# Patient Record
Sex: Female | Born: 1955 | Race: White | Hispanic: No | Smoking: Former smoker
Health system: Southern US, Community
[De-identification: ages and names within clinical notes are randomized; demographics above are authoritative.]

## PROBLEM LIST (undated history)

## (undated) DIAGNOSIS — Z720 Tobacco use: Secondary | ICD-10-CM

## (undated) DIAGNOSIS — M1990 Unspecified osteoarthritis, unspecified site: Secondary | ICD-10-CM

## (undated) DIAGNOSIS — T4145XA Adverse effect of unspecified anesthetic, initial encounter: Secondary | ICD-10-CM

## (undated) DIAGNOSIS — E278 Other specified disorders of adrenal gland: Secondary | ICD-10-CM

## (undated) DIAGNOSIS — E119 Type 2 diabetes mellitus without complications: Secondary | ICD-10-CM

## (undated) DIAGNOSIS — M797 Fibromyalgia: Secondary | ICD-10-CM

## (undated) DIAGNOSIS — R200 Anesthesia of skin: Secondary | ICD-10-CM

## (undated) DIAGNOSIS — I341 Nonrheumatic mitral (valve) prolapse: Secondary | ICD-10-CM

## (undated) DIAGNOSIS — F419 Anxiety disorder, unspecified: Secondary | ICD-10-CM

## (undated) DIAGNOSIS — E559 Vitamin D deficiency, unspecified: Secondary | ICD-10-CM

## (undated) DIAGNOSIS — R42 Dizziness and giddiness: Secondary | ICD-10-CM

## (undated) DIAGNOSIS — F410 Panic disorder [episodic paroxysmal anxiety] without agoraphobia: Secondary | ICD-10-CM

## (undated) DIAGNOSIS — T8859XA Other complications of anesthesia, initial encounter: Secondary | ICD-10-CM

## (undated) DIAGNOSIS — K8020 Calculus of gallbladder without cholecystitis without obstruction: Secondary | ICD-10-CM

## (undated) DIAGNOSIS — E039 Hypothyroidism, unspecified: Secondary | ICD-10-CM

## (undated) DIAGNOSIS — R202 Paresthesia of skin: Secondary | ICD-10-CM

## (undated) DIAGNOSIS — I493 Ventricular premature depolarization: Secondary | ICD-10-CM

## (undated) DIAGNOSIS — C801 Malignant (primary) neoplasm, unspecified: Secondary | ICD-10-CM

## (undated) DIAGNOSIS — Z8719 Personal history of other diseases of the digestive system: Secondary | ICD-10-CM

## (undated) DIAGNOSIS — K219 Gastro-esophageal reflux disease without esophagitis: Secondary | ICD-10-CM

## (undated) DIAGNOSIS — D496 Neoplasm of unspecified behavior of brain: Secondary | ICD-10-CM

## (undated) DIAGNOSIS — Z973 Presence of spectacles and contact lenses: Secondary | ICD-10-CM

## (undated) DIAGNOSIS — R351 Nocturia: Secondary | ICD-10-CM

## (undated) DIAGNOSIS — I10 Essential (primary) hypertension: Secondary | ICD-10-CM

## (undated) DIAGNOSIS — R569 Unspecified convulsions: Secondary | ICD-10-CM

## (undated) DIAGNOSIS — Z8489 Family history of other specified conditions: Secondary | ICD-10-CM

## (undated) HISTORY — DX: Type 2 diabetes mellitus without complications: E11.9

## (undated) HISTORY — DX: Fibromyalgia: M79.7

## (undated) HISTORY — DX: Vitamin D deficiency, unspecified: E55.9

## (undated) HISTORY — DX: Hypothyroidism, unspecified: E03.9

## (undated) HISTORY — PX: OTHER SURGICAL HISTORY: SHX169

## (undated) HISTORY — DX: Anxiety disorder, unspecified: F41.9

## (undated) HISTORY — DX: Nonrheumatic mitral (valve) prolapse: I34.1

## (undated) HISTORY — PX: MULTIPLE TOOTH EXTRACTIONS: SHX2053

---

## 1998-01-03 ENCOUNTER — Other Ambulatory Visit: Admission: RE | Admit: 1998-01-03 | Discharge: 1998-01-03 | Payer: Self-pay | Admitting: Obstetrics and Gynecology

## 1999-01-05 ENCOUNTER — Other Ambulatory Visit: Admission: RE | Admit: 1999-01-05 | Discharge: 1999-01-05 | Payer: Self-pay | Admitting: Obstetrics and Gynecology

## 2000-01-25 ENCOUNTER — Other Ambulatory Visit: Admission: RE | Admit: 2000-01-25 | Discharge: 2000-01-25 | Payer: Self-pay | Admitting: Obstetrics and Gynecology

## 2000-03-01 ENCOUNTER — Emergency Department (HOSPITAL_COMMUNITY): Admission: EM | Admit: 2000-03-01 | Discharge: 2000-03-01 | Payer: Self-pay | Admitting: Emergency Medicine

## 2000-03-01 ENCOUNTER — Encounter: Payer: Self-pay | Admitting: Emergency Medicine

## 2000-12-08 ENCOUNTER — Ambulatory Visit (HOSPITAL_COMMUNITY): Admission: RE | Admit: 2000-12-08 | Discharge: 2000-12-08 | Payer: Self-pay | Admitting: *Deleted

## 2000-12-08 ENCOUNTER — Encounter: Payer: Self-pay | Admitting: *Deleted

## 2000-12-09 ENCOUNTER — Ambulatory Visit (HOSPITAL_COMMUNITY): Admission: RE | Admit: 2000-12-09 | Discharge: 2000-12-09 | Payer: Self-pay | Admitting: *Deleted

## 2000-12-09 ENCOUNTER — Encounter: Payer: Self-pay | Admitting: *Deleted

## 2001-04-03 ENCOUNTER — Encounter: Payer: Self-pay | Admitting: Emergency Medicine

## 2001-04-03 ENCOUNTER — Emergency Department (HOSPITAL_COMMUNITY): Admission: EM | Admit: 2001-04-03 | Discharge: 2001-04-03 | Payer: Self-pay | Admitting: Emergency Medicine

## 2001-05-08 ENCOUNTER — Encounter (INDEPENDENT_AMBULATORY_CARE_PROVIDER_SITE_OTHER): Payer: Self-pay | Admitting: Gastroenterology

## 2001-05-08 DIAGNOSIS — K449 Diaphragmatic hernia without obstruction or gangrene: Secondary | ICD-10-CM | POA: Insufficient documentation

## 2001-07-07 ENCOUNTER — Other Ambulatory Visit: Admission: RE | Admit: 2001-07-07 | Discharge: 2001-07-07 | Payer: Self-pay | Admitting: Obstetrics and Gynecology

## 2001-07-28 ENCOUNTER — Encounter: Payer: Self-pay | Admitting: Gastroenterology

## 2001-07-28 ENCOUNTER — Encounter: Admission: RE | Admit: 2001-07-28 | Discharge: 2001-07-28 | Payer: Self-pay | Admitting: Gastroenterology

## 2001-10-27 ENCOUNTER — Encounter: Payer: Self-pay | Admitting: Gastroenterology

## 2001-10-27 ENCOUNTER — Ambulatory Visit (HOSPITAL_COMMUNITY): Admission: RE | Admit: 2001-10-27 | Discharge: 2001-10-27 | Payer: Self-pay | Admitting: Gastroenterology

## 2001-12-29 ENCOUNTER — Encounter: Payer: Self-pay | Admitting: Otolaryngology

## 2001-12-29 ENCOUNTER — Encounter: Admission: RE | Admit: 2001-12-29 | Discharge: 2001-12-29 | Payer: Self-pay | Admitting: Otolaryngology

## 2002-01-29 ENCOUNTER — Encounter: Admission: RE | Admit: 2002-01-29 | Discharge: 2002-01-29 | Payer: Self-pay | Admitting: Obstetrics and Gynecology

## 2002-01-29 ENCOUNTER — Encounter: Payer: Self-pay | Admitting: Obstetrics and Gynecology

## 2002-06-07 ENCOUNTER — Ambulatory Visit (HOSPITAL_COMMUNITY): Admission: RE | Admit: 2002-06-07 | Discharge: 2002-06-07 | Payer: Self-pay | Admitting: *Deleted

## 2002-06-07 ENCOUNTER — Encounter: Payer: Self-pay | Admitting: *Deleted

## 2002-06-15 ENCOUNTER — Ambulatory Visit (HOSPITAL_COMMUNITY): Admission: RE | Admit: 2002-06-15 | Discharge: 2002-06-15 | Payer: Self-pay | Admitting: Family Medicine

## 2002-06-15 ENCOUNTER — Encounter: Payer: Self-pay | Admitting: Family Medicine

## 2002-07-02 ENCOUNTER — Emergency Department (HOSPITAL_COMMUNITY): Admission: EM | Admit: 2002-07-02 | Discharge: 2002-07-02 | Payer: Self-pay | Admitting: Emergency Medicine

## 2002-07-02 ENCOUNTER — Encounter: Payer: Self-pay | Admitting: Emergency Medicine

## 2002-08-03 ENCOUNTER — Other Ambulatory Visit: Admission: RE | Admit: 2002-08-03 | Discharge: 2002-08-03 | Payer: Self-pay | Admitting: Obstetrics and Gynecology

## 2003-01-17 ENCOUNTER — Encounter: Admission: RE | Admit: 2003-01-17 | Discharge: 2003-03-02 | Payer: Self-pay | Admitting: Rheumatology

## 2003-08-11 ENCOUNTER — Other Ambulatory Visit: Admission: RE | Admit: 2003-08-11 | Discharge: 2003-08-11 | Payer: Self-pay | Admitting: Obstetrics and Gynecology

## 2004-06-11 ENCOUNTER — Ambulatory Visit (HOSPITAL_COMMUNITY): Admission: RE | Admit: 2004-06-11 | Discharge: 2004-06-11 | Payer: Self-pay | Admitting: Family Medicine

## 2004-10-26 ENCOUNTER — Ambulatory Visit (HOSPITAL_COMMUNITY): Admission: RE | Admit: 2004-10-26 | Discharge: 2004-10-26 | Payer: Self-pay | Admitting: Neurology

## 2006-06-23 ENCOUNTER — Emergency Department (HOSPITAL_COMMUNITY): Admission: EM | Admit: 2006-06-23 | Discharge: 2006-06-23 | Payer: Self-pay | Admitting: Emergency Medicine

## 2007-06-16 ENCOUNTER — Ambulatory Visit (HOSPITAL_COMMUNITY): Admission: RE | Admit: 2007-06-16 | Discharge: 2007-06-16 | Payer: Self-pay | Admitting: Internal Medicine

## 2007-06-17 ENCOUNTER — Ambulatory Visit: Payer: Self-pay | Admitting: Internal Medicine

## 2007-11-05 DIAGNOSIS — I059 Rheumatic mitral valve disease, unspecified: Secondary | ICD-10-CM | POA: Insufficient documentation

## 2007-11-05 DIAGNOSIS — IMO0001 Reserved for inherently not codable concepts without codable children: Secondary | ICD-10-CM

## 2007-11-05 DIAGNOSIS — I499 Cardiac arrhythmia, unspecified: Secondary | ICD-10-CM | POA: Insufficient documentation

## 2007-11-05 DIAGNOSIS — G43909 Migraine, unspecified, not intractable, without status migrainosus: Secondary | ICD-10-CM | POA: Insufficient documentation

## 2007-11-05 DIAGNOSIS — I10 Essential (primary) hypertension: Secondary | ICD-10-CM | POA: Insufficient documentation

## 2007-11-05 DIAGNOSIS — K602 Anal fissure, unspecified: Secondary | ICD-10-CM

## 2007-11-05 DIAGNOSIS — F410 Panic disorder [episodic paroxysmal anxiety] without agoraphobia: Secondary | ICD-10-CM

## 2008-06-16 ENCOUNTER — Telehealth: Payer: Self-pay | Admitting: Internal Medicine

## 2008-06-17 DIAGNOSIS — K589 Irritable bowel syndrome without diarrhea: Secondary | ICD-10-CM | POA: Insufficient documentation

## 2008-06-17 DIAGNOSIS — Z872 Personal history of diseases of the skin and subcutaneous tissue: Secondary | ICD-10-CM | POA: Insufficient documentation

## 2008-06-17 DIAGNOSIS — E039 Hypothyroidism, unspecified: Secondary | ICD-10-CM

## 2008-06-17 DIAGNOSIS — K219 Gastro-esophageal reflux disease without esophagitis: Secondary | ICD-10-CM | POA: Insufficient documentation

## 2008-06-20 ENCOUNTER — Ambulatory Visit: Payer: Self-pay | Admitting: Internal Medicine

## 2008-06-22 ENCOUNTER — Encounter: Payer: Self-pay | Admitting: Internal Medicine

## 2008-06-22 ENCOUNTER — Ambulatory Visit: Payer: Self-pay | Admitting: Internal Medicine

## 2008-06-23 ENCOUNTER — Encounter: Payer: Self-pay | Admitting: Internal Medicine

## 2008-06-30 ENCOUNTER — Telehealth: Payer: Self-pay | Admitting: Internal Medicine

## 2009-07-28 ENCOUNTER — Ambulatory Visit: Admission: RE | Admit: 2009-07-28 | Discharge: 2009-07-28 | Payer: Self-pay | Admitting: Family Medicine

## 2010-01-24 ENCOUNTER — Encounter: Admission: RE | Admit: 2010-01-24 | Discharge: 2010-01-24 | Payer: Self-pay | Admitting: Family Medicine

## 2010-02-02 ENCOUNTER — Encounter: Admission: RE | Admit: 2010-02-02 | Discharge: 2010-02-02 | Payer: Self-pay | Admitting: General Surgery

## 2010-09-22 ENCOUNTER — Encounter: Payer: Self-pay | Admitting: Family Medicine

## 2010-11-25 ENCOUNTER — Emergency Department (HOSPITAL_COMMUNITY): Payer: BC Managed Care – PPO

## 2010-11-25 ENCOUNTER — Emergency Department (HOSPITAL_COMMUNITY)
Admission: EM | Admit: 2010-11-25 | Discharge: 2010-11-25 | Disposition: A | Discharge status: Home / Self Care | Payer: BC Managed Care – PPO | Attending: Emergency Medicine | Admitting: Emergency Medicine

## 2010-11-25 DIAGNOSIS — R002 Palpitations: Secondary | ICD-10-CM | POA: Insufficient documentation

## 2010-11-25 DIAGNOSIS — R209 Unspecified disturbances of skin sensation: Secondary | ICD-10-CM | POA: Insufficient documentation

## 2010-11-25 DIAGNOSIS — I10 Essential (primary) hypertension: Secondary | ICD-10-CM | POA: Insufficient documentation

## 2010-11-25 DIAGNOSIS — E039 Hypothyroidism, unspecified: Secondary | ICD-10-CM | POA: Insufficient documentation

## 2010-11-25 DIAGNOSIS — IMO0001 Reserved for inherently not codable concepts without codable children: Secondary | ICD-10-CM | POA: Insufficient documentation

## 2010-11-25 LAB — POCT I-STAT, CHEM 8
BUN: 12 mg/dL (ref 6–23)
Calcium, Ion: 1.18 mmol/L (ref 1.12–1.32)
Chloride: 103 mEq/L (ref 96–112)
Creatinine, Ser: 1 mg/dL (ref 0.4–1.2)
Glucose, Bld: 95 mg/dL (ref 70–99)
HCT: 44 % (ref 36.0–46.0)
Hemoglobin: 15 g/dL (ref 12.0–15.0)
Potassium: 4 mEq/L (ref 3.5–5.1)
Sodium: 140 mEq/L (ref 135–145)
TCO2: 28 mmol/L (ref 0–100)

## 2011-01-15 NOTE — Assessment & Plan Note (Signed)
[facility]                         GASTROENTEROLOGY OFFICE NOTE   Michelle Duran, Michelle Duran                       MRN:          [id number]  DATE:06/17/2007                            DOB:          [birth year]    NEW PATIENT ADD-ON   PRIMARY PHYSICIAN:  Dr. Merri Brunette   REFERRING PHYSICIAN:  Self-referred.   PROBLEM:  Lump in throat.   HISTORY:  Michelle Duran is a pleasant 55 year old white female known remotely to  Dr. Juanda Chance.  She has also been seen by Dr. Victorino Dike.  She was last in  our office in 2002, had endoscopy done May 08, 2001, per Dr. Victorino Dike which did show a 3-cm hiatal hernia, mild esophagitis.  She was  empirically dilated to #54 because of some complaints of dysphagia.  She  had flexible sigmoidoscopy in 1985 which was normal.  She says she had  been on long-term AcipHex but had switched more recently for insurance  reasons to over-the-counter Prilosec.  She says over the past month she  has been having increasing difficulty with swallowing and feeling a  lump sensation in her throat.  She says that she has definitely been  under more stress over the past month and is not sure whether or not  this is related.  As long as she is taking her Prilosec she is not  having any heartburn, but does say that she has a lot of postnasal drip,  frequent throat-clearing, and some hoarseness.  She has had some  episodes, especially with meat, feeling as if the food is sticking and  gradually will go down.  She has not had any episodes of regurgitation,  occasionally feels that she has difficulty with pills as well.  She is  not having symptoms with ever meal.   She has long-term alternating bowel habits with constipation and  diarrhea.  No melena or hematochezia.  She has not had full colonoscopy.   CURRENT MEDICATIONS:  1. Prilosec OTC 20 mg daily.  2. Synthroid 50 mcg one-half tablet daily.  3. Alprazolam, she is uncertain of the milligram  dosage, daily.  4. Zoloft 100 mg daily.   ALLERGIES:  1. SULFA which causes nausea.  2. CODEINE with vomiting.   PAST HISTORY:  1. Fibromyalgia.  2. Hypertension.  3. Hypothyroidism.  4. Anxiety and panic disorder.   She has not had any previous abdominal surgeries.   FAMILY HISTORY:  Positive for heart disease and diabetes.  Her mother  did have colon polyps.  No family history of colon cancer.   SOCIAL HISTORY:  The patient is divorced, lives with her mother and her  son.  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  She is a smoker.  Denies any EtOH.   REVIEW OF SYSTEMS:  Pertinent for allergy sinus symptoms, low-back pain,  intermittent hoarseness, and has occasional stress urinary incontinence.  Complete review of systems otherwise negative.   PHYSICAL EXAMINATION:  Well-developed, white female in no acute  distress.  Height is 5 feet 2 inches, weight  is 193.  Blood pressure  142/84, pulse is 78.  CARDIOVASCULAR:  Regular rate and rhythm with S1 and S2.  No murmur, rub  or gallop.  PULMONARY:  Clear to A&P.  HEENT:  Nontraumatic, normocephalic.  EOMI, PERLA, sclerae anicteric.  NECK:  Supple.  No JVD.  ABDOMEN:  Soft and nontender.  There is no palpable mass or  hepatosplenomegaly.  No guarding or rebound.  Bowel sounds active.  RECTAL EXAM:  Not done today.   Barium swallow done June 16, 2007, shows small sliding hiatal hernia,  no evidence of stricture or esophagitis.  There was moderate dysmotility  with intermittent tertiary contractions.   IMPRESSION:  16. A 55 year old white female with chronic gastroesophageal reflux      disease and intermittent dysphagia, no evidence of stricture on      barium swallow.  She may have a component of esophagitis and      underlying nonspecific motility disorder.  2. Colon neoplasia surveillance.  The patient has not had screening      colonoscopy.   PLAN:  1. Increase  Prilosec to b.i.d. and institute antireflux regimen.  She      will follow up with Dr. Juanda Chance in one month and if her symptoms      have not improved at that point will probably      need upper endoscopy.  2. Schedule screening colonoscopy at her convenience.      Mike Gip, PA-C  Electronically Signed      Wilhemina Bonito. Marina Goodell, MD  Electronically Signed   AE/MedQ  DD: 06/17/2007  DT: 06/18/2007  Job #: [id number]

## 2011-06-10 ENCOUNTER — Encounter: Payer: BC Managed Care – PPO | Admitting: Oncology

## 2011-06-20 ENCOUNTER — Other Ambulatory Visit: Payer: Self-pay | Admitting: Oncology

## 2011-06-20 ENCOUNTER — Encounter (HOSPITAL_BASED_OUTPATIENT_CLINIC_OR_DEPARTMENT_OTHER): Payer: BC Managed Care – PPO | Admitting: Oncology

## 2011-06-20 DIAGNOSIS — IMO0001 Reserved for inherently not codable concepts without codable children: Secondary | ICD-10-CM

## 2011-06-20 DIAGNOSIS — D72820 Lymphocytosis (symptomatic): Secondary | ICD-10-CM

## 2011-06-20 LAB — COMPREHENSIVE METABOLIC PANEL
ALT: 29 U/L (ref 0–35)
AST: 24 U/L (ref 0–37)
Albumin: 4.2 g/dL (ref 3.5–5.2)
Alkaline Phosphatase: 105 U/L (ref 39–117)
BUN: 13 mg/dL (ref 6–23)
CO2: 24 mEq/L (ref 19–32)
Calcium: 9.2 mg/dL (ref 8.4–10.5)
Chloride: 105 mEq/L (ref 96–112)
Creatinine, Ser: 0.9 mg/dL (ref 0.50–1.10)
Glucose, Bld: 128 mg/dL — ABNORMAL HIGH (ref 70–99)
Potassium: 4 mEq/L (ref 3.5–5.3)
Sodium: 140 mEq/L (ref 135–145)
Total Bilirubin: 0.4 mg/dL (ref 0.3–1.2)
Total Protein: 6.5 g/dL (ref 6.0–8.3)

## 2011-06-20 LAB — CBC WITH DIFFERENTIAL/PLATELET
BASO%: 0.9 % (ref 0.0–2.0)
Basophils Absolute: 0.1 10*3/uL (ref 0.0–0.1)
EOS%: 1 % (ref 0.0–7.0)
Eosinophils Absolute: 0.1 10*3/uL (ref 0.0–0.5)
HCT: 43.6 % (ref 34.8–46.6)
HGB: 14.9 g/dL (ref 11.6–15.9)
LYMPH%: 41 % (ref 14.0–49.7)
MCH: 31.5 pg (ref 25.1–34.0)
MCHC: 34.3 g/dL (ref 31.5–36.0)
MCV: 91.8 fL (ref 79.5–101.0)
MONO#: 0.4 10*3/uL (ref 0.1–0.9)
MONO%: 4.9 % (ref 0.0–14.0)
NEUT#: 4.1 10*3/uL (ref 1.5–6.5)
NEUT%: 52.2 % (ref 38.4–76.8)
Platelets: 327 10*3/uL (ref 145–400)
RBC: 4.75 10*6/uL (ref 3.70–5.45)
RDW: 12.7 % (ref 11.2–14.5)
WBC: 7.9 10*3/uL (ref 3.9–10.3)
lymph#: 3.2 10*3/uL (ref 0.9–3.3)

## 2011-06-20 LAB — CHCC SMEAR

## 2011-06-20 LAB — LACTATE DEHYDROGENASE: LDH: 158 U/L (ref 94–250)

## 2011-06-20 LAB — MORPHOLOGY
PLT EST: ADEQUATE
RBC Comments: NORMAL

## 2011-06-20 LAB — URIC ACID: Uric Acid, Serum: 5.3 mg/dL (ref 2.4–7.0)

## 2011-10-05 ENCOUNTER — Telehealth: Payer: Self-pay | Admitting: Oncology

## 2011-10-05 NOTE — Telephone Encounter (Signed)
lmonvm for pt re appts for feb/may/oct. Schedule mailed.

## 2011-10-21 ENCOUNTER — Other Ambulatory Visit: Payer: BC Managed Care – PPO | Admitting: Lab

## 2012-01-17 ENCOUNTER — Other Ambulatory Visit: Payer: BC Managed Care – PPO | Admitting: Lab

## 2012-04-03 ENCOUNTER — Emergency Department (HOSPITAL_COMMUNITY)
Admission: EM | Admit: 2012-04-03 | Discharge: 2012-04-03 | Disposition: A | Discharge status: Home / Self Care | Payer: BC Managed Care – PPO | Attending: Emergency Medicine | Admitting: Emergency Medicine

## 2012-04-03 ENCOUNTER — Encounter (HOSPITAL_COMMUNITY): Payer: Self-pay | Admitting: *Deleted

## 2012-04-03 DIAGNOSIS — R1011 Right upper quadrant pain: Secondary | ICD-10-CM | POA: Insufficient documentation

## 2012-04-03 DIAGNOSIS — I10 Essential (primary) hypertension: Secondary | ICD-10-CM | POA: Insufficient documentation

## 2012-04-03 DIAGNOSIS — F17200 Nicotine dependence, unspecified, uncomplicated: Secondary | ICD-10-CM | POA: Insufficient documentation

## 2012-04-03 DIAGNOSIS — R109 Unspecified abdominal pain: Secondary | ICD-10-CM

## 2012-04-03 DIAGNOSIS — Z882 Allergy status to sulfonamides status: Secondary | ICD-10-CM | POA: Insufficient documentation

## 2012-04-03 DIAGNOSIS — K8050 Calculus of bile duct without cholangitis or cholecystitis without obstruction: Secondary | ICD-10-CM

## 2012-04-03 HISTORY — DX: Calculus of gallbladder without cholecystitis without obstruction: K80.20

## 2012-04-03 HISTORY — DX: Essential (primary) hypertension: I10

## 2012-04-03 LAB — COMPREHENSIVE METABOLIC PANEL
ALT: 19 U/L (ref 0–35)
AST: 17 U/L (ref 0–37)
Albumin: 3.7 g/dL (ref 3.5–5.2)
Alkaline Phosphatase: 94 U/L (ref 39–117)
BUN: 13 mg/dL (ref 6–23)
CO2: 29 mEq/L (ref 19–32)
Calcium: 9.5 mg/dL (ref 8.4–10.5)
Chloride: 102 mEq/L (ref 96–112)
Creatinine, Ser: 0.84 mg/dL (ref 0.50–1.10)
GFR calc Af Amer: 89 mL/min — ABNORMAL LOW (ref >90)
GFR calc non Af Amer: 77 mL/min — ABNORMAL LOW (ref >90)
Glucose, Bld: 116 mg/dL — ABNORMAL HIGH (ref 70–99)
Potassium: 3.7 mEq/L (ref 3.5–5.1)
Sodium: 140 mEq/L (ref 135–145)
Total Bilirubin: 0.2 mg/dL — ABNORMAL LOW (ref 0.3–1.2)
Total Protein: 6.5 g/dL (ref 6.0–8.3)

## 2012-04-03 LAB — URINALYSIS, ROUTINE W REFLEX MICROSCOPIC
Bilirubin Urine: NEGATIVE
Glucose, UA: NEGATIVE mg/dL
Hgb urine dipstick: NEGATIVE
Ketones, ur: NEGATIVE mg/dL
Leukocytes, UA: NEGATIVE
Nitrite: NEGATIVE
Protein, ur: NEGATIVE mg/dL
Specific Gravity, Urine: 1.021 (ref 1.005–1.030)
Urobilinogen, UA: 0.2 mg/dL (ref 0.0–1.0)
pH: 5.5 (ref 5.0–8.0)

## 2012-04-03 LAB — CBC WITH DIFFERENTIAL/PLATELET
Basophils Absolute: 0 10*3/uL (ref 0.0–0.1)
Basophils Relative: 0 % (ref 0–1)
Eosinophils Absolute: 0.1 10*3/uL (ref 0.0–0.7)
Eosinophils Relative: 1 % (ref 0–5)
HCT: 40.6 % (ref 36.0–46.0)
Hemoglobin: 14 g/dL (ref 12.0–15.0)
Lymphocytes Relative: 37 % (ref 12–46)
Lymphs Abs: 4.5 10*3/uL — ABNORMAL HIGH (ref 0.7–4.0)
MCH: 31.1 pg (ref 26.0–34.0)
MCHC: 34.5 g/dL (ref 30.0–36.0)
MCV: 90.2 fL (ref 78.0–100.0)
Monocytes Absolute: 0.9 10*3/uL (ref 0.1–1.0)
Monocytes Relative: 7 % (ref 3–12)
Neutro Abs: 6.6 10*3/uL (ref 1.7–7.7)
Neutrophils Relative %: 55 % (ref 43–77)
Platelets: 341 10*3/uL (ref 150–400)
RBC: 4.5 MIL/uL (ref 3.87–5.11)
RDW: 13 % (ref 11.5–15.5)
WBC: 12.1 10*3/uL — ABNORMAL HIGH (ref 4.0–10.5)

## 2012-04-03 LAB — LIPASE, BLOOD: Lipase: 28 U/L (ref 11–59)

## 2012-04-03 MED ORDER — ONDANSETRON HCL 4 MG PO TABS: 4.0000 mg | ORAL_TABLET | Freq: Four times a day (QID) | ORAL | Status: AC

## 2012-04-03 MED ORDER — HYDROCODONE-ACETAMINOPHEN 5-325 MG PO TABS: 1.0000 | ORAL_TABLET | ORAL | Status: AC | PRN

## 2012-04-03 NOTE — ED Notes (Signed)
Pt states she is having abdominal pain. Pt states pain is radiating to her back. No c/o n/v. No c/o dark stool.pt states she does have a history of gallstones

## 2012-04-03 NOTE — ED Provider Notes (Signed)
History     CSN: [id number]  Arrival date & time 04/03/12  1639   First MD Initiated Contact with Patient 04/03/12 1721      Chief Complaint  Patient presents with  . Abdominal Pain    (Consider location/radiation/quality/duration/timing/severity/associated sxs/prior treatment) Patient is a 56 y.o. female presenting with abdominal pain. The history is provided by the patient.  Abdominal Pain The primary symptoms of the illness include abdominal pain. The primary symptoms of the illness do not include fever, shortness of breath, nausea, vomiting or dysuria. The current episode started 6 to 12 hours ago. The onset of the illness was gradual. The problem has not changed since onset. The abdominal pain is located in the RUQ. The abdominal pain radiates to the back. The abdominal pain is relieved by nothing.  Associated symptoms comments: She reports diagnosis of gall stone by ultrasound last year and has had no recent or frequent episodes of pain. Last night she ate a fatty meal and pain returned this morning. Mild nausea at times but no vomiting. No fever. .    Past Medical History  Diagnosis Date  . Gallstone   . Hypertension     History reviewed. No pertinent past surgical history.  No family history on file.  History  Substance Use Topics  . Smoking status: Current Everyday Smoker  . Smokeless tobacco: Not on file  . Alcohol Use: No    OB History    Grav Para Term Preterm Abortions TAB SAB Ect Mult Living                  Review of Systems  Constitutional: Negative for fever.  Respiratory: Negative for cough and shortness of breath.   Cardiovascular: Negative for chest pain.  Gastrointestinal: Positive for abdominal pain. Negative for nausea and vomiting.  Genitourinary: Negative for dysuria.    Allergies  Codeine and Sulfonamide derivatives  Home Medications   Current Outpatient Rx  Name Route Sig Dispense Refill  . ALPRAZOLAM 0.5 MG PO TABS Oral Take 0.25  mg by mouth 2 (two) times daily.    Marland Kitchen LEVOTHYROXINE SODIUM 50 MCG PO TABS Oral Take 25 mcg by mouth daily.    Marland Kitchen LISINOPRIL 5 MG PO TABS Oral Take 5 mg by mouth every evening.    Marland Kitchen METOPROLOL SUCCINATE ER 25 MG PO TB24 Oral Take 12.5 mg by mouth every evening.    Marland Kitchen OMEPRAZOLE 20 MG PO CPDR Oral Take 20 mg by mouth daily.    . SERTRALINE HCL 100 MG PO TABS Oral Take 100 mg by mouth every evening.      BP 145/58  Pulse 77  Temp 98.3 F (36.8 C) (Oral)  Resp 20  SpO2 98%  Physical Exam  Constitutional: She appears well-developed and well-nourished.  HENT:  Head: Normocephalic.  Neck: Normal range of motion. Neck supple.  Cardiovascular: Normal rate and regular rhythm.   Pulmonary/Chest: Effort normal and breath sounds normal.  Abdominal: Soft. Bowel sounds are normal. There is tenderness. There is no rebound and no guarding.       Mild RUQ tenderness without rebound or guarding.   Musculoskeletal: Normal range of motion.  Neurological: She is alert.  Skin: Skin is warm and dry. No rash noted.  Psychiatric: She has a normal mood and affect.    ED Course  Procedures (including critical care time)   Labs Reviewed  CBC WITH DIFFERENTIAL  COMPREHENSIVE METABOLIC PANEL  LIPASE, BLOOD  URINALYSIS, ROUTINE W REFLEX MICROSCOPIC  No results found. Results for orders placed during the hospital encounter of 04/03/12  CBC WITH DIFFERENTIAL      Component Value Range   WBC 12.1 (*) 4.0 - 10.5 K/uL   RBC 4.50  3.87 - 5.11 MIL/uL   Hemoglobin 14.0  12.0 - 15.0 g/dL   HCT 40.9  81.1 - 91.4 %   MCV 90.2  78.0 - 100.0 fL   MCH 31.1  26.0 - 34.0 pg   MCHC 34.5  30.0 - 36.0 g/dL   RDW 78.2  95.6 - 21.3 %   Platelets 341  150 - 400 K/uL   Neutrophils Relative 55  43 - 77 %   Neutro Abs 6.6  1.7 - 7.7 K/uL   Lymphocytes Relative 37  12 - 46 %   Lymphs Abs 4.5 (*) 0.7 - 4.0 K/uL   Monocytes Relative 7  3 - 12 %   Monocytes Absolute 0.9  0.1 - 1.0 K/uL   Eosinophils Relative 1  0 - 5 %    Eosinophils Absolute 0.1  0.0 - 0.7 K/uL   Basophils Relative 0  0 - 1 %   Basophils Absolute 0.0  0.0 - 0.1 K/uL  COMPREHENSIVE METABOLIC PANEL      Component Value Range   Sodium 140  135 - 145 mEq/L   Potassium 3.7  3.5 - 5.1 mEq/L   Chloride 102  96 - 112 mEq/L   CO2 29  19 - 32 mEq/L   Glucose, Bld 116 (*) 70 - 99 mg/dL   BUN 13  6 - 23 mg/dL   Creatinine, Ser 0.86  0.50 - 1.10 mg/dL   Calcium 9.5  8.4 - 57.8 mg/dL   Total Protein 6.5  6.0 - 8.3 g/dL   Albumin 3.7  3.5 - 5.2 g/dL   AST 17  0 - 37 U/L   ALT 19  0 - 35 U/L   Alkaline Phosphatase 94  39 - 117 U/L   Total Bilirubin 0.2 (*) 0.3 - 1.2 mg/dL   GFR calc non Af Amer 77 (*) >90 mL/min   GFR calc Af Amer 89 (*) >90 mL/min  LIPASE, BLOOD      Component Value Range   Lipase 28  11 - 59 U/L  URINALYSIS, ROUTINE W REFLEX MICROSCOPIC      Component Value Range   Color, Urine YELLOW  YELLOW   APPearance CLEAR  CLEAR   Specific Gravity, Urine 1.021  1.005 - 1.030   pH 5.5  5.0 - 8.0   Glucose, UA NEGATIVE  NEGATIVE mg/dL   Hgb urine dipstick NEGATIVE  NEGATIVE   Bilirubin Urine NEGATIVE  NEGATIVE   Ketones, ur NEGATIVE  NEGATIVE mg/dL   Protein, ur NEGATIVE  NEGATIVE mg/dL   Urobilinogen, UA 0.2  0.0 - 1.0 mg/dL   Nitrite NEGATIVE  NEGATIVE   Leukocytes, UA NEGATIVE  NEGATIVE    .No diagnosis found.  1. Abdominal pain, resolved 2. History of gall stones  MDM  Re-eval:  Patient declined pain medications while here. On re-evaluation she states she feels better without intervention and is currently nontender to palpation, which is improved since arrival. Lab studies essentially normal. Feel she is stable for discharge.        Rodena Medin, PA-C 04/03/12 1944

## 2012-04-04 NOTE — ED Provider Notes (Signed)
Medical screening examination/treatment/procedure(s) were performed by non-physician practitioner and as supervising physician I was immediately available for consultation/collaboration.  Doug Sou, MD 04/04/12 0100

## 2012-06-18 ENCOUNTER — Other Ambulatory Visit: Payer: BC Managed Care – PPO | Admitting: Lab

## 2012-06-18 ENCOUNTER — Ambulatory Visit: Payer: BC Managed Care – PPO | Admitting: Oncology

## 2014-09-28 ENCOUNTER — Other Ambulatory Visit: Payer: Self-pay | Admitting: Family Medicine

## 2014-09-28 ENCOUNTER — Other Ambulatory Visit (HOSPITAL_COMMUNITY)
Admission: RE | Admit: 2014-09-28 | Discharge: 2014-09-28 | Disposition: A | Discharge status: Home / Self Care | Payer: BC Managed Care – PPO | Source: Ambulatory Visit | Attending: Family Medicine | Admitting: Family Medicine

## 2014-09-28 DIAGNOSIS — Z01419 Encounter for gynecological examination (general) (routine) without abnormal findings: Secondary | ICD-10-CM | POA: Insufficient documentation

## 2014-09-30 ENCOUNTER — Other Ambulatory Visit: Payer: Self-pay | Admitting: Family Medicine

## 2014-09-30 DIAGNOSIS — R1011 Right upper quadrant pain: Secondary | ICD-10-CM

## 2014-09-30 LAB — CYTOLOGY - PAP

## 2014-10-10 ENCOUNTER — Other Ambulatory Visit: Payer: BC Managed Care – PPO

## 2015-07-24 ENCOUNTER — Emergency Department (HOSPITAL_COMMUNITY): Payer: BC Managed Care – PPO

## 2015-07-24 ENCOUNTER — Encounter (HOSPITAL_COMMUNITY): Payer: Self-pay | Admitting: *Deleted

## 2015-07-24 ENCOUNTER — Inpatient Hospital Stay (HOSPITAL_COMMUNITY)
Admission: EM | Admit: 2015-07-24 | Discharge: 2015-07-27 | DRG: 070 | Disposition: A | Discharge status: Home / Self Care | Payer: BC Managed Care – PPO | Attending: Internal Medicine | Admitting: Internal Medicine

## 2015-07-24 DIAGNOSIS — E1165 Type 2 diabetes mellitus with hyperglycemia: Secondary | ICD-10-CM | POA: Diagnosis present

## 2015-07-24 DIAGNOSIS — Z720 Tobacco use: Secondary | ICD-10-CM | POA: Diagnosis present

## 2015-07-24 DIAGNOSIS — T380X5A Adverse effect of glucocorticoids and synthetic analogues, initial encounter: Secondary | ICD-10-CM | POA: Diagnosis not present

## 2015-07-24 DIAGNOSIS — E038 Other specified hypothyroidism: Secondary | ICD-10-CM | POA: Diagnosis not present

## 2015-07-24 DIAGNOSIS — E039 Hypothyroidism, unspecified: Secondary | ICD-10-CM | POA: Diagnosis present

## 2015-07-24 DIAGNOSIS — R4781 Slurred speech: Secondary | ICD-10-CM | POA: Diagnosis present

## 2015-07-24 DIAGNOSIS — F411 Generalized anxiety disorder: Secondary | ICD-10-CM | POA: Diagnosis not present

## 2015-07-24 DIAGNOSIS — F17210 Nicotine dependence, cigarettes, uncomplicated: Secondary | ICD-10-CM | POA: Diagnosis present

## 2015-07-24 DIAGNOSIS — F410 Panic disorder [episodic paroxysmal anxiety] without agoraphobia: Secondary | ICD-10-CM | POA: Diagnosis present

## 2015-07-24 DIAGNOSIS — R29810 Facial weakness: Secondary | ICD-10-CM | POA: Diagnosis present

## 2015-07-24 DIAGNOSIS — R001 Bradycardia, unspecified: Secondary | ICD-10-CM | POA: Diagnosis present

## 2015-07-24 DIAGNOSIS — M19071 Primary osteoarthritis, right ankle and foot: Secondary | ICD-10-CM | POA: Diagnosis present

## 2015-07-24 DIAGNOSIS — M4690 Unspecified inflammatory spondylopathy, site unspecified: Secondary | ICD-10-CM | POA: Diagnosis present

## 2015-07-24 DIAGNOSIS — E139 Other specified diabetes mellitus without complications: Secondary | ICD-10-CM | POA: Diagnosis present

## 2015-07-24 DIAGNOSIS — G939 Disorder of brain, unspecified: Secondary | ICD-10-CM | POA: Diagnosis present

## 2015-07-24 DIAGNOSIS — I10 Essential (primary) hypertension: Secondary | ICD-10-CM | POA: Diagnosis present

## 2015-07-24 DIAGNOSIS — C719 Malignant neoplasm of brain, unspecified: Secondary | ICD-10-CM | POA: Diagnosis present

## 2015-07-24 DIAGNOSIS — G936 Cerebral edema: Secondary | ICD-10-CM | POA: Diagnosis present

## 2015-07-24 DIAGNOSIS — F418 Other specified anxiety disorders: Secondary | ICD-10-CM | POA: Diagnosis present

## 2015-07-24 DIAGNOSIS — R1310 Dysphagia, unspecified: Secondary | ICD-10-CM | POA: Diagnosis present

## 2015-07-24 DIAGNOSIS — I059 Rheumatic mitral valve disease, unspecified: Secondary | ICD-10-CM | POA: Diagnosis present

## 2015-07-24 DIAGNOSIS — F329 Major depressive disorder, single episode, unspecified: Secondary | ICD-10-CM | POA: Diagnosis present

## 2015-07-24 DIAGNOSIS — K219 Gastro-esophageal reflux disease without esophagitis: Secondary | ICD-10-CM | POA: Diagnosis present

## 2015-07-24 DIAGNOSIS — M797 Fibromyalgia: Secondary | ICD-10-CM | POA: Diagnosis present

## 2015-07-24 DIAGNOSIS — F32A Depression, unspecified: Secondary | ICD-10-CM | POA: Diagnosis present

## 2015-07-24 DIAGNOSIS — G9389 Other specified disorders of brain: Secondary | ICD-10-CM

## 2015-07-24 HISTORY — DX: Tobacco use: Z72.0

## 2015-07-24 HISTORY — DX: Other specified disorders of adrenal gland: E27.8

## 2015-07-24 LAB — COMPREHENSIVE METABOLIC PANEL
ALK PHOS: 112 U/L (ref 38–126)
ALT: 22 U/L (ref 14–54)
AST: 21 U/L (ref 15–41)
Albumin: 3.7 g/dL (ref 3.5–5.0)
Anion gap: 8 (ref 5–15)
BILIRUBIN TOTAL: 0.2 mg/dL — AB (ref 0.3–1.2)
BUN: 12 mg/dL (ref 6–20)
CALCIUM: 9.1 mg/dL (ref 8.9–10.3)
CO2: 25 mmol/L (ref 22–32)
CREATININE: 0.94 mg/dL (ref 0.44–1.00)
Chloride: 106 mmol/L (ref 101–111)
Glucose, Bld: 142 mg/dL — ABNORMAL HIGH (ref 65–99)
Potassium: 4 mmol/L (ref 3.5–5.1)
Sodium: 139 mmol/L (ref 135–145)
TOTAL PROTEIN: 6.5 g/dL (ref 6.5–8.1)

## 2015-07-24 LAB — CBC
HEMATOCRIT: 42.2 % (ref 36.0–46.0)
Hemoglobin: 14.3 g/dL (ref 12.0–15.0)
MCH: 31.2 pg (ref 26.0–34.0)
MCHC: 33.9 g/dL (ref 30.0–36.0)
MCV: 92.1 fL (ref 78.0–100.0)
Platelets: 372 10*3/uL (ref 150–400)
RBC: 4.58 MIL/uL (ref 3.87–5.11)
RDW: 13 % (ref 11.5–15.5)
WBC: 11.8 10*3/uL — AB (ref 4.0–10.5)

## 2015-07-24 LAB — I-STAT CHEM 8, ED
BUN: 13 mg/dL (ref 6–20)
CALCIUM ION: 1.15 mmol/L (ref 1.12–1.23)
CHLORIDE: 104 mmol/L (ref 101–111)
Creatinine, Ser: 0.9 mg/dL (ref 0.44–1.00)
GLUCOSE: 130 mg/dL — AB (ref 65–99)
HCT: 44 % (ref 36.0–46.0)
HEMOGLOBIN: 15 g/dL (ref 12.0–15.0)
Potassium: 4 mmol/L (ref 3.5–5.1)
Sodium: 141 mmol/L (ref 135–145)
TCO2: 26 mmol/L (ref 0–100)

## 2015-07-24 LAB — DIFFERENTIAL
Basophils Absolute: 0 10*3/uL (ref 0.0–0.1)
Basophils Relative: 0 %
Eosinophils Absolute: 0.1 10*3/uL (ref 0.0–0.7)
Eosinophils Relative: 0 %
LYMPHS PCT: 38 %
Lymphs Abs: 4.5 10*3/uL — ABNORMAL HIGH (ref 0.7–4.0)
MONO ABS: 0.4 10*3/uL (ref 0.1–1.0)
MONOS PCT: 4 %
NEUTROS ABS: 6.8 10*3/uL (ref 1.7–7.7)
Neutrophils Relative %: 58 %

## 2015-07-24 LAB — TYPE AND SCREEN
ABO/RH(D): O POS
Antibody Screen: NEGATIVE

## 2015-07-24 LAB — PROTIME-INR
INR: 0.99 (ref 0.00–1.49)
Prothrombin Time: 13.3 seconds (ref 11.6–15.2)

## 2015-07-24 LAB — APTT: aPTT: 30 seconds (ref 24–37)

## 2015-07-24 LAB — I-STAT TROPONIN, ED: TROPONIN I, POC: 0 ng/mL (ref 0.00–0.08)

## 2015-07-24 LAB — ABO/RH: ABO/RH(D): O POS

## 2015-07-24 MED ORDER — NICOTINE 21 MG/24HR TD PT24
21.0000 mg | MEDICATED_PATCH | Freq: Every day | TRANSDERMAL | Status: DC
  Administered 2015-07-25 – 2015-07-27 (×3): 21 mg via TRANSDERMAL
  Filled 2015-07-24 (×4): qty 1

## 2015-07-24 MED ORDER — LORAZEPAM 2 MG/ML IJ SOLN: 1.0000 mg | INTRAMUSCULAR | Status: DC | PRN

## 2015-07-24 MED ORDER — BARIUM SULFATE 2.1 % PO SUSP
ORAL | Status: AC
  Filled 2015-07-24: qty 2

## 2015-07-24 MED ORDER — ALUM & MAG HYDROXIDE-SIMETH 200-200-20 MG/5ML PO SUSP: 30.0000 mL | Freq: Four times a day (QID) | ORAL | Status: DC | PRN

## 2015-07-24 MED ORDER — SODIUM CHLORIDE 0.9 % IJ SOLN
3.0000 mL | Freq: Two times a day (BID) | INTRAMUSCULAR | Status: DC
  Administered 2015-07-24 – 2015-07-26 (×4): 3 mL via INTRAVENOUS

## 2015-07-24 MED ORDER — ALPRAZOLAM 0.25 MG PO TABS
0.2500 mg | ORAL_TABLET | Freq: Two times a day (BID) | ORAL | Status: DC
  Administered 2015-07-24 – 2015-07-27 (×6): 0.25 mg via ORAL
  Filled 2015-07-24 (×6): qty 1

## 2015-07-24 MED ORDER — METOPROLOL SUCCINATE 12.5 MG HALF TABLET
12.5000 mg | ORAL_TABLET | Freq: Every evening | ORAL | Status: DC
  Filled 2015-07-24 (×3): qty 1

## 2015-07-24 MED ORDER — SERTRALINE HCL 100 MG PO TABS
100.0000 mg | ORAL_TABLET | Freq: Every evening | ORAL | Status: DC
  Administered 2015-07-25: 100 mg via ORAL
  Filled 2015-07-24 (×3): qty 1

## 2015-07-24 MED ORDER — SODIUM CHLORIDE 0.9 % IV SOLN
1000.0000 mg | Freq: Once | INTRAVENOUS | Status: AC
  Administered 2015-07-24: 1000 mg via INTRAVENOUS
  Filled 2015-07-24: qty 10

## 2015-07-24 MED ORDER — SODIUM CHLORIDE 0.9 % IV SOLN
750.0000 mg | Freq: Two times a day (BID) | INTRAVENOUS | Status: DC
  Administered 2015-07-25: 750 mg via INTRAVENOUS
  Filled 2015-07-24 (×2): qty 7.5

## 2015-07-24 MED ORDER — ACETAMINOPHEN 500 MG PO TABS
1000.0000 mg | ORAL_TABLET | Freq: Three times a day (TID) | ORAL | Status: DC | PRN
  Administered 2015-07-25: 1000 mg via ORAL
  Filled 2015-07-24: qty 2

## 2015-07-24 MED ORDER — GADOBENATE DIMEGLUMINE 529 MG/ML IV SOLN
20.0000 mL | Freq: Once | INTRAVENOUS | Status: AC | PRN
  Administered 2015-07-24: 20 mL via INTRAVENOUS

## 2015-07-24 MED ORDER — ONDANSETRON HCL 4 MG/2ML IJ SOLN
4.0000 mg | Freq: Once | INTRAMUSCULAR | Status: AC
Start: 2015-07-24 — End: 2015-07-24
  Administered 2015-07-24: 4 mg via INTRAVENOUS
  Filled 2015-07-24: qty 2

## 2015-07-24 MED ORDER — LEVOTHYROXINE SODIUM 25 MCG PO TABS
25.0000 ug | ORAL_TABLET | Freq: Every day | ORAL | Status: DC
Start: 2015-07-25 — End: 2015-07-27
  Administered 2015-07-25 – 2015-07-27 (×3): 25 ug via ORAL
  Filled 2015-07-24 (×5): qty 1

## 2015-07-24 MED ORDER — HYDRALAZINE HCL 20 MG/ML IJ SOLN: 5.0000 mg | INTRAMUSCULAR | Status: DC | PRN

## 2015-07-24 MED ORDER — SODIUM CHLORIDE 0.9 % IV SOLN
INTRAVENOUS | Status: DC
  Administered 2015-07-25: via INTRAVENOUS

## 2015-07-24 MED ORDER — DEXAMETHASONE SODIUM PHOSPHATE 4 MG/ML IJ SOLN
10.0000 mg | Freq: Once | INTRAMUSCULAR | Status: AC
  Administered 2015-07-24: 10 mg via INTRAVENOUS
  Filled 2015-07-24 (×2): qty 3

## 2015-07-24 MED ORDER — PANTOPRAZOLE SODIUM 40 MG PO TBEC
40.0000 mg | DELAYED_RELEASE_TABLET | Freq: Every day | ORAL | Status: DC
  Administered 2015-07-25 – 2015-07-27 (×3): 40 mg via ORAL
  Filled 2015-07-24 (×3): qty 1

## 2015-07-24 MED ORDER — ONDANSETRON HCL 4 MG/2ML IJ SOLN: 4.0000 mg | Freq: Three times a day (TID) | INTRAMUSCULAR | Status: DC | PRN

## 2015-07-24 MED ORDER — DEXAMETHASONE SODIUM PHOSPHATE 4 MG/ML IJ SOLN
4.0000 mg | Freq: Four times a day (QID) | INTRAMUSCULAR | Status: DC
  Administered 2015-07-25 (×3): 4 mg via INTRAVENOUS
  Filled 2015-07-24 (×6): qty 1

## 2015-07-24 MED ORDER — LISINOPRIL 5 MG PO TABS
5.0000 mg | ORAL_TABLET | Freq: Every evening | ORAL | Status: DC
  Filled 2015-07-24 (×3): qty 1

## 2015-07-24 NOTE — H&P (Signed)
[facility] History and Physical  Michelle Duran [id number] DOB: 1956/01/21 DOA: 07/24/2015  Referring physician: ED physician PCP: No primary care provider on file.  Specialists:   Chief Complaint: Right facial droop, slurred speech and difficulty swallowing  HPI: Michelle Duran is a 59 y.o. female with PMH of tobacco abuse, right adrenal gland nodule, MVP, fibromyalgia, vitamin D deficiency, hypertension, diet-controlled diabetes, GERD, hypothyroidism, who presents with right facial droop, slurred speech and difficulty swallowing.  Patient reports that she was noticed by her mother to have right facial droop, slurred speech in the past 3 days. She also has difficulty swallowing, and headache over occipital area. Patient states that she lost 20 pounds over one month recently. She does not have unilateral weakness, numbness or tingliness inextremities. She has left hand shaking when she smokes. She does not have chest pain, cough, fever, chills, abdominal pain, diarrhea, symptoms of UTI. She has chronic lower back and right foot pain which have been going on for long time, and was told to have arthritis.  In ED, patient was found to have WBC 11.8, INR 0.99, PTT 30, temperature normal, slightly bradycardia, electrolyte and renal function okay. Patient is admitted to inpatient for further evaluation and treatment. Neurosurgeon, Dr. Cyndy Freeze was consulted to the ED, will see pt in the morning.  MRI of the brain showed right frontal lobe 4 x 4.7 x 3.7 cm mass with significant surrounding vasogenic edema. Anterior to mid right temporal lobe 4.4 x 2.6 x 3.9 cm mass with significant surrounding vasogenic edema. Mass effect upon the right lateral ventricle with midline shift to the left by 7.3 mm. Early trapping of the left temporal horn not excluded. Compression of the right midbrain/ cerebral peduncle with mild right uncal herniation. The vasogenic edema surrounding the enhancing lesions is confluent  through the subinsular/external capsule region. No confluence of the enhancing component. Question primary multi focal glioblastoma versus metastatic disease.  Where does patient live?   At home   Can patient participate in ADLs?  Yes  Review of Systems:   General: no fevers, chills, no changes in body weight, has fatigue HEENT: no blurry vision, hearing changes or sore throat Pulm: no dyspnea, coughing, wheezing CV: no chest pain, palpitations Abd: no nausea, vomiting, abdominal pain, diarrhea, constipation GU: no dysuria, burning on urination, increased urinary frequency, hematuria  Ext: no leg edema Neuro: Has facial droop, difficulty swallowing and slurred speech. No unilateral weakness, numbness, or tingling in extremities, no vision change or hearing loss. Has left hand shaking.  Skin: no rash MSK: No muscle spasm, no deformity, no limitation of range of movement in spin Heme: No easy bruising.  Travel history: No recent long distant travel.  Allergy:  Allergies  Allergen Reactions  . Codeine     REACTION: vomiting  . Prednisone     Panic attacks  . Sulfonamide Derivatives     REACTION: nausea    Past Medical History  Diagnosis Date  . Gallstone   . Hypertension   . Hypothyroidism   . Mitral valve prolapse   . Fibromyalgia   . Anxiety   . Vitamin D deficiency   . Diabetes mellitus without complication ([place])   . Tobacco abuse   . Mass of adrenal gland [facility])     History reviewed. No pertinent past surgical history.  Social History:  reports that she has been smoking Cigarettes.  She has been smoking about 1.00 pack per day. She does not have any smokeless tobacco  history on file. She reports that she does not drink alcohol or use illicit drugs.  Family History:  Family History  Problem Relation Age of Onset  . Hypertension Father   . Glaucoma       Prior to Admission medications   Medication Sig Start Date End Date Taking? Authorizing Provider   acetaminophen (TYLENOL) 650 MG CR tablet Take 1,300 mg by mouth every 8 (eight) hours as needed for pain.   Yes Historical Provider, MD  ALPRAZolam Duanne Moron) 0.5 MG tablet Take 0.25 mg by mouth 2 (two) times daily.   Yes Historical Provider, MD  levothyroxine (SYNTHROID, LEVOTHROID) 50 MCG tablet Take 25 mcg by mouth daily.   Yes Historical Provider, MD  lisinopril (PRINIVIL,ZESTRIL) 5 MG tablet Take 5 mg by mouth every evening.   Yes Historical Provider, MD  metoprolol succinate (TOPROL-XL) 25 MG 24 hr tablet Take 12.5 mg by mouth every evening.   Yes Historical Provider, MD  omeprazole (PRILOSEC) 20 MG capsule Take 40 mg by mouth daily.    Yes Historical Provider, MD  sertraline (ZOLOFT) 100 MG tablet Take 100 mg by mouth every evening.   Yes Historical Provider, MD    Physical Exam: Filed Vitals:   07/24/15 1945 07/24/15 1945 07/24/15 2000 07/24/15 2015  BP: 117/55  124/71 104/72  Pulse: 59  63 65  Temp:  98.6 F (37 C)    TempSrc:      Resp: 14  17 19   Height:      Weight:      SpO2: 95%  96% 97%   General: Not in acute distress HEENT:       Eyes: PERRL, EOMI, no scleral icterus.       ENT: No discharge from the ears and nose, no pharynx injection, no tonsillar enlargement.        Neck: No JVD, no bruit, no mass felt. Heme: No neck lymph node enlargement. Cardiac: S1/S2, RRR, No murmurs, No gallops or rubs. Pulm:  No rales, wheezing, rhonchi or rubs. Abd: Soft, nondistended, nontender, no rebound pain, no organomegaly, BS present. Ext: No pitting leg edema bilaterally. 2+DP/PT pulse bilaterally. Musculoskeletal: No joint deformities, No joint redness or warmth, no limitation of ROM in spin. Skin: No rashes.  Neuro: Alert, oriented X3, cranial nerves II-XII grossly intact except for R facial droop, muscle strength 5/5 in all extremities, sensation to light touch intact. Brachial reflex 1+ bilaterally. Knee reflex 1+ bilaterally. Negative Babinski's sign. Normal finger to nose  test. Psych: Patient is not psychotic, no suicidal or hemocidal ideation.  Labs on Admission:  Basic Metabolic Panel:  Recent Labs Lab 07/24/15 1530 07/24/15 1659  NA 139 141  K 4.0 4.0  CL 106 104  CO2 25  --   GLUCOSE 142* 130*  BUN 12 13  CREATININE 0.94 0.90  CALCIUM 9.1  --    Liver Function Tests:  Recent Labs Lab 07/24/15 1530  AST 21  ALT 22  ALKPHOS 112  BILITOT 0.2*  PROT 6.5  ALBUMIN 3.7   No results for input(s): LIPASE, AMYLASE in the last 168 hours. No results for input(s): AMMONIA in the last 168 hours. CBC:  Recent Labs Lab 07/24/15 1530 07/24/15 1659  WBC 11.8*  --   NEUTROABS 6.8  --   HGB 14.3 15.0  HCT 42.2 44.0  MCV 92.1  --   PLT 372  --    Cardiac Enzymes: No results for input(s): CKTOTAL, CKMB, CKMBINDEX, TROPONINI in the last 168  hours.  BNP (last 3 results) No results for input(s): BNP in the last 8760 hours.  ProBNP (last 3 results) No results for input(s): PROBNP in the last 8760 hours.  CBG: No results for input(s): GLUCAP in the last 168 hours.  Radiological Exams on Admission: Mr Kizzie Fantasia Contrast  07/24/2015  CLINICAL DATA:  59 year old diabetic female presenting with facial droop and slurred speech with onset 3 days ago. Initial encounter. EXAM: MRI HEAD WITHOUT AND WITH CONTRAST TECHNIQUE: Multiplanar, multiecho pulse sequences of the brain and surrounding structures were obtained without and with intravenous contrast. CONTRAST:  21mL MULTIHANCE GADOBENATE DIMEGLUMINE 529 MG/ML IV SOLN COMPARISON:  06/23/2006 head CT.  No comparison brain MR. FINDINGS: Right frontal lobe 4 x 4.7 x 3.7 cm irregular necrotic possibly partially hemorrhagic mass with significant surrounding vasogenic edema. Anterior to mid right temporal lobe 4.4 x 2.6 x 3.9 cm irregular enhancing possibly necrotic/ hemorrhagic mass with significant surrounding vasogenic edema. Mass effect upon the right lateral ventricle with midline shift to the left by  7.3 mm. Early trapping of the left temporal horn not excluded. Compression of the right midbrain/ cerebral peduncle is with mild right uncal herniation. The vasogenic edema surrounding the enhancing lesions is confluent through the subinsular/external capsule region. No confluence of the enhancing component. It is possible this represents presence of metastatic disease although primary multi focal glioma blastoma is also consideration. No MR evidence to suggest infection. No acute thrombotic infarct. Partial opacification left mastoid air cells without obstructing lesion noted. Minimal mucosal thickening ethmoid sinus air cells. Major intracranial vascular structures are patent. Decreased signal intensity of bone marrow may be related to patient's habitus. Correlation with CBC to exclude anemia contributing to this appearance may be considered IMPRESSION: Right frontal lobe 4 x 4.7 x 3.7 cm mass with significant surrounding vasogenic edema. Anterior to mid right temporal lobe 4.4 x 2.6 x 3.9 cm mass with significant surrounding vasogenic edema. Mass effect upon the right lateral ventricle with midline shift to the left by 7.3 mm. Early trapping of the left temporal horn not excluded. Compression of the right midbrain/ cerebral peduncle with mild right uncal herniation. The vasogenic edema surrounding the enhancing lesions is confluent through the subinsular/external capsule region. No confluence of the enhancing component. Question primary multi focal glioblastoma versus metastatic disease. These results were called by telephone at the time of interpretation on 07/24/2015 at 7:40 pm to Dr. Nathaniel Man , who verbally acknowledged these results. Electronically Signed   By: Genia Del M.D.   On: 07/24/2015 19:55    EKG: Independently reviewed.  QTC 416, mild T-wave inversion in lead 3 and aVF   Assessment/Plan Principal Problem:   Brain mass Active Problems:   Hypothyroidism   PANIC DISORDER   Essential  hypertension   Mitral valve disorder   GERD   Facial droop   Slurred speech   Brain mass: Not clear whether patient has primary brain tumor or metastasized tumor. Patient is symptomatic. MRI of her brain showed mass effect upon the right lateral ventricle with midline shift to the left by 7.3 mm. Neurosurgery, Dr. Cyndy Freeze was consulted by ED, recommended to get further image tests to explore primary tumors.  -will admit to SDU -started Keppra and Decadron per neurosurgeon -follow up neurosurgeon's recommendations -Frequent neurochecks -INR/PTT/type & screen -Ct-abd/pelvis and CT-chest without CM -NPO until pass SLP   Hypothyroidism: Last TSH was not on record -Continue home Synthroid -Check TSH  Essential hypertension: -Metoprolol, lisinopril -Hydralazine  when necessary  GERD: -Protonix  Depression and Anxiety: Stable, no suicidal or homicidal ideations. -Continue home medications: Xanax, Zoloft  Tobacco abuse: -Did counseling about importance of quitting smoking -Nicotine patch   DVT ppx: SCD Code Status: Full code Family Communication:   Yes, patient's mother at bed side Disposition Plan: Admit to inpatient   Date of Service 07/24/2015    Ivor Costa [facility] Pager [phone]  If 7PM-7AM, please contact night-coverage [email or web address] Password [facility] 07/24/2015, 9:52 PM

## 2015-07-24 NOTE — ED Provider Notes (Signed)
Patient noted to have left side of her mouth drooping 3 days ago. Noted by her mother. Patient also notes that she's been choking on liquids for the past 3 days. Her mother states that her speech was slurred over the past 3 days but has improved markedly today. Patient denies any difficulty with gait or coordination. No other associated symptoms. No treatment prior to coming here on exam patient is alert last oh, score 15 HE ENT exam mild left mouth droop. Otherwise without facial asymmetry neck supple no bruit heart regular rate and rhythm neurologic Glasgow Coma Score 15. Left-sided central cranial nerve VII deficit other cranial nerves II through XII intact. Motor strength 5 over 5 overall. Finger to nose normal pronator drift normal  Orlie Dakin, MD 07/25/15 [phone]

## 2015-07-24 NOTE — ED Provider Notes (Signed)
CSN: [id number]     Arrival date & time 07/24/15  1506 History   First MD Initiated Contact with Patient 07/24/15 1552     Chief Complaint  Patient presents with  . Facial Droop     (Consider location/radiation/quality/duration/timing/severity/associated sxs/prior Treatment) HPI  Patient is a 59 year old female with a past medical history significant for anxiety and fibromyalgia, who presents to the emergency department with left facial droop and slurring of speech for the past 3 days. Patient reports that her mother noticed a slight left-sided facial droop 3 days ago. Patient states her face does look slightly different than normal. States her voice sounds different to her. Reports a 2 day history of difficulty swallowing with some choking episodes. Reports a occipital headache that is constant, dull, nonradiating. Nothing improves or worsens the pain. Has tried Tylenol with minimal relief. Reports a history of migraines, states this does not feel like her normal migraines. Denies recent falls or head trauma. Denies dizziness, change in vision, extremity numbness or weakness, chest pain, shortness of breath, fevers, neck pain or stiffness. Denies prior episodes of this happening in the past. Denies rash.  Past Medical History  Diagnosis Date  . Gallstone   . Hypertension   . Hypothyroidism   . Mitral valve prolapse   . Fibromyalgia   . Anxiety   . Vitamin D deficiency   . Diabetes mellitus without complication ([place])   . Tobacco abuse   . Mass of adrenal gland [facility])    History reviewed. No pertinent past surgical history. Family History  Problem Relation Age of Onset  . Hypertension Father   . Glaucoma     Social History  Substance Use Topics  . Smoking status: Current Every Day Smoker -- 1.00 packs/day    Types: Cigarettes  . Smokeless tobacco: None  . Alcohol Use: No   OB History    No data available     Review of Systems  Constitutional: Positive for fatigue. Negative  for fever, diaphoresis, activity change, appetite change and unexpected weight change.  HENT: Positive for trouble swallowing. Negative for congestion, ear pain and facial swelling.   Eyes: Negative for visual disturbance.  Respiratory: Negative for cough, chest tightness and shortness of breath.   Cardiovascular: Negative for chest pain.  Gastrointestinal: Positive for nausea. Negative for vomiting, abdominal pain and diarrhea.  Genitourinary: Negative for dysuria, hematuria, flank pain and difficulty urinating.  Musculoskeletal: Negative for back pain and neck pain.  Skin: Negative for rash.  Neurological: Positive for facial asymmetry, speech difficulty and headaches. Negative for dizziness, seizures, syncope, weakness, light-headedness and numbness.  Psychiatric/Behavioral: Negative for behavioral problems.      Allergies  Codeine; Prednisone; and Sulfonamide derivatives  Home Medications   Prior to Admission medications   Medication Sig Start Date End Date Taking? Authorizing Provider  acetaminophen (TYLENOL) 650 MG CR tablet Take 1,300 mg by mouth every 8 (eight) hours as needed for pain.   Yes Historical Provider, MD  ALPRAZolam Duanne Moron) 0.5 MG tablet Take 0.25 mg by mouth 2 (two) times daily.   Yes Historical Provider, MD  levothyroxine (SYNTHROID, LEVOTHROID) 50 MCG tablet Take 25 mcg by mouth daily.   Yes Historical Provider, MD  lisinopril (PRINIVIL,ZESTRIL) 5 MG tablet Take 5 mg by mouth every evening.   Yes Historical Provider, MD  metoprolol succinate (TOPROL-XL) 25 MG 24 hr tablet Take 12.5 mg by mouth every evening.   Yes Historical Provider, MD  omeprazole (PRILOSEC) 20 MG capsule Take 40  mg by mouth daily.    Yes Historical Provider, MD  sertraline (ZOLOFT) 100 MG tablet Take 100 mg by mouth every evening.   Yes Historical Provider, MD   BP 140/57 mmHg  Pulse 53  Temp(Src) 97.4 F (36.3 C) (Oral)  Resp 15  Ht 5\' 2"  (1.575 m)  Wt 85.2 kg  BMI 34.35 kg/m2  SpO2  95% Physical Exam  Constitutional: She is oriented to person, place, and time. She appears well-developed and well-nourished. No distress.  HENT:  Head: Atraumatic.  Right Ear: External ear normal.  Left Ear: External ear normal.  Nose: Nose normal.  Mouth/Throat: Oropharynx is clear and moist.  Eyes: Conjunctivae and EOM are normal. Pupils are equal, round, and reactive to light.  Neck: Normal range of motion. Neck supple. No JVD present. No tracheal deviation present.  Cardiovascular: Normal rate, regular rhythm, normal heart sounds and intact distal pulses.   Pulmonary/Chest: Effort normal and breath sounds normal. No respiratory distress. She has no wheezes.  Abdominal: Soft. Bowel sounds are normal. She exhibits no distension. There is no tenderness. There is no rebound and no guarding.  Musculoskeletal: Normal range of motion.  Neurological: She is alert and oriented to person, place, and time. She has normal strength and normal reflexes. She is not disoriented. She displays no tremor. A cranial nerve deficit is present. No sensory deficit. She exhibits normal muscle tone. She displays no seizure activity. Coordination and gait normal. GCS eye subscore is 4. GCS verbal subscore is 5. GCS motor subscore is 6. She displays no Babinski's sign on the right side. She displays no Babinski's sign on the left side.  L facial droop with forehead sparing.   Skin: Skin is warm. No pallor.  Psychiatric: She has a normal mood and affect.  Nursing note and vitals reviewed.   ED Course  Procedures (including critical care time) Labs Review Labs Reviewed  CBC - Abnormal; Notable for the following:    WBC 11.8 (*)    All other components within normal limits  DIFFERENTIAL - Abnormal; Notable for the following:    Lymphs Abs 4.5 (*)    All other components within normal limits  COMPREHENSIVE METABOLIC PANEL - Abnormal; Notable for the following:    Glucose, Bld 142 (*)    Total Bilirubin 0.2  (*)    All other components within normal limits  I-STAT CHEM 8, ED - Abnormal; Notable for the following:    Glucose, Bld 130 (*)    All other components within normal limits  MRSA PCR SCREENING  PROTIME-INR  APTT  URINE RAPID DRUG SCREEN, HOSP PERFORMED  HIV ANTIBODY (ROUTINE TESTING)  BASIC METABOLIC PANEL  CBC  I-STAT TROPOININ, ED  TYPE AND SCREEN  ABO/RH    Imaging Review Mr Jeri Cos Wo Contrast  07/24/2015  CLINICAL DATA:  59 year old diabetic female presenting with facial droop and slurred speech with onset 3 days ago. Initial encounter. EXAM: MRI HEAD WITHOUT AND WITH CONTRAST TECHNIQUE: Multiplanar, multiecho pulse sequences of the brain and surrounding structures were obtained without and with intravenous contrast. CONTRAST:  37mL MULTIHANCE GADOBENATE DIMEGLUMINE 529 MG/ML IV SOLN COMPARISON:  06/23/2006 head CT.  No comparison brain MR. FINDINGS: Right frontal lobe 4 x 4.7 x 3.7 cm irregular necrotic possibly partially hemorrhagic mass with significant surrounding vasogenic edema. Anterior to mid right temporal lobe 4.4 x 2.6 x 3.9 cm irregular enhancing possibly necrotic/ hemorrhagic mass with significant surrounding vasogenic edema. Mass effect upon the right lateral ventricle  with midline shift to the left by 7.3 mm. Early trapping of the left temporal horn not excluded. Compression of the right midbrain/ cerebral peduncle is with mild right uncal herniation. The vasogenic edema surrounding the enhancing lesions is confluent through the subinsular/external capsule region. No confluence of the enhancing component. It is possible this represents presence of metastatic disease although primary multi focal glioma blastoma is also consideration. No MR evidence to suggest infection. No acute thrombotic infarct. Partial opacification left mastoid air cells without obstructing lesion noted. Minimal mucosal thickening ethmoid sinus air cells. Major intracranial vascular structures are  patent. Decreased signal intensity of bone marrow may be related to patient's habitus. Correlation with CBC to exclude anemia contributing to this appearance may be considered IMPRESSION: Right frontal lobe 4 x 4.7 x 3.7 cm mass with significant surrounding vasogenic edema. Anterior to mid right temporal lobe 4.4 x 2.6 x 3.9 cm mass with significant surrounding vasogenic edema. Mass effect upon the right lateral ventricle with midline shift to the left by 7.3 mm. Early trapping of the left temporal horn not excluded. Compression of the right midbrain/ cerebral peduncle with mild right uncal herniation. The vasogenic edema surrounding the enhancing lesions is confluent through the subinsular/external capsule region. No confluence of the enhancing component. Question primary multi focal glioblastoma versus metastatic disease. These results were called by telephone at the time of interpretation on 07/24/2015 at 7:40 pm to Dr. Nathaniel Man , who verbally acknowledged these results. Electronically Signed   By: Genia Del M.D.   On: 07/24/2015 19:55   I have personally reviewed and evaluated these images and lab results as part of my medical decision-making.   EKG Interpretation   Date/Time:  Monday July 24 2015 15:32:43 EST Ventricular Rate:  59 PR Interval:  139 QRS Duration: 90 QT Interval:  420 QTC Calculation: 416 R Axis:   21 Text Interpretation:  Sinus rhythm Low voltage, precordial leads No  significant change since last tracing Confirmed by Winfred Leeds  MD, SAM  [phone]) on 07/24/2015 4:13:45 PM      MDM   Final diagnoses:  Facial droop  Brain mass  Brain mass   Patient is a 59 year old female with a past medical history of tobacco abuse, fibromyalgia, HTN, hypothyroidism, who presents to the emergency department with a three-day history of left facial droop, slurred speech, difficulty swallowing. On arrival no acute distress, not ill appearing. Afebrile, hemodynamically stable.  Exam notable for a left facial droop with forehead sparing, some slurred speech, strength 5/5 throughout, no sensory deficit, normal coordination, no pronator drift.  No anticoagulation.  Patient's clinical picture concerning for intracranial insult (CVA, hemorrhage, malignancy), complex migraine, electrolyte abnormality. Given the patient's left CN VII deficit with forehead sparing doubt peripheral CNII palsy.   WBC 11.8. Hgb 14.3. No significant electrolyte abnormalities. Glucose 142.  MRI brain showed 2 right masses in the frontal and temporal lobes with significant surrounding vasogenic edema. Mass effect of the right lateral ventricle with midline shift to the left by 7.3 mm.  Discussed the patient's case with neurosurgery on the telephone, recommended Decadron and loaded with Keppra. We'll admit to medicine for metastatic malignancy workup. Patient will be seen by neurosurgery in the morning.  Given Decadron 10 mg, Keppra 1000 mg. Patient became nauseous, given antiemetics.  Discussed the findings with the patient. Stable for the floor. Transferred to the floor in stable condition.  Nathaniel Man, MD 07/25/15 0147  Orlie Dakin, MD 07/26/15 [phone]

## 2015-07-24 NOTE — ED Notes (Signed)
Pt states that 3 days ago her mother noticed that she had a facial droop and her speech is slurred. States that this weekend she also had a difficult time swallowing and chocked several times. Has also had a headache in the back of her head and has been drooling. Denies difficulty ambulating.

## 2015-07-24 NOTE — ED Notes (Signed)
Patient transported to ct, then MRI.

## 2015-07-24 NOTE — ED Notes (Addendum)
Called and spoke to [place], receiving RN that the patient will need CT scans completed. Cannot rush drinking time as this is a complete work up per CT. Explained to charge morgan, patient is allowed to transport at this time without scans complete.

## 2015-07-24 NOTE — ED Notes (Signed)
Unhooked patient from cords and ambulated her to restroom.

## 2015-07-24 NOTE — ED Notes (Signed)
Pt still at MRI. Introduced myself to pt family.

## 2015-07-25 ENCOUNTER — Inpatient Hospital Stay (HOSPITAL_COMMUNITY): Payer: BC Managed Care – PPO

## 2015-07-25 ENCOUNTER — Other Ambulatory Visit: Payer: Self-pay | Admitting: Neurological Surgery

## 2015-07-25 ENCOUNTER — Encounter (HOSPITAL_COMMUNITY): Payer: Self-pay | Admitting: Radiology

## 2015-07-25 DIAGNOSIS — G936 Cerebral edema: Secondary | ICD-10-CM

## 2015-07-25 DIAGNOSIS — R29810 Facial weakness: Secondary | ICD-10-CM

## 2015-07-25 LAB — HIV ANTIBODY (ROUTINE TESTING W REFLEX): HIV Screen 4th Generation wRfx: NONREACTIVE

## 2015-07-25 LAB — BASIC METABOLIC PANEL
Anion gap: 7 (ref 5–15)
BUN: 9 mg/dL (ref 6–20)
CALCIUM: 9.2 mg/dL (ref 8.9–10.3)
CHLORIDE: 105 mmol/L (ref 101–111)
CO2: 25 mmol/L (ref 22–32)
CREATININE: 0.83 mg/dL (ref 0.44–1.00)
GFR calc non Af Amer: >60 mL/min (ref >60)
GLUCOSE: 230 mg/dL — AB (ref 65–99)
Potassium: 3.9 mmol/L (ref 3.5–5.1)
Sodium: 137 mmol/L (ref 135–145)

## 2015-07-25 LAB — RAPID URINE DRUG SCREEN, HOSP PERFORMED
AMPHETAMINES: NOT DETECTED
BARBITURATES: NOT DETECTED
BENZODIAZEPINES: NOT DETECTED
COCAINE: NOT DETECTED
Opiates: NOT DETECTED
TETRAHYDROCANNABINOL: NOT DETECTED

## 2015-07-25 LAB — CBC
HCT: 42.9 % (ref 36.0–46.0)
HEMOGLOBIN: 14.6 g/dL (ref 12.0–15.0)
MCH: 31.1 pg (ref 26.0–34.0)
MCHC: 34 g/dL (ref 30.0–36.0)
MCV: 91.5 fL (ref 78.0–100.0)
PLATELETS: 342 10*3/uL (ref 150–400)
RBC: 4.69 MIL/uL (ref 3.87–5.11)
RDW: 12.9 % (ref 11.5–15.5)
WBC: 9.9 10*3/uL (ref 4.0–10.5)

## 2015-07-25 LAB — MRSA PCR SCREENING: MRSA BY PCR: NEGATIVE

## 2015-07-25 LAB — TSH: TSH: 0.36 u[IU]/mL (ref 0.350–4.500)

## 2015-07-25 MED ORDER — IOHEXOL 300 MG/ML  SOLN
100.0000 mL | Freq: Once | INTRAMUSCULAR | Status: AC | PRN
  Administered 2015-07-25: 100 mL via INTRAVENOUS

## 2015-07-25 MED ORDER — BOOST PLUS PO LIQD: 237.0000 mL | Freq: Two times a day (BID) | ORAL | Status: DC | PRN

## 2015-07-25 MED ORDER — LEVETIRACETAM 750 MG PO TABS: 750.0000 mg | ORAL_TABLET | Freq: Two times a day (BID) | ORAL | Status: DC

## 2015-07-25 MED ORDER — LEVETIRACETAM 750 MG PO TABS
750.0000 mg | ORAL_TABLET | Freq: Two times a day (BID) | ORAL | Status: DC
  Administered 2015-07-25 – 2015-07-27 (×4): 750 mg via ORAL
  Filled 2015-07-25 (×6): qty 1

## 2015-07-25 MED ORDER — GADOBENATE DIMEGLUMINE 529 MG/ML IV SOLN
18.0000 mL | Freq: Once | INTRAVENOUS | Status: AC | PRN
  Administered 2015-07-25: 18 mL via INTRAVENOUS

## 2015-07-25 MED ORDER — PANTOPRAZOLE SODIUM 40 MG PO TBEC: 40.0000 mg | DELAYED_RELEASE_TABLET | Freq: Every day | ORAL | Status: DC

## 2015-07-25 MED ORDER — DEXAMETHASONE 4 MG PO TABS
4.0000 mg | ORAL_TABLET | Freq: Four times a day (QID) | ORAL | Status: DC
  Administered 2015-07-25 – 2015-07-27 (×8): 4 mg via ORAL
  Filled 2015-07-25 (×13): qty 1

## 2015-07-25 MED ORDER — BOOST PLUS PO LIQD: 237.0000 mL | ORAL | Status: DC

## 2015-07-25 MED ORDER — NICOTINE 21 MG/24HR TD PT24: 21.0000 mg | MEDICATED_PATCH | Freq: Every day | TRANSDERMAL | Status: DC

## 2015-07-25 MED ORDER — DEXAMETHASONE 4 MG PO TABS: 4.0000 mg | ORAL_TABLET | Freq: Four times a day (QID) | ORAL | Status: DC

## 2015-07-25 MED ORDER — BOOST PLUS PO LIQD
237.0000 mL | ORAL | Status: DC
  Filled 2015-07-25 (×3): qty 237

## 2015-07-25 MED ORDER — LORAZEPAM 2 MG/ML IJ SOLN
2.0000 mg | Freq: Once | INTRAMUSCULAR | Status: DC
Start: 2015-07-25 — End: 2015-07-27
  Filled 2015-07-25: qty 1

## 2015-07-25 NOTE — Discharge Instructions (Addendum)
Cerebral Edema Cerebral edema is another term for swelling of the brain. Cells in the brain that have been injured or infected can swell, and blood vessels may leak fluid into the brain tissue. The extra fluid in the brain affects pressure in the skull and the flow of blood in the brain.  Cerebral edema can be mild to severe, depending on the cause and whether the swelling is in many areas of the brain. It is important to identify the cause and stop the swelling from getting worse as severe cases can cause permanent damage. Treatments are available to help bring down the swelling and restore brain function.  CAUSES   Injury to your head, such as in a car accident or contact sport.  Infection from a virus or bacteria.  Stroke.  High altitude sickness.  A buildup of acid in your blood.  Very high sugar levels caused by diabetes (diabetic ketoacidosis).  Very high blood pressure (malignant hypertension).  Liver disease.  Poisoning, such as carbon monoxide or lead.  Conditions in which your body retains too much water.  Illegal drug use.  Alcohol abuse.  Reptile or marine animal bites.  Brain tumor. SIGNS AND SYMPTOMS Symptoms may include:  Headache.  Nausea or vomiting.  Dizziness.  Neck pain.  Trouble breathing.  Blurred vision or other vision changes.  Balance and motor problems, such as trouble walking.  Trouble speaking.  Feeling confused.  Passing out.  Numbness or weakness in any body part. DIAGNOSIS  Diagnosis may include:  Medical history and physical exam.  Imaging tests, including CT scans or MRI.  Blood tests. TREATMENT  For mild edema, several days of rest and limiting activities may be needed. More severe cases require treatment in a hospital. Treatment may include:  Proper head and neck positioning to increase blood flow.  Fluids given through your vein (intravenously or IV).  Regulating your blood pressure and body  temperature.  Medicines to reduce inflammation and increase urination.  Oxygen.  Using a ventilator, which is a device to help you breathe.  Shunting, which is a tube placed in the brain to help drain fluid.  Surgery. If the edema is caused by a condition, treatment will also focus on managing the condition.  HOME CARE INSTRUCTIONS   Rest and limit your activities as directed by your health care provider.  Take medicines only as directed by your health care provider.  Manage any other health care conditions you may have, if applicable.  Keep all follow-up visits as directed by your health care provider. This is important. SEEK MEDICAL CARE IF:  Your symptoms return, do not get better, or get worse, including:  Headache.  Nausea or vomiting.  Dizziness.  Neck pain.  Blurred vision or other vision changes. SEEK IMMEDIATE MEDICAL CARE IF:  You pass out.   You have numbness or weakness in any body part.  You feel confused.  You have trouble breathing.  You have balance and motor problems, such as trouble walking.  You have trouble speaking. MAKE SURE YOU:  Understand these instructions.   Will watch your condition.  Will get help right away if you are not doing well or get worse.    This information is not intended to replace advice given to you by your health care provider. Make sure you discuss any questions you have with your health care provider.   Document Released: 03/16/2014 Document Reviewed: 03/16/2014 Elsevier Interactive Patient Education 2016 [employer].   Metastatic Brain Tumor  A metastatic brain tumor is a growth in your brain. It is made of cancer cells from another part of your body that traveled to your brain through your bloodstream, along the nerves of your brain (cranial nerves), or through the opening at the base of your skull.  CAUSES  The most common cause of a metastatic brain tumor in men is lung cancer. In women, it is breast  cancer. Other common causes include:  Unknown types of cancers.  Skin cancer (melanoma).  Colon cancer.  Kidney cancer. SIGNS AND SYMPTOMS  Most signs and symptoms of metastatic brain tumors are caused by increased pressure inside your brain. A headache is often the first symptom. Eventually, almost everyone with a metastatic brain tumor has a headache. Other signs and symptoms include:  Vomiting.  Weakness or fatigue.  Seizures.  Sensory problems, like tingling or numbness.  Problems walking.  Clumsiness.  Emotional changes.  Personality changes.  Changes in speech or vision. DIAGNOSIS  Your health care provider can diagnose a metastatic brain tumor from your medical history and physical exam. You may also have some additional tests, including:  A nervous system function test (neurologic exam).  Imaging studies of your brain, such as an MRI and CT scan.  Having a small piece of tissue removed (biopsy) from your brain to be checked under a microscope. TREATMENT  Treatment depends on the type of cancer you have and your overall health. It also depends on the size of your tumor and the number of brain tumors you have. The most common treatments include:  Medicines to control pain, nausea, and seizures.  Cancer-killing drugs (chemotherapy).  An X-ray treatment called radiation therapy to kill brain tumor cells.  A type of radiation therapy that is targeted to exact locations in the brain (stereotactic radiotherapy).  Surgery to remove brain tumors. HOME CARE INSTRUCTIONS  Only take medicines as directed by your health care provider.  Do not drive if:  You are taking strong pain medicines.  Your vision or thinking is not clear.  Take two 10-minute walks every day if you are able. Exercising is a good way to relieve stress. It can also help you sleep.  Be sure to get enough calories and protein in your diet.  If you have a poor appetite or feel nauseous, eat  small meals often.  Supplement your diet with protein shakes or milk shakes.  It is common to have anxiety and depression when you are coping with cancer.  Talk to friends and loved ones about your feelings.  Have a good support system at home. SEEK MEDICAL CARE IF:  You have chills or fever.  Your medicine is not controlling your symptoms.  Your symptoms get worse or you develop new symptoms.  You are having trouble caring for yourself or being cared for at home.  You are struggling with anxiety or depression. SEEK IMMEDIATE MEDICAL CARE IF:  You cannot stop vomiting.  You cannot keep down any foods or fluids.  You have sudden changes in speech or vision.  You have a seizure.  You can no longer take care of yourself at home.   This information is not intended to replace advice given to you by your health care provider. Make sure you discuss any questions you have with your health care provider.   Document Released: 05/15/2004 Document Revised: 09/09/2014 Document Reviewed: 08/10/2013 Elsevier Interactive Patient Education 2016 [employer].   Diabetes Mellitus and Food It is important for you to  manage your blood sugar (glucose) level. Your blood glucose level can be greatly affected by what you eat. Eating healthier foods in the appropriate amounts throughout the day at about the same time each day will help you control your blood glucose level. It can also help slow or prevent worsening of your diabetes mellitus. Healthy eating may even help you improve the level of your blood pressure and reach or maintain a healthy weight.  General recommendations for healthful eating and cooking habits include:  Eating meals and snacks regularly. Avoid going long periods of time without eating to lose weight.  Eating a diet that consists mainly of plant-based foods, such as fruits, vegetables, nuts, legumes, and whole grains.  Using low-heat cooking methods, such as baking,  instead of high-heat cooking methods, such as deep frying. Work with your dietitian to make sure you understand how to use the Nutrition Facts information on food labels. HOW CAN FOOD AFFECT ME? Carbohydrates Carbohydrates affect your blood glucose level more than any other type of food. Your dietitian will help you determine how many carbohydrates to eat at each meal and teach you how to count carbohydrates. Counting carbohydrates is important to keep your blood glucose at a healthy level, especially if you are using insulin or taking certain medicines for diabetes mellitus. Alcohol Alcohol can cause sudden decreases in blood glucose (hypoglycemia), especially if you use insulin or take certain medicines for diabetes mellitus. Hypoglycemia can be a life-threatening condition. Symptoms of hypoglycemia (sleepiness, dizziness, and disorientation) are similar to symptoms of having too much alcohol.  If your health care provider has given you approval to drink alcohol, do so in moderation and use the following guidelines:  Women should not have more than one drink per day, and men should not have more than two drinks per day. One drink is equal to:  12 oz of beer.  5 oz of wine.  1 oz of hard liquor.  Do not drink on an empty stomach.  Keep yourself hydrated. Have water, diet soda, or unsweetened iced tea.  Regular soda, juice, and other mixers might contain a lot of carbohydrates and should be counted. WHAT FOODS ARE NOT RECOMMENDED? As you make food choices, it is important to remember that all foods are not the same. Some foods have fewer nutrients per serving than other foods, even though they might have the same number of calories or carbohydrates. It is difficult to get your body what it needs when you eat foods with fewer nutrients. Examples of foods that you should avoid that are high in calories and carbohydrates but low in nutrients include:  Trans fats (most processed foods list  trans fats on the Nutrition Facts label).  Regular soda.  Juice.  Candy.  Sweets, such as cake, pie, doughnuts, and cookies.  Fried foods. WHAT FOODS CAN I EAT? Eat nutrient-rich foods, which will nourish your body and keep you healthy. The food you should eat also will depend on several factors, including:  The calories you need.  The medicines you take.  Your weight.  Your blood glucose level.  Your blood pressure level.  Your cholesterol level. You should eat a variety of foods, including:  Protein.  Lean cuts of meat.  Proteins low in saturated fats, such as fish, egg whites, and beans. Avoid processed meats.  Fruits and vegetables.  Fruits and vegetables that may help control blood glucose levels, such as apples, mangoes, and yams.  Dairy products.  Choose fat-free or low-fat  dairy products, such as milk, yogurt, and cheese.  Grains, bread, pasta, and rice.  Choose whole grain products, such as multigrain bread, whole oats, and brown rice. These foods may help control blood pressure.  Fats.  Foods containing healthful fats, such as nuts, avocado, olive oil, canola oil, and fish. DOES EVERYONE WITH DIABETES MELLITUS HAVE THE SAME MEAL PLAN? Because every person with diabetes mellitus is different, there is not one meal plan that works for everyone. It is very important that you meet with a dietitian who will help you create a meal plan that is just right for you.   This information is not intended to replace advice given to you by your health care provider. Make sure you discuss any questions you have with your health care provider.   Document Released: 05/16/2005 Document Revised: 09/09/2014 Document Reviewed: 07/16/2013 Elsevier Interactive Patient Education 2016 [employer]  Insulin Aspart injection What is this medicine? INSULIN ASPART (IN su lin AS part) is a human-made form of insulin. This drug lowers the amount of sugar in your blood. It is a  fast acting insulin that starts working faster than regular insulin. It will not work as long as regular insulin. This medicine may be used for other purposes; ask your health care provider or pharmacist if you have questions. What should I tell my health care provider before I take this medicine? They need to know if you have any of these conditions: -episodes of hypoglycemia -kidney disease -liver disease -an unusual or allergic reaction to insulin, metacresol, other medicines, foods, dyes, or preservatives -pregnant or trying to get pregnant -breast-feeding How should I use this medicine? This medicine is for injection under the skin. Use exactly as directed. It is important to follow the directions given to you by your health care professional or doctor. You should start your meal within 5 to 10 minutes after injection. Have food ready before injection. Do not delay eating. You will be taught how to use this medicine and how to adjust doses for activities and illness. Do not use more insulin than prescribed. Do not use more or less often than prescribed. Always check the appearance of your insulin before using it. This medicine should be clear and colorless like water. Do not use if it is cloudy, thickened, colored, or has solid particles in it. It is important that you put your used needles and syringes in a special sharps container. Do not put them in a trash can. If you do not have a sharps container, call your pharmacist or healthcare provider to get one. Talk to your pediatrician regarding the use of this medicine in children. While this drug may be prescribed for children as young as 66 years of age for selected conditions, precautions do apply. Overdosage: If you think you have taken too much of this medicine contact a poison control center or emergency room at once. NOTE: This medicine is only for you. Do not share this medicine with others. What if I miss a dose? It is important not to  miss a dose. Your health care professional or doctor should discuss a plan for missed doses with you. If you do miss a dose, follow their plan. Do not take double doses. What may interact with this medicine? -other medicines for diabetes Many medications may cause an increase or decrease in blood sugar, these include: -alcohol containing beverages -aspirin and aspirin-like drugs -chloramphenicol -chromium -diuretics -female hormones, like estrogens or progestins and birth control pills -heart  medicines -isoniazid -female hormones or anabolic steroids -medicines for weight loss -medicines for allergies, asthma, cold, or cough -medicines for mental problems -medicines called MAO Inhibitors like Nardil, Parnate, Marplan, Eldepryl -niacin -NSAIDs, medicines for pain and inflammation, like ibuprofen or naproxen -pentamidine -phenytoin -probenecid -quinolone antibiotics like ciprofloxacin, levofloxacin, ofloxacin -some herbal dietary supplements -steroid medicines like prednisone or cortisone -thyroid medicine Some medications can hide the warning symptoms of low blood sugar. You may need to monitor your blood sugar more closely if you are taking one of these medications. These include: -beta-blockers such as atenolol, metoprolol, propranolol -clonidine -guanethidine -reserpine This list may not describe all possible interactions. Give your health care provider a list of all the medicines, herbs, non-prescription drugs, or dietary supplements you use. Also tell them if you smoke, drink alcohol, or use illegal drugs. Some items may interact with your medicine. What should I watch for while using this medicine? Visit your health care professional or doctor for regular checks on your progress. A test called the HbA1C (A1C) will be monitored. This is a simple blood test. It measures your blood sugar control over the last 2 to 3 months. You will receive this test every 3 to 6 months. Learn how  to check your blood sugar. Learn the symptoms of low and high blood sugar and how to manage them. Always carry a quick-source of sugar with you in case you have symptoms of low blood sugar. Examples include hard sugar candy or glucose tablets. Make sure others know that you can choke if you eat or drink when you develop serious symptoms of low blood sugar, such as seizures or unconsciousness. They must get medical help at once. Tell your doctor or health care professional if you have high blood sugar. You might need to change the dose of your medicine. If you are sick or exercising more than usual, you might need to change the dose of your medicine. Do not skip meals. Ask your doctor or health care professional if you should avoid alcohol. Many nonprescription cough and cold products contain sugar or alcohol. These can affect blood sugar. Make sure that you have the right kind of syringe for the type of insulin you use. Try not to change the brand and type of insulin or syringe unless your health care professional or doctor tells you to. Switching insulin brand or type can cause dangerously high or low blood sugar. Always keep an extra supply of insulin, syringes, and needles on hand. Use a syringe one time only. Throw away syringe and needle in a closed container to prevent accidental needle sticks. Insulin pens and cartridges should never be shared. Even if the needle is changed, sharing may result in passing of viruses like hepatitis or HIV. Wear a medical ID bracelet or chain, and carry a card that describes your disease and details of your medicine and dosage times. What side effects may I notice from receiving this medicine? Side effects that you should report to your health care professional or doctor as soon as possible: -allergic reactions like skin rash, itching or hives, swelling of the face, lips, or tongue -breathing problems -signs and symptoms of high blood sugar such as dizziness, dry  mouth, dry skin, fruity breath, nausea, stomach pain, increased hunger or thirst, increased urination -signs and symptoms of low blood sugar such as feeling anxious, confusion, dizziness, increased hunger, unusually weak or tired, sweating, shakiness, cold, irritable, headache, blurred vision, fast heartbeat, loss of consciousness Side effects that usually  do not require medical attention (report to your health care professional or doctor if they continue or are bothersome): -increase or decrease in fatty tissue under the skin due to overuse of a particular injection site -itching, burning, swelling, or rash at site where injected This list may not describe all possible side effects. Call your doctor for medical advice about side effects. You may report side effects to FDA at 1-800-FDA-1088. Where should I keep my medicine? Keep out of the reach of children. Store unopened insulin vials in a refrigerator between 2 and 8 degrees C (36 and 46 degrees F). Do not freeze or use if the insulin has been frozen. Opened vials (vials currently in use) may be stored in the refrigerator or at room temperature, at approximately 30 degrees C (86 degrees F) or cooler. Keeping your insulin at room temperature decreases the amount of pain during injection. Once opened, your insulin can be used for 28 days. After 28 days, the vial of insulin should be thrown away. Store unopened cartridges, FlexPens, or Novalog Innolet systems in a refrigerator between 2 and 8 degrees C (36 and 46 degrees F.) Do not freeze or use if the insulin has been frozen. Once opened, the Novalog Innolet system, FlexPen, and cartridges that are inserted into pens should be kept at room temperature, approximately 25 degrees C (77 degrees F) or cooler. Do not store in the refrigerator. Once opened, the insulin can be used for 28 days. After 28 days, the cartridge, Novalog Innolet system or FlexPen should be thrown away. Protect from light and excessive  heat. Throw away any unused medicine after the expiration date or after the specified time for room temperature storage has passed. NOTE: This sheet is a summary. It may not cover all possible information. If you have questions about this medicine, talk to your doctor, pharmacist, or health care provider.    2016, Elsevier/[employer]. (2013-11-12 10:23:01)

## 2015-07-25 NOTE — Evaluation (Signed)
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    Clinical Impression   Pt is functioning at an independent level in ADL. Did not identify any impairment of cognition.  Pt appearing to appreciate the gravity of her diagnosis and at times she was tearful. No further OT needs.    Follow Up Recommendations  No OT follow up    Equipment Recommendations  None recommended by OT    Recommendations for Other Services       Precautions / Restrictions Precautions Precautions: None Restrictions Weight Bearing Restrictions: No      Mobility Bed Mobility Overal bed mobility: Independent                Transfers Overall transfer level: Independent Equipment used: None                  Balance Overall balance assessment: Independent                                     ADL Overall ADL's : Independent                                       General ADL Comments: Pt demonstrated ability to performing grooming, dressing and toileting independently.     Vision     Perception     Praxis      Pertinent Vitals/Pain Pain Assessment: Faces Faces Pain Scale: Hurts little more Pain Location: chronic back Pain Descriptors / Indicators: Aching Pain Intervention(s): Monitored during session     Hand Dominance Right   Extremity/Trunk Assessment Upper Extremity Assessment Upper Extremity Assessment: Overall WFL for tasks assessed   Lower Extremity Assessment Lower Extremity Assessment: Defer to PT evaluation   Cervical / Trunk Assessment Cervical / Trunk Assessment: Normal (hx of back pain)   Communication Communication Communication: No  difficulties   Cognition Arousal/Alertness: Awake/alert Behavior During Therapy: Anxious (tearful at times) Overall Cognitive Status: Within Functional Limits for tasks assessed                     General Comments       Exercises       Shoulder Instructions      Home Living Family/patient expects to be discharged to:: Private residence Living Arrangements: Parent;Children (mother and 3 y.o. 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    OT Diagnosis: Acute pain   OT Problem List:     OT Treatment/Interventions:      OT Goals(Current goals can be  found in the care plan section) Acute Rehab OT Goals Patient Stated Goal: to be able to spend as much time with her grandaughter as possible  OT Frequency:     Barriers to D/C:            Co-evaluation              End of Session    Activity Tolerance: Patient tolerated treatment well Patient left: in bed;with call bell/phone within reach;with family/visitor present   Time: [id number] OT Time Calculation (min): 18 min Charges:  OT General Charges $OT Visit: 1 Procedure OT Evaluation $Initial OT Evaluation Tier I: 1 Procedure G-Codes:    Malka So 07/25/2015, 1:09 PM  [phone]

## 2015-07-25 NOTE — Progress Notes (Signed)
Initial Nutrition Assessment  DOCUMENTATION CODES:   Obesity unspecified  INTERVENTION:    Boost Plus PO once daily, each supplement provides 360 kcal and 14 gm protein  NUTRITION DIAGNOSIS:   Inadequate oral intake related to dysphagia as evidenced by per patient/family report.  GOAL:   Patient will meet greater than or equal to 90% of their needs  MONITOR:   PO intake, Labs, Weight trends, Supplement acceptance  REASON FOR ASSESSMENT:   Malnutrition Screening Tool    ASSESSMENT:   59 y.o. female with PMH of tobacco abuse, right adrenal gland nodule, MVP, fibromyalgia, vitamin D deficiency, hypertension, diet-controlled diabetes, GERD, hypothyroidism, who presents with right facial droop, slurred speech and difficulty swallowing. 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   Patient reports that she has been eating okay at home, but she has been eating less recently. 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 She is glad that she has lost weight. Weight loss of 8% over the past 6 months to a year is insignificant. Patient has had some trouble swallowing, starting 16 years ago when she had to have her esophagus dilated. S/P swallow evaluation with SLP today and diet has been advanced to regular. Reviewed menu options with patient.   Diet Order:  Diet regular Room service appropriate?: Yes; Fluid consistency:: Thin  Skin:  Reviewed, no issues  Last BM:  11/21  Height:   Ht Readings from Last 1 Encounters:  07/24/15 5\' 2"  (1.575 m)    Weight:   Wt Readings from Last 1 Encounters:  07/24/15 187 lb 13.3 oz (85.2 kg)    Ideal Body Weight:  50 kg  BMI:  Body mass index is 34.35 kg/(m^2).  Estimated Nutritional Needs:   Kcal:  1700-1900  Protein:  85-100 gm  Fluid:  1.7-1.9 L  EDUCATION NEEDS:   Education needs addressed  Molli Barrows, [place], [place], [place] Pager [phone] After Hours Pager [phone]

## 2015-07-25 NOTE — Discharge Summary (Signed)
Physician Discharge Summary  Michelle Duran [id number] DOB: 1956-08-23 DOA: 07/24/2015  PCP: No primary care provider on file.  Admit date: 07/24/2015 Discharge date: 07/25/2015  Time spent: 25 minutes  Recommendations for Outpatient Follow-up:  Discharge home with surgery scheduled for 11/29 with Dr Cyndy Freeze.   Discharge Diagnoses:  Principal Problem:   Brain mass   Active Problems:   Hypothyroidism   Panic disorder   Essential hypertension   Mitral valve disorder   GERD   Facial droop   Slurred speech  Hypothyroidism   Discharge Condition:fair  Diet recommendation: regular  Filed Weights   07/24/15 1521 07/24/15 2310  Weight: 86.24 kg (190 lb 2 oz) 85.2 kg (187 lb 13.3 oz)    History of present illness:  59 year old female with history of tobacco abuse, right abdomen, nodule, MVP, fibromyalgia hypertension, diet-controlled diabetes, GERD, hypothyroidism presented to the ED with right facial droop, slurred speech and difficulty swallowing. Her mother noticed her to have right facial droop and slurred speech for the past 3 days. She was also having difficulty swallowing with occipital headaches. She reports losing about 20 pounds over one month.. Denies any focal weakness, numbness or tingling. Denies any blurred vision. Denied fever, chills, seizure activity, chest pain, cough, fever, shortness of breath, chest, palpitations, abdominal pain, bowel or urinary symptoms.  In the ED vitals were stable except for mild bradycardia. Blood work unremarkable. She had an MRI of her brain showing Right frontal lobe 4 x 4.7 x 3.7 cm mass with significant surrounding vasogenic edema. Anterior to mid right temporal lobe 4.4 x 2.6 x 3.9 cm mass with significant surrounding vasogenic edema. Mass effect upon the right lateral ventricle with midline shift to the left by 7.3 mm. Early trapping of the left temporal horn not excluded. Compression of the right midbrain/ cerebral peduncle with  mild right uncal herniation. The vasogenic edema surrounding the enhancing lesions is confluent through the subinsular/external capsule region. No confluence of the enhancing component. Question primary multi focal glioblastoma versus metastatic disease.  Neurosurgery consulted and patient admitted to stepdown unit. Started on IV Decadron and Keppra.   Hospital Course:  Brain mass with surrounding vasogenic edema (right frontal lobe and mid right temporal lobe).  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 CT of the chest, abdomen and pelvis negative for primary. Patient started on Decadron and Keppra. Symptoms mostly resolved. Cleared her swallowing evaluation. Seen by physical therapy and no further recommendations. Dr. Cyndy Freeze  recommends MRI with stereotactic protocol and DTI. Plan on surgery on 11/29. Recommend patient and be discharged after MRI and will arrange for surgery. She lay discharged on Keppra, Decadron and Protonix.  Tobacco abuse Answer on cessation. Order nicotine patch.  Essential  hypertension Stable. Resume home meds ( has mild bradycardia on BB)  Hypothyroidism Continue Synthroid.  Patient will be discharged home after Sterotactic MRI done.  Diet: Regular  CODE STATUS: Full code  Family communication: Mother at bedside  Procedures:  MRI brain  CT chest abdomen and pelvis    Consultations:  Dr Cyndy Freeze ( neurosurgery)  Discharge Exam: Filed Vitals:   07/25/15 1328 07/25/15 1448  BP:    Pulse:    Temp: 97.6 F (36.4 C) 98.4 F (36.9 C)  Resp:      General: Middle aged female not in distress HEENT: No pallor, moist oral mucosa Chest: Clear to auscultation bilaterally CVS: Normal S1 and S2, no murmurs rub or gallop GI: Soft, nondistended, nontender, bowel sounds present Musculoskeletal: Warm, no edema CNS:  Alert and oriented, speech normal. mild left facial droop, cranial nerves II-12 was unremarkable, no focal deficit   Discharge  Instructions    Current Discharge Medication List    START taking these medications   Details  dexamethasone (DECADRON) 4 MG tablet Take 1 tablet (4 mg total) by mouth 4 (four) times daily. Qty: 28 tablet, Refills: 0    lactose free nutrition (BOOST PLUS) LIQD Take 237 mLs by mouth daily. Qty: 30 Can, Refills: 0    levETIRAcetam (KEPPRA) 750 MG tablet Take 1 tablet (750 mg total) by mouth 2 (two) times daily. Qty: 30 tablet, Refills: 0    nicotine (NICODERM CQ - DOSED IN MG/24 HOURS) 21 mg/24hr patch Place 1 patch (21 mg total) onto the skin daily. Qty: 28 patch, Refills: 0    pantoprazole (PROTONIX) 40 MG tablet Take 1 tablet (40 mg total) by mouth daily. Switch for any other PPI at similar dose and frequency Qty: 10 tablet, Refills: 0      CONTINUE these medications which have NOT CHANGED   Details  acetaminophen (TYLENOL) 650 MG CR tablet Take 1,300 mg by mouth every 8 (eight) hours as needed for pain.    ALPRAZolam (XANAX) 0.5 MG tablet Take 0.25 mg by mouth 2 (two) times daily.    levothyroxine (SYNTHROID, LEVOTHROID) 50 MCG tablet Take 25 mcg by mouth daily.    lisinopril (PRINIVIL,ZESTRIL) 5 MG tablet Take 5 mg by mouth every evening.    metoprolol succinate (TOPROL-XL) 25 MG 24 hr tablet Take 12.5 mg by mouth every evening.    omeprazole (PRILOSEC) 20 MG capsule Take 40 mg by mouth daily.     sertraline (ZOLOFT) 100 MG tablet Take 100 mg by mouth every evening.       Allergies  Allergen Reactions  . Codeine     REACTION: vomiting  . Prednisone     Panic attacks  . Sulfonamide Derivatives     REACTION: nausea   Follow-up Information    Follow up with Tamala Fothergill, MD On 08/01/2015.   Specialty:  Neurosurgery   Why:  for surgery   Contact information:   [place] [address] [phone]        The results of significant diagnostics from this hospitalization (including imaging, microbiology, ancillary and  laboratory) are listed below for reference.    Significant Diagnostic Studies: Ct Chest W Contrast  07/25/2015  CLINICAL DATA:  Recently diagnosed brain masses. Assess for primary malignancy. Initial encounter. EXAM: CT CHEST, ABDOMEN, AND PELVIS WITH CONTRAST TECHNIQUE: Multidetector CT imaging of the chest, abdomen and pelvis was performed following the standard protocol during bolus administration of intravenous contrast. CONTRAST:  157mL OMNIPAQUE IOHEXOL 300 MG/ML  SOLN COMPARISON:  CT of the abdomen and pelvis performed 02/02/2010, and chest radiograph performed 11/25/2010 FINDINGS: CT CHEST FINDINGS Minimal bibasilar atelectasis is noted. The lungs are otherwise clear. There is no evidence of focal consolidation, pleural effusion or pneumothorax. No masses are seen. The mediastinum is unremarkable in appearance. No mediastinal lymphadenopathy is seen. No pericardial effusions identified. The great vessels are grossly unremarkable in appearance. The thyroid gland is grossly unremarkable. No axillary lymphadenopathy is appreciated. No acute osseous abnormalities are identified. CT ABDOMEN PELVIS FINDINGS The liver and spleen are unremarkable in appearance. The gallbladder is within normal limits. The pancreas and left adrenal gland are unremarkable. A 1.3 cm right adrenal nodule is stable from 2011 and likely benign. The kidneys are unremarkable in appearance.  There is no evidence of hydronephrosis. No renal or ureteral stones are seen. No perinephric stranding is appreciated. No free fluid is identified. The small bowel is unremarkable in appearance. The stomach is within normal limits. No acute vascular abnormalities are seen. The appendix is normal in caliber and contains air, without evidence for appendicitis. The colon is partially filled with contrast, and is unremarkable in appearance. The bladder is mildly distended and grossly unremarkable. The uterus is grossly unremarkable in appearance. The  ovaries are relatively symmetric. No suspicious adnexal masses are seen. No inguinal lymphadenopathy is seen. No acute osseous abnormalities are identified. Facet disease is noted at the lower lumbar spine. IMPRESSION: 1. No acute abnormality seen within the chest, abdomen or pelvis. No evidence for primary malignancy. 2. 1.3 cm right adrenal nodule is stable from 2011 and likely benign. 3. Minimal bibasilar atelectasis noted.  Lungs otherwise clear. Electronically Signed   By: Garald Balding M.D.   On: 07/25/2015 02:33   Mr Jeri Cos [id number] Contrast  07/24/2015  CLINICAL DATA:  59 year old diabetic female presenting with facial droop and slurred speech with onset 3 days ago. Initial encounter. EXAM: MRI HEAD WITHOUT AND WITH CONTRAST TECHNIQUE: Multiplanar, multiecho pulse sequences of the brain and surrounding structures were obtained without and with intravenous contrast. CONTRAST:  29mL MULTIHANCE GADOBENATE DIMEGLUMINE 529 MG/ML IV SOLN COMPARISON:  06/23/2006 head CT.  No comparison brain MR. FINDINGS: Right frontal lobe 4 x 4.7 x 3.7 cm irregular necrotic possibly partially hemorrhagic mass with significant surrounding vasogenic edema. Anterior to mid right temporal lobe 4.4 x 2.6 x 3.9 cm irregular enhancing possibly necrotic/ hemorrhagic mass with significant surrounding vasogenic edema. Mass effect upon the right lateral ventricle with midline shift to the left by 7.3 mm. Early trapping of the left temporal horn not excluded. Compression of the right midbrain/ cerebral peduncle is with mild right uncal herniation. The vasogenic edema surrounding the enhancing lesions is confluent through the subinsular/external capsule region. No confluence of the enhancing component. It is possible this represents presence of metastatic disease although primary multi focal glioma blastoma is also consideration. No MR evidence to suggest infection. No acute thrombotic infarct. Partial opacification left mastoid air cells  without obstructing lesion noted. Minimal mucosal thickening ethmoid sinus air cells. Major intracranial vascular structures are patent. Decreased signal intensity of bone marrow may be related to patient's habitus. Correlation with CBC to exclude anemia contributing to this appearance may be considered IMPRESSION: Right frontal lobe 4 x 4.7 x 3.7 cm mass with significant surrounding vasogenic edema. Anterior to mid right temporal lobe 4.4 x 2.6 x 3.9 cm mass with significant surrounding vasogenic edema. Mass effect upon the right lateral ventricle with midline shift to the left by 7.3 mm. Early trapping of the left temporal horn not excluded. Compression of the right midbrain/ cerebral peduncle with mild right uncal herniation. The vasogenic edema surrounding the enhancing lesions is confluent through the subinsular/external capsule region. No confluence of the enhancing component. Question primary multi focal glioblastoma versus metastatic disease. These results were called by telephone at the time of interpretation on 07/24/2015 at 7:40 pm to Dr. Nathaniel Man , who verbally acknowledged these results. Electronically Signed   By: Genia Del M.D.   On: 07/24/2015 19:55   Ct Abdomen Pelvis W Contrast  07/25/2015  CLINICAL DATA:  Recently diagnosed brain masses. Assess for primary malignancy. Initial encounter. EXAM: CT CHEST, ABDOMEN, AND PELVIS WITH CONTRAST TECHNIQUE: Multidetector CT imaging of the chest, abdomen and  pelvis was performed following the standard protocol during bolus administration of intravenous contrast. CONTRAST:  171mL OMNIPAQUE IOHEXOL 300 MG/ML  SOLN COMPARISON:  CT of the abdomen and pelvis performed 02/02/2010, and chest radiograph performed 11/25/2010 FINDINGS: CT CHEST FINDINGS Minimal bibasilar atelectasis is noted. The lungs are otherwise clear. There is no evidence of focal consolidation, pleural effusion or pneumothorax. No masses are seen. The mediastinum is unremarkable in  appearance. No mediastinal lymphadenopathy is seen. No pericardial effusions identified. The great vessels are grossly unremarkable in appearance. The thyroid gland is grossly unremarkable. No axillary lymphadenopathy is appreciated. No acute osseous abnormalities are identified. CT ABDOMEN PELVIS FINDINGS The liver and spleen are unremarkable in appearance. The gallbladder is within normal limits. The pancreas and left adrenal gland are unremarkable. A 1.3 cm right adrenal nodule is stable from 2011 and likely benign. The kidneys are unremarkable in appearance. There is no evidence of hydronephrosis. No renal or ureteral stones are seen. No perinephric stranding is appreciated. No free fluid is identified. The small bowel is unremarkable in appearance. The stomach is within normal limits. No acute vascular abnormalities are seen. The appendix is normal in caliber and contains air, without evidence for appendicitis. The colon is partially filled with contrast, and is unremarkable in appearance. The bladder is mildly distended and grossly unremarkable. The uterus is grossly unremarkable in appearance. The ovaries are relatively symmetric. No suspicious adnexal masses are seen. No inguinal lymphadenopathy is seen. No acute osseous abnormalities are identified. Facet disease is noted at the lower lumbar spine. IMPRESSION: 1. No acute abnormality seen within the chest, abdomen or pelvis. No evidence for primary malignancy. 2. 1.3 cm right adrenal nodule is stable from 2011 and likely benign. 3. Minimal bibasilar atelectasis noted.  Lungs otherwise clear. Electronically Signed   By: Garald Balding M.D.   On: 07/25/2015 02:33    Microbiology: Recent Results (from the past 240 hour(s))  MRSA PCR Screening     Status: None   Collection Time: 07/24/15 11:30 PM  Result Value Ref Range Status   MRSA by PCR NEGATIVE NEGATIVE Final    Comment:        The GeneXpert MRSA Assay (FDA approved for NASAL specimens only),  is one component of a comprehensive MRSA colonization surveillance program. It is not intended to diagnose MRSA infection nor to guide or monitor treatment for MRSA infections.      Labs: Basic Metabolic Panel:  Recent Labs Lab 07/24/15 1530 07/24/15 1659 07/25/15 0418  NA 139 141 137  K 4.0 4.0 3.9  CL 106 104 105  CO2 25  --  25  GLUCOSE 142* 130* 230*  BUN 12 13 9   CREATININE 0.94 0.90 0.83  CALCIUM 9.1  --  9.2   Liver Function Tests:  Recent Labs Lab 07/24/15 1530  AST 21  ALT 22  ALKPHOS 112  BILITOT 0.2*  PROT 6.5  ALBUMIN 3.7   No results for input(s): LIPASE, AMYLASE in the last 168 hours. No results for input(s): AMMONIA in the last 168 hours. CBC:  Recent Labs Lab 07/24/15 1530 07/24/15 1659 07/25/15 0418  WBC 11.8*  --  9.9  NEUTROABS 6.8  --   --   HGB 14.3 15.0 14.6  HCT 42.2 44.0 42.9  MCV 92.1  --  91.5  PLT 372  --  342   Cardiac Enzymes: No results for input(s): CKTOTAL, CKMB, CKMBINDEX, TROPONINI in the last 168 hours. BNP: BNP (last 3 results) No results for  input(s): BNP in the last 8760 hours.  ProBNP (last 3 results) No results for input(s): PROBNP in the last 8760 hours.  CBG: No results for input(s): GLUCAP in the last 168 hours.     SignedLouellen Molder  [facility] 07/25/2015, 4:07 PM

## 2015-07-25 NOTE — Evaluation (Signed)
Physical Therapy Evaluation and Discharge   Patient Details Name: Michelle Duran MRN: [id number] DOB: [birth year] Today's Date: 07/25/2015   History of Present Illness  Pt is a 59 y/o F who presented to ER w/ Lt facial droop and slurred speech and was found to have a brain mass in the Rt frontal lobe and the Rt parietal lobe.   Clinical Impression  Pt admitted with above diagnosis. No acute skilled PT needs identified.  Pt able to tolerate challenges to balance during ambulation and is at her baseline level of mobility.  Chaplain contacted per pt's request, to see this afternoon.  PT is signing off.    Follow Up Recommendations No PT follow up    Equipment Recommendations  None recommended by PT    Recommendations for Other Services       Precautions / Restrictions Restrictions Weight Bearing Restrictions: No      Mobility  Bed Mobility Overal bed mobility: Independent                Transfers Overall transfer level: Independent Equipment used: None                Ambulation/Gait Ambulation/Gait assistance: Independent Ambulation Distance (Feet): 400 Feet Assistive device: None Gait Pattern/deviations: WFL(Within Functional Limits)   Gait velocity interpretation: at or above normal speed for age/gender General Gait Details: WFL.  No physical assist needed.  Stairs Stairs: Yes Stairs assistance: Independent Stair Management: One rail Right;Step to pattern;Forwards Number of Stairs: 10 General stair comments: No cues or physical assist needed.    Wheelchair Mobility    Modified Rankin (Stroke Patients Only) Modified Rankin (Stroke Patients Only) Pre-Morbid Rankin Score: No symptoms Modified Rankin: No significant disability     Balance Overall balance assessment: Independent                               Standardized Balance Assessment Standardized Balance Assessment : Dynamic Gait Index   Dynamic Gait Index Level Surface:  Normal Change in Gait Speed: Normal Gait with Horizontal Head Turns: Normal Gait with Vertical Head Turns: Normal Gait and Pivot Turn: Normal Step Over Obstacle: Normal Steps: Moderate Impairment (uses rail and step to pattern)       Pertinent Vitals/Pain Pain Assessment: Faces Faces Pain Scale: Hurts little more Pain Location: back and Rt foot (arthritis in her back at baseline per pt) Pain Descriptors / Indicators: Aching;Nagging;Pins and needles (pins and needles in Rt foot, her baseline) Pain Intervention(s): Limited activity within patient's tolerance;Monitored during session    Home Living Family/patient expects to be discharged to:: Private residence Living Arrangements: Parent;Children (20 y/o child) Available Help at Discharge: Family;Available 24 hours/day Type of Home: House ([place]) Home Access: Stairs to enter Entrance Stairs-Rails: None Entrance Stairs-Number of Steps: 1 Home Layout: Two level Home Equipment: Cane - single point;Bedside commode      Prior Function Level of Independence: Independent               Hand Dominance        Extremity/Trunk Assessment   Upper Extremity Assessment: Overall WFL for tasks assessed           Lower Extremity Assessment: Overall WFL for tasks assessed (Rt LE aching and pins/needles likely related to back pain)         Communication   Communication: No difficulties  Cognition Arousal/Alertness: Awake/alert Behavior During Therapy: Anxious;WFL for tasks assessed/performed (emotional 2/2  new diagnosis) Overall Cognitive Status: Within Functional Limits for tasks assessed                      General Comments General comments (skin integrity, edema, etc.): Notifed chaplain that pt would like their support, chaplain planning to visit later today.    Exercises        Assessment/Plan    PT Assessment Patent does not need any further PT services  PT Diagnosis Generalized weakness   PT  Problem List    PT Treatment Interventions     PT Goals (Current goals can be found in the Care Plan section) Acute Rehab PT Goals Patient Stated Goal: to be able to spend as much time with her grandaughter as possible PT Goal Formulation: All assessment and education complete, DC therapy    Frequency     Barriers to discharge        Co-evaluation               End of Session Equipment Utilized During Treatment: Gait belt Activity Tolerance: Patient tolerated treatment well Patient left: in bed;with call bell/phone within reach;with family/visitor present Nurse Communication: Mobility status         Time: [id number] PT Time Calculation (min) (ACUTE ONLY): 26 min   Charges:   PT Evaluation $Initial PT Evaluation Tier I: 1 Procedure PT Treatments $Gait Training: 8-22 mins   PT G CodesJoslyn Hy PT, DPT [phone] Pager: [phone] 07/25/2015, 12:36 PM

## 2015-07-25 NOTE — Evaluation (Signed)
Clinical/Bedside Swallow Evaluation Patient Details  Name: Michelle Duran MRN: [id number] Date of Birth: 1955-12-20  Today's Date: 07/25/2015 Time: SLP Start Time (ACUTE ONLY): 1257 SLP Stop Time (ACUTE ONLY): 1320 SLP Time Calculation (min) (ACUTE ONLY): 23 min  Past Medical History:  Past Medical History  Diagnosis Date  . Gallstone   . Hypertension   . Hypothyroidism   . Mitral valve prolapse   . Fibromyalgia   . Anxiety   . Vitamin D deficiency   . Diabetes mellitus without complication ([place])   . Tobacco abuse   . Mass of adrenal gland [facility])    Past Surgical History: History reviewed. No pertinent past surgical history. HPI:  59 y/o F who presented to ER w/ Lt facial droop and slurred speech and was found to have a brain mass in the Rt frontal lobe and the Rt parietal lobe. She was started on Dexamethasone and Keppra and her symptoms have resolved. MRI pending with plan for D/C 11/22 and f/u surgery next week. Pt passed RN stroke screen, but reports difficulty swallowing and backflow of POs through nose, hence continued orders for swallow evaluation.     Assessment / Plan / Recommendation Clinical Impression  Pt presents with normal oropharyngeal swallow function with adequate mastication of solids, brisk swallow response, and no s/s of aspiration when consecutively consuming three ounces of water.  She describes occasional nasal emission of liquids; and upon assessment there is limited palatal elevation.  When bolus size is controlled, s/s do not occur.  Recommend resuming a regular diet and thin liquids; meds whole in liquid.  Pt understands s/s of dysphagia about which to be aware s/p surgery.  No further needs - will sign off.      Aspiration Risk  No limitations    Diet Recommendation   regular, thin liquids  Medication Administration: Whole meds with liquid    Other  Recommendations Oral Care Recommendations: Oral care BID   Follow up Recommendations       Frequency  and Duration            Swallow Study   General Date of Onset: 07/24/15 HPI: 59 y/o F who presented to ER w/ Lt facial droop and slurred speech and was found to have a brain mass in the Rt frontal lobe and the Rt parietal lobe. She was started on Dexamethasone and Keppra and her symptoms have resolved. MRI pending with plan for D/C 11/22 and f/u surgery next week. Pt passed RN stroke screen, but reports difficulty swallowing and backflow of POs through nose, hence continued orders for swallow evaluation.   Type of Study: Bedside Swallow Evaluation Previous Swallow Assessment: no Diet Prior to this Study: NPO Temperature Spikes Noted: No Respiratory Status: Room air History of Recent Intubation: No Behavior/Cognition: Alert;Cooperative;Pleasant mood Oral Cavity Assessment: Within Functional Limits Oral Care Completed by SLP: No Oral Cavity - Dentition: Missing dentition Vision: Functional for self-feeding Self-Feeding Abilities: Able to feed self Patient Positioning: Upright in bed Baseline Vocal Quality:  (low pitch) Volitional Cough: Strong Volitional Swallow: Able to elicit    Oral/Motor/Sensory Function Overall Oral Motor/Sensory Function: Mild impairment Facial ROM: Reduced left;Suspected CN VII (facial) dysfunction Facial Symmetry: Suspected CN VII (facial) dysfunction;Abnormal symmetry left Velum: Suspected CN X (Vagus) dysfunction (minimal palatal elevation)   Ice Chips Ice chips: Within functional limits Presentation: Spoon   Thin Liquid Thin Liquid: Within functional limits Presentation: Cup    Nectar Thick Nectar Thick Liquid: Not tested   Honey  Thick Honey Thick Liquid: Not tested   Puree Puree: Within functional limits   Solid Solid: Within functional limits       Michelle Duran 07/25/2015,2:09 PM

## 2015-07-25 NOTE — Progress Notes (Signed)
UR COMPLETED  

## 2015-07-25 NOTE — Consult Note (Signed)
CC:  Chief Complaint  Patient presents with  . Facial Droop    HPI: Michelle Duran is a 59 y.o. left handed female who presented to the ER with left facial droop and slurred speech for three days.  She underwent an MRI which revealed two contrast enhancing masses, one in the right frontal lobe and one in the right temporal lobe.  She was started on Dexamethasone and Keppra and her symptoms have resolved.  She has not had any overt seizure activity.  She denies any other weakness or numbness.  She has had some occipital headaches.  PMH: Past Medical History  Diagnosis Date  . Gallstone   . Hypertension   . Hypothyroidism   . Mitral valve prolapse   . Fibromyalgia   . Anxiety   . Vitamin D deficiency   . Diabetes mellitus without complication ([place])   . Tobacco abuse   . Mass of adrenal gland ([place])     PSH: History reviewed. No pertinent past surgical history.  SH: Social History  Substance Use Topics  . Smoking status: Current Every Day Smoker -- 1.00 packs/day    Types: Cigarettes  . Smokeless tobacco: None  . Alcohol Use: No    MEDS: Prior to Admission medications   Medication Sig Start Date End Date Taking? Authorizing Provider  acetaminophen (TYLENOL) 650 MG CR tablet Take 1,300 mg by mouth every 8 (eight) hours as needed for pain.   Yes Historical Provider, MD  ALPRAZolam Duanne Moron) 0.5 MG tablet Take 0.25 mg by mouth 2 (two) times daily.   Yes Historical Provider, MD  levothyroxine (SYNTHROID, LEVOTHROID) 50 MCG tablet Take 25 mcg by mouth daily.   Yes Historical Provider, MD  lisinopril (PRINIVIL,ZESTRIL) 5 MG tablet Take 5 mg by mouth every evening.   Yes Historical Provider, MD  metoprolol succinate (TOPROL-XL) 25 MG 24 hr tablet Take 12.5 mg by mouth every evening.   Yes Historical Provider, MD  omeprazole (PRILOSEC) 20 MG capsule Take 40 mg by mouth daily.    Yes Historical Provider, MD  sertraline (ZOLOFT) 100 MG tablet Take 100 mg by mouth every evening.   Yes  Historical Provider, MD    ALLERGY: Allergies  Allergen Reactions  . Codeine     REACTION: vomiting  . Prednisone     Panic attacks  . Sulfonamide Derivatives     REACTION: nausea    ROS: ROS Headache, facial droop, slurred speech.  NEUROLOGIC EXAM: Awake, alert, oriented Memory and concentration grossly intact Speech fluent, appropriate CN grossly intact Motor exam: Upper Extremities Deltoid Bicep Tricep Grip  Right 5/5 5/5 5/5 5/5  Left 5/5 5/5 5/5 5/5   Lower Extremity IP Quad PF DF EHL  Right 5/5 5/5 5/5 5/5 5/5  Left 5/5 5/5 5/5 5/5 5/5   Sensation grossly intact to LT  IMAGING: MRI Brain: Right frontal and temporal contrast enhancing masses with surrounding vasogenic edema and right to left midline shift.  IMPRESSION: - 59 y.o. female with two contrast enhancing masses.  She is neurologically intact now that she is on steroids and Keppra. 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  PLAN: - MRI with stereotactic protocol and DTI - Surgery next Tuesday - [place] to D/c after MRI, we will make arrangements for surgery - D/c with Keppra 750 bid, dexamethasone 4q6, protonix - I will review imaging with patient this afternoon

## 2015-07-26 DIAGNOSIS — F411 Generalized anxiety disorder: Secondary | ICD-10-CM

## 2015-07-26 DIAGNOSIS — Z720 Tobacco use: Secondary | ICD-10-CM

## 2015-07-26 DIAGNOSIS — E038 Other specified hypothyroidism: Secondary | ICD-10-CM

## 2015-07-26 DIAGNOSIS — F329 Major depressive disorder, single episode, unspecified: Secondary | ICD-10-CM

## 2015-07-26 DIAGNOSIS — F32A Depression, unspecified: Secondary | ICD-10-CM | POA: Diagnosis present

## 2015-07-26 DIAGNOSIS — E139 Other specified diabetes mellitus without complications: Secondary | ICD-10-CM

## 2015-07-26 DIAGNOSIS — C719 Malignant neoplasm of brain, unspecified: Secondary | ICD-10-CM | POA: Diagnosis present

## 2015-07-26 LAB — GLUCOSE, CAPILLARY
GLUCOSE-CAPILLARY: 293 mg/dL — AB (ref 65–99)
GLUCOSE-CAPILLARY: 192 mg/dL — AB (ref 65–99)
Glucose-Capillary: 227 mg/dL — ABNORMAL HIGH (ref 65–99)
Glucose-Capillary: 176 mg/dL — ABNORMAL HIGH (ref 65–99)
Glucose-Capillary: 221 mg/dL — ABNORMAL HIGH (ref 65–99)

## 2015-07-26 MED ORDER — SERTRALINE HCL 50 MG PO TABS
150.0000 mg | ORAL_TABLET | Freq: Every evening | ORAL | Status: DC
  Administered 2015-07-26: 150 mg via ORAL
  Filled 2015-07-26 (×2): qty 1

## 2015-07-26 MED ORDER — INSULIN GLARGINE 100 UNIT/ML ~~LOC~~ SOLN
10.0000 [IU] | SUBCUTANEOUS | Status: DC
  Administered 2015-07-26: 10 [IU] via SUBCUTANEOUS
  Filled 2015-07-26 (×2): qty 0.1

## 2015-07-26 MED ORDER — INSULIN ASPART 100 UNIT/ML ~~LOC~~ SOLN
0.0000 [IU] | SUBCUTANEOUS | Status: DC
  Administered 2015-07-26 (×2): 3 [IU] via SUBCUTANEOUS
  Administered 2015-07-26: 8 [IU] via SUBCUTANEOUS
  Administered 2015-07-27: 3 [IU] via SUBCUTANEOUS
  Administered 2015-07-27: 5 [IU] via SUBCUTANEOUS
  Administered 2015-07-27: 3 [IU] via SUBCUTANEOUS

## 2015-07-26 NOTE — Progress Notes (Signed)
Pt returned from MRI at 2300. Pt and her mother are concerned about driving home so late and would like to discharge in the morning.

## 2015-07-26 NOTE — Care Management Note (Signed)
Case Management Note  Patient Details  Name: Michelle Duran MRN: [id number] Date of Birth: 12-28-1955  Subjective/Objective:                 Admitted with brain tumors. From home with family(mom/son). Independent with ADL's prior to [facility] usage.  Action/Plan:  Discharge home with surgery scheduled for 11/29 with Dr Cyndy Freeze.  Expected Discharge Date:                  Expected Discharge Plan:  Home/Self Care  In-House Referral:     Discharge planning Services  CM Consult  Post Acute Care Choice:    Choice offered to:     DME Arranged:    DME Agency:     HH Arranged:    [place] Agency:     Status of Service:  Completed, signed off  Medicare Important Message Given:    Date Medicare IM Given:    Medicare IM give by:    Date Additional Medicare IM Given:    Additional Medicare Important Message give by:     If discussed at [place] of Stay Meetings, dates discussed:    Additional Comments: Emilia Beck (Mother) [phone] (Son)  [phone]  [facility] [address] [place], [place] [phone] 07/26/2015, 10:11 AM

## 2015-07-26 NOTE — Progress Notes (Signed)
[facility] TEAM 1 - Stepdown/ICU TEAM Progress Note  Michelle Duran [id number] DOB: [birth year] DOA: 07/24/2015 PCP: No primary care provider on file.  Admit HPI / Brief Narrative: 59 y.o.WF PMHx Panic DO, Tobacco Abuse, Rt Adrenal Gland Nodule, MVP, Fibromyalgia, Vitamin D Deficiency, HTN, Diet-controlled DM Type 2, GERD, hypothyroidism.   Presents with right facial droop, slurred speech and difficulty swallowing.  Patient reports that she was noticed by her mother to have right facial droop, slurred speech in the past 3 days. She also has difficulty swallowing, and headache over occipital area. Patient states that she lost 20 pounds over one month recently. She does not have unilateral weakness, numbness or tingliness inextremities. She has left hand shaking when she smokes. She does not have chest pain, cough, fever, chills, abdominal pain, diarrhea, symptoms of UTI. She has chronic lower back and right foot pain which have been going on for long time, and was told to have arthritis.  In ED, patient was found to have WBC 11.8, INR 0.99, PTT 30, temperature normal, slightly bradycardia, electrolyte and renal function okay. Patient is admitted to inpatient for further evaluation and treatment. Neurosurgeon, Dr. Cyndy Freeze was consulted to the ED, will see pt in the morning.  HPI/Subjective: 11/23 A/O 4, NAD, negative N/V, negative headache, negative vision changes, negative hearing changes  Assessment/Plan: Brain mass:  -Not clear whether patient has primary brain tumor or metastasized tumor. Patient is symptomatic.  -MRI of her brain showed mass effect upon the right lateral ventricle with midline shift to the left by 7.3 mm. -Neurosurgery, Dr. Cyndy Freeze recommended to get further image tests to explore primary tumors. -Continue Keppra 750 mg BID -Continue Decadron 4 mg QID -Surgery scheduled for 11/29 with Dr Cyndy Freeze  Hypothyroidism:  -Continue home Synthroid 25 g daily -Check TSH  WNL  Essential hypertension: -Metoprolol XL 12.5 mg daily -lisinopril 5 mg daily  GERD: -Protonix 40 mg daily  Depression and Anxiety:  -Stable, no suicidal or homicidal ideations. - Xanax 0.25 mg BID (home dose) -Increase Zoloft 150 mg daily; given patient's current situation.  Tobacco abuse: -Did counseling about importance of quitting smoking -Nicotine patch  Diabetes iatrogenic -Patient hyperglycemic secondary to steroids. -Start Lantus 10 units daily    Code Status: FULL Family Communication: family present at time of exam Disposition Plan: DC in a.m.    Consultants: Dr.Benjamin Kevan Ny Ditty (neurosurgery)    Procedure/Significant Events: 11/21 MRI brain W/WO contrast;Right frontal lobe 4 x 4.7 x 3.7 cm mass with significant surrounding vasogenic edema. -Anterior to mid right temporal lobe 4.4 x 2.6 x 3.9 cm mass with vasogenic edema. -Mass effect upon the right lateral ventricle;midline shift to left by 7.3 mm. Early trapping of the left temporal horn not excluded. -Compression of the right midbrain/ cerebral peduncle with mild right uncal herniation. 11/22 CT chest W/contrast;mediastinum is unremarkable in appearance. No mediastinal lymphadenopathy  11/22 CT abdomen and pelvis;1.3 cm right adrenal nodule is stable from 2011 and likely benign.   Culture NA  Antibiotics: NA  DVT prophylaxis: SCD   Devices    LINES / TUBES:  NA    Continuous Infusions: . sodium chloride 100 mL/hr at 07/25/15 1900    Objective: VITAL SIGNS: Temp: 98.1 F (36.7 C) (11/23 1519) Temp Source: Oral (11/23 1519) BP: 119/62 mmHg (11/23 1520) Pulse Rate: 59 (11/23 1520) SPO2; FIO2:   Intake/Output Summary (Last 24 hours) at 07/26/15 1751 Last data filed at 07/26/15 1030  Gross per 24 hour  Intake  460 ml  Output      0 ml  Net    460 ml     Exam: General: A/O 4, NAD, No acute respiratory distress Eyes: Negative headache, eye pain, double  vision,negative scleral hemorrhage ENT: Negative Runny nose, negative ear pain, negative gingival bleeding, Neck:  Negative scars, masses, torticollis, lymphadenopathy, JVD Lungs: Clear to auscultation bilaterally without wheezes or crackles Cardiovascular: Regular rate and rhythm without murmur gallop or rub normal S1 and S2 Abdomen:negative abdominal pain, nondistended, positive soft, bowel sounds, no rebound, no ascites, no appreciable mass Extremities: No significant cyanosis, clubbing, or edema bilateral lower extremities Psychiatric:  Negative depression, negative anxiety, negative fatigue, negative mania, positive anxiety  Neurologic:  Cranial nerves II through XII intact, tongue/uvula midline, all extremities muscle strength 5/5, sensation intact throughout,  negative dysarthria, negative expressive aphasia, negative receptive aphasia.   Data Reviewed: Basic Metabolic Panel:  Recent Labs Lab 07/24/15 1530 07/24/15 1659 07/25/15 0418  NA 139 141 137  K 4.0 4.0 3.9  CL 106 104 105  CO2 25  --  25  GLUCOSE 142* 130* 230*  BUN 12 13 9   CREATININE 0.94 0.90 0.83  CALCIUM 9.1  --  9.2   Liver Function Tests:  Recent Labs Lab 07/24/15 1530  AST 21  ALT 22  ALKPHOS 112  BILITOT 0.2*  PROT 6.5  ALBUMIN 3.7   No results for input(s): LIPASE, AMYLASE in the last 168 hours. No results for input(s): AMMONIA in the last 168 hours. CBC:  Recent Labs Lab 07/24/15 1530 07/24/15 1659 07/25/15 0418  WBC 11.8*  --  9.9  NEUTROABS 6.8  --   --   HGB 14.3 15.0 14.6  HCT 42.2 44.0 42.9  MCV 92.1  --  91.5  PLT 372  --  342   Cardiac Enzymes: No results for input(s): CKTOTAL, CKMB, CKMBINDEX, TROPONINI in the last 168 hours. BNP (last 3 results) No results for input(s): BNP in the last 8760 hours.  ProBNP (last 3 results) No results for input(s): PROBNP in the last 8760 hours.  CBG:  Recent Labs Lab 07/26/15 0850 07/26/15 1245 07/26/15 1518 07/26/15 1650   GLUCAP 227* 176* 221* 293*    Recent Results (from the past 240 hour(s))  MRSA PCR Screening     Status: None   Collection Time: 07/24/15 11:30 PM  Result Value Ref Range Status   MRSA by PCR NEGATIVE NEGATIVE Final    Comment:        The GeneXpert MRSA Assay (FDA approved for NASAL specimens only), is one component of a comprehensive MRSA colonization surveillance program. It is not intended to diagnose MRSA infection nor to guide or monitor treatment for MRSA infections.      Studies:  Recent x-ray studies have been reviewed in detail by the Attending Physician  Scheduled Meds:  Scheduled Meds: . ALPRAZolam  0.25 mg Oral BID  . dexamethasone  4 mg Oral 4 times per day  . insulin aspart  0-15 Units Subcutaneous 6 times per day  . insulin glargine  10 Units Subcutaneous Q24H  . lactose free nutrition  237 mL Oral Q24H  . levETIRAcetam  750 mg Oral BID  . levothyroxine  25 mcg Oral QAC breakfast  . lisinopril  5 mg Oral QPM  . LORazepam  2 mg Intravenous Once  . metoprolol succinate  12.5 mg Oral QPM  . nicotine  21 mg Transdermal Daily  . pantoprazole  40 mg Oral Daily  .  sertraline  150 mg Oral QPM  . sodium chloride  3 mL Intravenous Q12H    Time spent on care of this patient: 40 mins   Marjorie Deprey, Geraldo Docker , MD  [facility] Office  [phone] Pager - [phone]  On-Call/Text Page:      [email or web address]      password [email or web address]  If 7PM-7AM, please contact night-coverage [email or web address] Password [email or web address] 07/26/2015, 5:51 PM   LOS: 2 days   Care during the described time interval was provided by me .  I have reviewed this patient's available data, including medical history, events of note, physical examination, and all test results as part of my evaluation. I have personally reviewed and interpreted all radiology studies.   Dia Crawford, MD [phone] Pager

## 2015-07-27 LAB — GLUCOSE, CAPILLARY
GLUCOSE-CAPILLARY: 163 mg/dL — AB (ref 65–99)
GLUCOSE-CAPILLARY: 192 mg/dL — AB (ref 65–99)
GLUCOSE-CAPILLARY: 192 mg/dL — AB (ref 65–99)
GLUCOSE-CAPILLARY: 227 mg/dL — AB (ref 65–99)

## 2015-07-27 MED ORDER — DEXAMETHASONE 4 MG PO TABS: 4.0000 mg | ORAL_TABLET | Freq: Four times a day (QID) | ORAL | Status: DC

## 2015-07-27 MED ORDER — BOOST PLUS PO LIQD: 237.0000 mL | ORAL | Status: DC

## 2015-07-27 MED ORDER — NICOTINE 21 MG/24HR TD PT24: 21.0000 mg | MEDICATED_PATCH | Freq: Every day | TRANSDERMAL | Status: DC

## 2015-07-27 MED ORDER — INSULIN PEN NEEDLE 29G X 12MM MISC: 20.0000 [IU] | Freq: Every day | Status: DC

## 2015-07-27 MED ORDER — INSULIN GLARGINE 100 UNIT/ML ~~LOC~~ SOLN
20.0000 [IU] | SUBCUTANEOUS | Status: DC
  Filled 2015-07-27: qty 0.2

## 2015-07-27 MED ORDER — PANTOPRAZOLE SODIUM 40 MG PO TBEC: 40.0000 mg | DELAYED_RELEASE_TABLET | Freq: Every day | ORAL | Status: DC

## 2015-07-27 MED ORDER — INSULIN GLARGINE 100 UNIT/ML SOLOSTAR PEN: 20.0000 [IU] | PEN_INJECTOR | Freq: Every day | SUBCUTANEOUS | Status: DC

## 2015-07-27 MED ORDER — BLOOD GLUCOSE METER KIT: PACK | Status: DC

## 2015-07-27 MED ORDER — BLOOD GLUC METER DISP-STRIPS DEVI: 1.0000 | Freq: Three times a day (TID) | Status: DC

## 2015-07-27 MED ORDER — LEVETIRACETAM 750 MG PO TABS: 750.0000 mg | ORAL_TABLET | Freq: Two times a day (BID) | ORAL | Status: DC

## 2015-07-27 MED ORDER — SERTRALINE HCL 50 MG PO TABS: 150.0000 mg | ORAL_TABLET | Freq: Every evening | ORAL | Status: DC

## 2015-07-27 NOTE — Discharge Summary (Addendum)
Physician Discharge Summary  Michelle Duran DQQ:[id number] DOB: 16-Oct-1955 DOA: 07/24/2015  PCP: Dr Jeremy Johann [facility]).  Admit date: 07/24/2015 Discharge date: 07/27/2015  Time spent: 25 minutes  Recommendations for Outpatient Follow-up:  Brain mass:  -Not clear whether patient has primary brain tumor or metastasized tumor. Patient is symptomatic.  -MRI of her brain showed mass effect upon the right lateral ventricle with midline shift to the left by 7.3 mm. -Neurosurgery, Dr. Cyndy Freeze recommended to get further image tests to explore primary tumors. -Continue Keppra 750 mg BID -Continue Decadron 4 mg QID -Surgery scheduled for 11/29 with Dr Cyndy Freeze  Hypothyroidism:  -Continue home Synthroid 25 g daily -Check TSH WNL  Essential hypertension: -Metoprolol XL 12.5 mg daily -lisinopril 5 mg daily  GERD: -Protonix 40 mg daily  Depression and Anxiety:  -Stable, no suicidal or homicidal ideations. - Xanax 0.25 mg BID (home dose) -Increase Zoloft 150 mg daily; given patient's current situation.  Tobacco abuse: -Did counseling about importance of quitting smoking -Nicotine patch  Diabetes iatrogenic -Patient hyperglycemic secondary to steroids. -discharge on Lantus 20 units daily -Scheduled follow-up for 1 week Dr Jeremy Johann ; Patient with new onset neurologic symptoms C/W CVA however discovered secondary to brain mass C/W glioma. Diabetes iatrogenic, depression and anxiety    Discharge Diagnoses:  Principal Problem:   Brain mass   Active Problems:   Hypothyroidism   Panic disorder   Essential hypertension   Mitral valve disorder   GERD   Facial droop   Slurred speech  Hypothyroidism   Discharge Condition:fair  Diet recommendation: regular  Filed Weights   07/24/15 1521 07/24/15 2310  Weight: 86.24 kg (190 lb 2 oz) 85.2 kg (187 lb 13.3 oz)     Hospital Course:  59 y.o.WF PMHx Panic DO, Tobacco Abuse, Rt Adrenal Gland Nodule, MVP, Fibromyalgia,  Vitamin D Deficiency, HTN, Diet-controlled DM Type 2, GERD, hypothyroidism.   Presents with right facial droop, slurred speech and difficulty swallowing.  Patient reports that she was noticed by her mother to have right facial droop, slurred speech in the past 3 days. She also has difficulty swallowing, and headache over occipital area. Patient states that she lost 20 pounds over one month recently. She does not have unilateral weakness, numbness or tingliness inextremities. She has left hand shaking when she smokes. She does not have chest pain, cough, fever, chills, abdominal pain, diarrhea, symptoms of UTI. She has chronic lower back and right foot pain which have been going on for long time, and was told to have arthritis.  In ED, patient was found to have WBC 11.8, INR 0.99, PTT 30, temperature normal, slightly bradycardia, electrolyte and renal function okay. Patient is admitted to inpatient for further evaluation and treatment. Neurosurgeon, Dr. Cyndy Freeze was consulted to the ED, will see pt in the morning. Upon admission patient was diagnosed with Right frontal lobe 4 x 4.7 x 3.7 cm mass with significant surrounding vasogenic edema. Anterior to mid right temporal lobe 4.4 x 2.6 x 3.9 cm mass with significant surrounding vasogenic edema. Mass effect upon the right lateral ventricle with midline shift to the left by 7.3 mm. Early trapping of the left temporal horn not excluded. Compression of the right midbrain/ cerebral peduncle with mild right uncal herniation. The vasogenic edema surrounding the enhancing lesions is confluent through the subinsular/external capsule region. No confluence of the enhancing component. Question primary multi focal glioblastoma versus metastatic disease. During his hospitalization patient was treated with Decadron for vasogenic edema secondary to  brain masses. Neurologic symptoms resolved. Unfortunately caused iatrogenic diabetes which will be treated with Lantus until  Neurosurgery DC Decadron.        Consultants: Dr.Benjamin Jilda Panda Ditty (neurosurgery)    Procedure/Significant Events: 11/21 MRI brain W/WO contrast;Right frontal lobe 4 x 4.7 x 3.7 cm mass with significant surrounding vasogenic edema. -Anterior to mid right temporal lobe 4.4 x 2.6 x 3.9 cm mass with vasogenic edema. -Mass effect upon the right lateral ventricle;midline shift to left by 7.3 mm. Early trapping of the left temporal horn not excluded. -Compression of the right midbrain/ cerebral peduncle with mild right uncal herniation. 11/22 CT chest W/contrast;mediastinum is unremarkable in appearance. No mediastinal lymphadenopathy  11/22 CT abdomen and pelvis;1.3 cm right adrenal nodule is stable from 2011 and likely benign.   Discharge Exam: Filed Vitals:   07/27/15 0700 07/27/15 1146  BP:  135/71  Pulse:  53  Temp: 98.4 F (36.9 C) 98.4 F (36.9 C)  Resp:  12    General: A/O 4, NAD, No acute respiratory distress Eyes: Negative headache, eye pain, double vision,negative scleral hemorrhage ENT: Negative Runny nose, negative ear pain, negative gingival bleeding, Neck: Negative scars, masses, torticollis, lymphadenopathy, JVD Lungs: Clear to auscultation bilaterally without wheezes or crackles Cardiovascular: Regular rate and rhythm without murmur gallop or rub normal S1 and S2 Abdomen:negative abdominal pain, nondistended, positive soft, bowel sounds, no rebound, no ascites, no appreciable mass Extremities: No significant cyanosis, clubbing, or edema bilateral lower extremities Psychiatric: Negative depression, negative anxiety, negative fatigue, negative mania, positive anxiety  Neurologic: Cranial nerves II through XII intact, tongue/uvula midline, all extremities muscle strength 5/5, sensation intact throughout, negative dysarthria, negative expressive aphasia, negative receptive aphasia.     Discharge Instructions    Current Discharge Medication List     START taking these medications   Details  Blood Gluc Meter Disp-Strips (BLOOD GLUCOSE METER DISPOSABLE) DEVI 1 strip by Does not apply route 4 (four) times daily -  before meals and at bedtime. Qty: 300 each, Refills: 0    blood glucose meter kit and supplies Dispense based on patient and insurance preference. Use up to four times daily as directed. (FOR ICD-9 250.00, 250.01). Qty: 1 each, Refills: 0    dexamethasone (DECADRON) 4 MG tablet Take 1 tablet (4 mg total) by mouth 4 (four) times daily. Qty: 28 tablet, Refills: 0    Insulin Glargine (LANTUS) 100 UNIT/ML Solostar Pen Inject 20 Units into the skin daily at 10 pm. Qty: 15 mL, Refills: 11    Insulin Pen Needle (AURORA PEN NEEDLES) 29G X MISC 20 Units by Does not apply route at bedtime. Qty: 100 each, Refills: 0    lactose free nutrition (BOOST PLUS) LIQD Take 237 mLs by mouth daily. Qty: 30 Can, Refills: 0    levETIRAcetam (KEPPRA) 750 MG tablet Take 1 tablet (750 mg total) by mouth 2 (two) times daily. Qty: 30 tablet, Refills: 0    nicotine (NICODERM CQ - DOSED IN MG/24 HOURS) 21 mg/24hr patch Place 1 patch (21 mg total) onto the skin daily. Qty: 28 patch, Refills: 0    pantoprazole (PROTONIX) 40 MG tablet Take 1 tablet (40 mg total) by mouth daily. Switch for any other PPI at similar dose and frequency Qty: 10 tablet, Refills: 0      CONTINUE these medications which have CHANGED   Details  sertraline (ZOLOFT) 50 MG tablet Take 3 tablets (150 mg total) by mouth every evening. Qty: 90 tablet, Refills: 0  CONTINUE these medications which have NOT CHANGED   Details  acetaminophen (TYLENOL) 650 MG CR tablet Take 1,300 mg by mouth every 8 (eight) hours as needed for pain.    ALPRAZolam (XANAX) 0.5 MG tablet Take 0.25 mg by mouth 2 (two) times daily.    levothyroxine (SYNTHROID, LEVOTHROID) 50 MCG tablet Take 25 mcg by mouth daily.    lisinopril (PRINIVIL,ZESTRIL) 5 MG tablet Take 5 mg by mouth every evening.     metoprolol succinate (TOPROL-XL) 25 MG 24 hr tablet Take 12.5 mg by mouth every evening.    omeprazole (PRILOSEC) 20 MG capsule Take 40 mg by mouth daily.        Allergies  Allergen Reactions  . Codeine     REACTION: vomiting  . Prednisone     Panic attacks  . Sulfonamide Derivatives     REACTION: nausea   Follow-up Information    Follow up with Tamala Fothergill, MD On 08/01/2015.   Specialty:  Neurosurgery   Why:  for surgery   Contact information:   [place] [address] [phone]       Follow up with Reginia Naas, MD. Schedule an appointment as soon as possible for a visit in 1 week.   Specialty:  Family Medicine   Why:  Scheduled follow-up for 1 week; Patient with new onset neurologic symptoms C/W CVA however discovered secondary to brain mass C/W glioma. Diabetes iatrogenic, depression and anxiety   Contact information:   [place] [address] [phone]        The results of significant diagnostics from this hospitalization (including imaging, microbiology, ancillary and laboratory) are listed below for reference.    Significant Diagnostic Studies: Ct Chest W Contrast  07/25/2015  CLINICAL DATA:  Recently diagnosed brain masses. Assess for primary malignancy. Initial encounter. EXAM: CT CHEST, ABDOMEN, AND PELVIS WITH CONTRAST TECHNIQUE: Multidetector CT imaging of the chest, abdomen and pelvis was performed following the standard protocol during bolus administration of intravenous contrast. CONTRAST:  12mL OMNIPAQUE IOHEXOL 300 MG/ML  SOLN COMPARISON:  CT of the abdomen and pelvis performed 02/02/2010, and chest radiograph performed 11/25/2010 FINDINGS: CT CHEST FINDINGS Minimal bibasilar atelectasis is noted. The lungs are otherwise clear. There is no evidence of focal consolidation, pleural effusion or pneumothorax. No masses are seen. The mediastinum is unremarkable in appearance. No  mediastinal lymphadenopathy is seen. No pericardial effusions identified. The great vessels are grossly unremarkable in appearance. The thyroid gland is grossly unremarkable. No axillary lymphadenopathy is appreciated. No acute osseous abnormalities are identified. CT ABDOMEN PELVIS FINDINGS The liver and spleen are unremarkable in appearance. The gallbladder is within normal limits. The pancreas and left adrenal gland are unremarkable. A 1.3 cm right adrenal nodule is stable from 2011 and likely benign. The kidneys are unremarkable in appearance. There is no evidence of hydronephrosis. No renal or ureteral stones are seen. No perinephric stranding is appreciated. No free fluid is identified. The small bowel is unremarkable in appearance. The stomach is within normal limits. No acute vascular abnormalities are seen. The appendix is normal in caliber and contains air, without evidence for appendicitis. The colon is partially filled with contrast, and is unremarkable in appearance. The bladder is mildly distended and grossly unremarkable. The uterus is grossly unremarkable in appearance. The ovaries are relatively symmetric. No suspicious adnexal masses are seen. No inguinal lymphadenopathy is seen. No acute osseous abnormalities are identified. Facet disease is noted at the  lower lumbar spine. IMPRESSION: 1. No acute abnormality seen within the chest, abdomen or pelvis. No evidence for primary malignancy. 2. 1.3 cm right adrenal nodule is stable from 2011 and likely benign. 3. Minimal bibasilar atelectasis noted.  Lungs otherwise clear. Electronically Signed   By: Garald Balding M.D.   On: 07/25/2015 02:33   Mr Jeri Cos EH Contrast  07/26/2015  CLINICAL DATA:  Initial evaluation for brain tumor. Stereotactic protocol for surgical planning. EXAM: MRI HEAD WITHOUT AND WITH CONTRAST TECHNIQUE: Multiplanar, multiecho pulse sequences of the brain and surrounding structures were obtained without and with intravenous  contrast. CONTRAST:  38mL MULTIHANCE GADOBENATE DIMEGLUMINE 529 MG/ML IV SOLN COMPARISON:  Previous MRI from 07/24/2015. FINDINGS: Heterogeneous Lee enhancing right frontal lobe mass again seen, measures 4.7 x 3.6 x 3.3 cm. Probable necrosis with possible hemorrhagic blood products again seen. Surrounding vasogenic edema is similar. Heterogeneous Lee enhancing anterior mid right temporal lobe mass again seen, measures 4.1 x 3.1 x 2.2 cm. Probable necrosis with hemorrhagic blood products again seen. Significant surrounding vasogenic edema. Associated mass effect on the right lateral ventricle which is partially effaced. There is 7 mm of right-to-left shift. Compression of the right midbrain/cerebral peduncle with mild right uncal herniation. Possible early trapping of the temporal horn of the right lateral ventricle not excluded, stable. Vasogenic edema surrounding these 2 masses is confluent through the subinsular/external capsule region. Enhancing component appears separate. Small blush of contrast enhancement within the parasagittal anterior right frontal lobe noted (series 11, image 14), stable from previous. No associated vasogenic edema within this region. This may be vascular in nature. No other mass lesion. No infarct. Major intracranial vascular flow voids are maintained. Mild scatter white matter changes present. Craniocervical junction normal. Pituitary gland normal. No acute abnormality about the orbits. Paranasal sinuses are clear. Scattered opacity within the mastoid air cells, left greater than right. Inner ear structures normal. Decreased signal intensity within the visualized bone marrow, which may be related to habitus or anemia. Scalp soft tissues demonstrate no acute abnormality. IMPRESSION: 1. Stable right frontal lobe and anterior mid right temporal lobe masses. 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 Surrounding vasogenic edema with  associated mass effect and 7 mm of right-to-left shift is stable as well. This study will be use for intraoperative guidance purposes. 2. No new intracranial process. Electronically Signed   By: Jeannine Boga M.D.   On: 07/26/2015 00:27   Mr Jeri Cos OZ Contrast  07/24/2015  CLINICAL DATA:  59 year old diabetic female presenting with facial droop and slurred speech with onset 3 days ago. Initial encounter. EXAM: MRI HEAD WITHOUT AND WITH CONTRAST TECHNIQUE: Multiplanar, multiecho pulse sequences of the brain and surrounding structures were obtained without and with intravenous contrast. CONTRAST:  44mL MULTIHANCE GADOBENATE DIMEGLUMINE 529 MG/ML IV SOLN COMPARISON:  06/23/2006 head CT.  No comparison brain MR. FINDINGS: Right frontal lobe 4 x 4.7 x 3.7 cm irregular necrotic possibly partially hemorrhagic mass with significant surrounding vasogenic edema. Anterior to mid right temporal lobe 4.4 x 2.6 x 3.9 cm irregular enhancing possibly necrotic/ hemorrhagic mass with significant surrounding vasogenic edema. Mass effect upon the right lateral ventricle with midline shift to the left by 7.3 mm. Early trapping of the left temporal horn not excluded. Compression of the right midbrain/ cerebral peduncle is with mild right uncal herniation. The vasogenic edema surrounding the enhancing lesions is confluent through the subinsular/external capsule region. No confluence of the enhancing component. It  is possible this represents presence of metastatic disease although primary multi focal glioma blastoma is also consideration. No MR evidence to suggest infection. No acute thrombotic infarct. Partial opacification left mastoid air cells without obstructing lesion noted. Minimal mucosal thickening ethmoid sinus air cells. Major intracranial vascular structures are patent. Decreased signal intensity of bone marrow may be related to patient's habitus. Correlation with CBC to exclude anemia contributing to this  appearance may be considered IMPRESSION: Right frontal lobe 4 x 4.7 x 3.7 cm mass with significant surrounding vasogenic edema. Anterior to mid right temporal lobe 4.4 x 2.6 x 3.9 cm mass with significant surrounding vasogenic edema. Mass effect upon the right lateral ventricle with midline shift to the left by 7.3 mm. Early trapping of the left temporal horn not excluded. Compression of the right midbrain/ cerebral peduncle with mild right uncal herniation. The vasogenic edema surrounding the enhancing lesions is confluent through the subinsular/external capsule region. No confluence of the enhancing component. Question primary multi focal glioblastoma versus metastatic disease. These results were called by telephone at the time of interpretation on 07/24/2015 at 7:40 pm to Dr. Nathaniel Man , who verbally acknowledged these results. Electronically Signed   By: Genia Del M.D.   On: 07/24/2015 19:55   Ct Abdomen Pelvis W Contrast  07/25/2015  CLINICAL DATA:  Recently diagnosed brain masses. Assess for primary malignancy. Initial encounter. EXAM: CT CHEST, ABDOMEN, AND PELVIS WITH CONTRAST TECHNIQUE: Multidetector CT imaging of the chest, abdomen and pelvis was performed following the standard protocol during bolus administration of intravenous contrast. CONTRAST:  132mL OMNIPAQUE IOHEXOL 300 MG/ML  SOLN COMPARISON:  CT of the abdomen and pelvis performed 02/02/2010, and chest radiograph performed 11/25/2010 FINDINGS: CT CHEST FINDINGS Minimal bibasilar atelectasis is noted. The lungs are otherwise clear. There is no evidence of focal consolidation, pleural effusion or pneumothorax. No masses are seen. The mediastinum is unremarkable in appearance. No mediastinal lymphadenopathy is seen. No pericardial effusions identified. The great vessels are grossly unremarkable in appearance. The thyroid gland is grossly unremarkable. No axillary lymphadenopathy is appreciated. No acute osseous abnormalities are  identified. CT ABDOMEN PELVIS FINDINGS The liver and spleen are unremarkable in appearance. The gallbladder is within normal limits. The pancreas and left adrenal gland are unremarkable. A 1.3 cm right adrenal nodule is stable from 2011 and likely benign. The kidneys are unremarkable in appearance. There is no evidence of hydronephrosis. No renal or ureteral stones are seen. No perinephric stranding is appreciated. No free fluid is identified. The small bowel is unremarkable in appearance. The stomach is within normal limits. No acute vascular abnormalities are seen. The appendix is normal in caliber and contains air, without evidence for appendicitis. The colon is partially filled with contrast, and is unremarkable in appearance. The bladder is mildly distended and grossly unremarkable. The uterus is grossly unremarkable in appearance. The ovaries are relatively symmetric. No suspicious adnexal masses are seen. No inguinal lymphadenopathy is seen. No acute osseous abnormalities are identified. Facet disease is noted at the lower lumbar spine. IMPRESSION: 1. No acute abnormality seen within the chest, abdomen or pelvis. No evidence for primary malignancy. 2. 1.3 cm right adrenal nodule is stable from 2011 and likely benign. 3. Minimal bibasilar atelectasis noted.  Lungs otherwise clear. Electronically Signed   By: Garald Balding M.D.   On: 07/25/2015 02:33    Microbiology: Recent Results (from the past 240 hour(s))  MRSA PCR Screening     Status: None   Collection Time: 07/24/15 11:30  PM  Result Value Ref Range Status   MRSA by PCR NEGATIVE NEGATIVE Final    Comment:        The GeneXpert MRSA Assay (FDA approved for NASAL specimens only), is one component of a comprehensive MRSA colonization surveillance program. It is not intended to diagnose MRSA infection nor to guide or monitor treatment for MRSA infections.      Labs: Basic Metabolic Panel:  Recent Labs Lab 07/24/15 1530  07/24/15 1659 07/25/15 0418  NA 139 141 137  K 4.0 4.0 3.9  CL 106 104 105  CO2 25  --  25  GLUCOSE 142* 130* 230*  BUN $Re'12 13 9  'sec$ CREATININE 0.94 0.90 0.83  CALCIUM 9.1  --  9.2   Liver Function Tests:  Recent Labs Lab 07/24/15 1530  AST 21  ALT 22  ALKPHOS 112  BILITOT 0.2*  PROT 6.5  ALBUMIN 3.7   No results for input(s): LIPASE, AMYLASE in the last 168 hours. No results for input(s): AMMONIA in the last 168 hours. CBC:  Recent Labs Lab 07/24/15 1530 07/24/15 1659 07/25/15 0418  WBC 11.8*  --  9.9  NEUTROABS 6.8  --   --   HGB 14.3 15.0 14.6  HCT 42.2 44.0 42.9  MCV 92.1  --  91.5  PLT 372  --  342   Cardiac Enzymes: No results for input(s): CKTOTAL, CKMB, CKMBINDEX, TROPONINI in the last 168 hours. BNP: BNP (last 3 results) No results for input(s): BNP in the last 8760 hours.  ProBNP (last 3 results) No results for input(s): PROBNP in the last 8760 hours.  CBG:  Recent Labs Lab 07/26/15 2046 07/27/15 0047 07/27/15 0513 07/27/15 0821 07/27/15 1144  GLUCAP 192* 227* 192* 192* 163*       Signed:  S[facility] Illingworth J  [facility] 07/27/2015, 12:38 PM

## 2015-07-31 ENCOUNTER — Inpatient Hospital Stay (HOSPITAL_COMMUNITY): Admit: 2015-07-31 | Payer: BC Managed Care – PPO | Admitting: Neurological Surgery

## 2015-07-31 ENCOUNTER — Encounter (HOSPITAL_COMMUNITY): Payer: Self-pay

## 2015-07-31 SURGERY — CRANIOTOMY TUMOR EXCISION
Anesthesia: General | Laterality: Right

## 2015-08-01 ENCOUNTER — Other Ambulatory Visit: Payer: Self-pay | Admitting: Neurological Surgery

## 2015-08-07 ENCOUNTER — Encounter (HOSPITAL_COMMUNITY)
Admission: RE | Admit: 2015-08-07 | Discharge: 2015-08-07 | Disposition: A | Discharge status: Home / Self Care | Payer: BC Managed Care – PPO | Source: Ambulatory Visit | Attending: Neurological Surgery | Admitting: Neurological Surgery

## 2015-08-07 ENCOUNTER — Encounter (HOSPITAL_COMMUNITY): Payer: Self-pay

## 2015-08-07 HISTORY — DX: Ventricular premature depolarization: I49.3

## 2015-08-07 HISTORY — DX: Nocturia: R35.1

## 2015-08-07 HISTORY — DX: Panic disorder (episodic paroxysmal anxiety): F41.0

## 2015-08-07 HISTORY — DX: Other complications of anesthesia, initial encounter: T88.59XA

## 2015-08-07 HISTORY — DX: Anesthesia of skin: R20.0

## 2015-08-07 HISTORY — DX: Gastro-esophageal reflux disease without esophagitis: K21.9

## 2015-08-07 HISTORY — DX: Neoplasm of unspecified behavior of brain: D49.6

## 2015-08-07 HISTORY — DX: Paresthesia of skin: R20.2

## 2015-08-07 HISTORY — DX: Unspecified osteoarthritis, unspecified site: M19.90

## 2015-08-07 HISTORY — DX: Presence of spectacles and contact lenses: Z97.3

## 2015-08-07 HISTORY — DX: Personal history of other diseases of the digestive system: Z87.19

## 2015-08-07 HISTORY — DX: Dizziness and giddiness: R42

## 2015-08-07 HISTORY — DX: Adverse effect of unspecified anesthetic, initial encounter: T41.45XA

## 2015-08-07 HISTORY — DX: Family history of other specified conditions: Z84.89

## 2015-08-07 LAB — CBC
HEMATOCRIT: 44 % (ref 36.0–46.0)
Hemoglobin: 15.1 g/dL — ABNORMAL HIGH (ref 12.0–15.0)
MCH: 30.9 pg (ref 26.0–34.0)
MCHC: 34.3 g/dL (ref 30.0–36.0)
MCV: 90 fL (ref 78.0–100.0)
PLATELETS: 338 10*3/uL (ref 150–400)
RBC: 4.89 MIL/uL (ref 3.87–5.11)
RDW: 12.4 % (ref 11.5–15.5)
WBC: 10.8 10*3/uL — AB (ref 4.0–10.5)

## 2015-08-07 LAB — BASIC METABOLIC PANEL
ANION GAP: 5 (ref 5–15)
BUN: 31 mg/dL — ABNORMAL HIGH (ref 6–20)
CHLORIDE: 101 mmol/L (ref 101–111)
CO2: 28 mmol/L (ref 22–32)
Calcium: 9 mg/dL (ref 8.9–10.3)
Creatinine, Ser: 0.95 mg/dL (ref 0.44–1.00)
GFR calc Af Amer: >60 mL/min (ref >60)
GLUCOSE: 239 mg/dL — AB (ref 65–99)
POTASSIUM: 4.5 mmol/L (ref 3.5–5.1)
Sodium: 134 mmol/L — ABNORMAL LOW (ref 135–145)

## 2015-08-07 LAB — GLUCOSE, CAPILLARY: Glucose-Capillary: 173 mg/dL — ABNORMAL HIGH (ref 65–99)

## 2015-08-07 NOTE — Progress Notes (Signed)
Pt denies SOB and chest pain but is under the care of Dr. Shelva Majestic, cardiology . Pt denies having a cardiac cath but stated that an echo and stress test was done " years ago, not sure exactly when." Records were requested from Dr. Claiborne Billings. Unable to instruct pt to take decadron on morning of surgery because pt stated that she takes her Decadron with food as instructed.  Pt no longer takes Prilosec, takes Protonix instead.

## 2015-08-07 NOTE — Pre-Procedure Instructions (Signed)
Michelle Duran  08/07/2015      [id number] [place], [place] - [address] [place] [address] Phone: [phone] Fax: [phone]  [place], [place] [address] Phone: [phone] Fax: [phone]    Your procedure is scheduled on Wednesday, August 09, 2015  Report to [facility] Admitting at 6:30 A.M.  Call this number if you have problems the morning of surgery:  [phone]   Remember:  Do not eat food or drink liquids after midnight Tuesday, August 08, 2015  Take these medicines the morning of surgery with A SIP OF WATER : ALPRAZolam Duanne Moron),  levETIRAcetam (KEPPRA), levothyroxine (SYNTHROID, LEVOTHROID), pantoprazole (PROTONIX), if needed: acetaminophen (TYLENOL) for pain  Stop taking Aspirin, vitamins, fish oil and herbal medications. Do not take any NSAIDs ie: Ibuprofen, Advil, Naproxen or any medication containing Aspirin; stop now.   How to Manage Your Diabetes Before Surgery Why is it important to control my blood sugar before and after surgery?   Improving blood sugar levels before and after surgery helps healing and can limit problems.  A way of improving blood sugar control is eating a healthy diet by:  - Eating less sugar and carbohydrates  - Increasing activity/exercise  - Talk with your doctor about reaching your blood sugar goals  High blood sugars (greater than 180 mg/dL) can raise your risk of infections and slow down your recovery so you will need to focus on controlling your diabetes during the weeks before surgery.  Make sure that the doctor who takes care of your diabetes knows about your planned surgery including the date and location.  How do I manage my blood sugars before surgery?   Check your blood sugar at least 4 times a day, 2 days before surgery to make sure that they are not too high or low.   Check your blood  sugar the morning of your surgery when you wake up and every 2 hours until you get to the Short-Stay unit.  If your blood sugar is less than 70 mg/dL, you will need to treat for low blood sugar by:  Treat a low blood sugar (less than 70 mg/dL) with 1/2 cup of clear juice (cranberry or apple), 4 glucose tablets, OR glucose gel.  Recheck blood sugar in 15 minutes after treatment (to make sure it is greater than 70 mg/dL).  If blood sugar is not greater than 70 mg/dL on re-check, call [phone] for further instructions.   Report your blood sugar to the Short-Stay nurse when you get to Short-Stay.  References:  [facility], 2007 "How to Manage your Diabetes Before and After Surgery".  What do I do about my diabetes medications?    THE NIGHT BEFORE SURGERY, take 16 units of Insulin Glargine (LANTUS)  Insulin.  Do not wear jewelry, make-up or nail polish.  Do not wear lotions, powders, or perfumes.  You may not wear deodorant.  Do not shave 48 hours prior to surgery.    Do not bring valuables to the hospital.  [facility] is not responsible for any belongings or valuables.  Contacts, dentures or bridgework may not be worn into surgery.  Leave your suitcase in the car.  After surgery it may be brought to your room.  For patients admitted to the hospital, discharge time will be determined by your treatment team.  Patients discharged  the day of surgery will not be allowed to drive home.   Name and phone number of your driver: N/A Special instructions: Shower the nigh before surgery and the morning of surgery with CHG.  Please read over the following fact sheets that you were given. Pain Booklet, Coughing and Deep Breathing and Surgical Site Infection Prevention

## 2015-08-07 NOTE — Progress Notes (Signed)
Pt chart forwarded to [place], [place], anesthesia, to review cardiac history and and elevated BS (239).

## 2015-08-08 ENCOUNTER — Encounter (HOSPITAL_COMMUNITY): Payer: Self-pay

## 2015-08-08 LAB — HEMOGLOBIN A1C
Hgb A1c MFr Bld: 7.6 % — ABNORMAL HIGH (ref 4.8–5.6)
Mean Plasma Glucose: 171 mg/dL

## 2015-08-08 MED ORDER — CEFAZOLIN SODIUM-DEXTROSE 2-3 GM-% IV SOLR
2.0000 g | INTRAVENOUS | Status: DC
  Filled 2015-08-08: qty 50

## 2015-08-08 NOTE — Progress Notes (Signed)
Anesthesia Chart Review: Patient is a 59 year old female scheduled for right frontotemporal craniotomy for tumor resection with stereotactic navigation on 08/09/15 by Dr. Cyndy Freeze. Case was initially scheduled for 07/21/15 but was rescheduled for unclear reasons. She was admitted to [facility] 07/24/15 with three day history of right facial droop, slurred speech, occipital headaches, dysphagia. Work-up revealed a right frontal lobe and anterior to mid right temporal lobe mass, both with surrounding vasogenic edema and mass effect. Question primary multi focal glioblastoma versus metastatic disease. She was started on Decadron and Keppra. She had a normal swallow evaluation.  Other history includes recent former smoker (quit 07/24/15), hypothyroidism, DM2, HTN, MVP, fibromyalgia, PVC's (saw Dr. Claiborne Billings > 3 years ago), GERD, panic attacks, esophageal dilation, hiatal hernia, vertigo, right adrenal nodule 07/2015, BMI is consistent with obesity. PCP is Dr. Carol Ada.   Meds include Xanax, dexamethasone, Lantus, Keppra, levothyroxine, lisinopril, Toprol XL, Nicoderm patch, Prilosec, Protonix, Zoloft.  07/24/15 EKG: SR, low voltage precordial leads. No PVC's noted. Prior cardiac testing > 3 years ago. Reported history of MVP but no CAD/MI/CHF.  07/25/15 CT chest/abd/pelvis: IMPRESSION: 1. No acute abnormality seen within the chest, abdomen or pelvis. No evidence for primary malignancy. 2. 1.3 cm right adrenal nodule is stable from 2011 and likely benign. 3. Minimal bibasilar atelectasis noted. Lungs otherwise clear.  Preoperative labs noted. Glucose 239.   If no acute changes then I anticipate that she can proceed as planned.  [facility] Short Stay Center/Anesthesiology Phone [phone] 08/08/2015 9:53 AM

## 2015-08-09 ENCOUNTER — Inpatient Hospital Stay (HOSPITAL_COMMUNITY): Payer: BC Managed Care – PPO | Admitting: Certified Registered Nurse Anesthetist

## 2015-08-09 ENCOUNTER — Inpatient Hospital Stay (HOSPITAL_COMMUNITY)
Admission: RE | Admit: 2015-08-09 | Discharge: 2015-08-11 | DRG: 027 | Disposition: A | Discharge status: Home / Self Care | Payer: BC Managed Care – PPO | Source: Ambulatory Visit | Attending: Neurological Surgery | Admitting: Neurological Surgery

## 2015-08-09 ENCOUNTER — Encounter (HOSPITAL_COMMUNITY)
Admission: RE | Disposition: A | Discharge status: Home / Self Care | Payer: Self-pay | Source: Ambulatory Visit | Attending: Neurological Surgery

## 2015-08-09 ENCOUNTER — Encounter (HOSPITAL_COMMUNITY): Payer: Self-pay | Admitting: Certified Registered Nurse Anesthetist

## 2015-08-09 ENCOUNTER — Inpatient Hospital Stay (HOSPITAL_COMMUNITY): Payer: BC Managed Care – PPO | Admitting: Vascular Surgery

## 2015-08-09 DIAGNOSIS — D496 Neoplasm of unspecified behavior of brain: Secondary | ICD-10-CM | POA: Diagnosis present

## 2015-08-09 DIAGNOSIS — C711 Malignant neoplasm of frontal lobe: Principal | ICD-10-CM | POA: Diagnosis present

## 2015-08-09 DIAGNOSIS — C712 Malignant neoplasm of temporal lobe: Secondary | ICD-10-CM | POA: Diagnosis present

## 2015-08-09 DIAGNOSIS — G939 Disorder of brain, unspecified: Secondary | ICD-10-CM | POA: Diagnosis present

## 2015-08-09 HISTORY — PX: CRANIOTOMY: SHX93

## 2015-08-09 LAB — PREPARE RBC (CROSSMATCH)

## 2015-08-09 LAB — GLUCOSE, CAPILLARY
GLUCOSE-CAPILLARY: 202 mg/dL — AB (ref 65–99)
Glucose-Capillary: 164 mg/dL — ABNORMAL HIGH (ref 65–99)
Glucose-Capillary: 157 mg/dL — ABNORMAL HIGH (ref 65–99)
Glucose-Capillary: 183 mg/dL — ABNORMAL HIGH (ref 65–99)
Glucose-Capillary: 170 mg/dL — ABNORMAL HIGH (ref 65–99)

## 2015-08-09 LAB — MRSA PCR SCREENING: MRSA by PCR: NEGATIVE

## 2015-08-09 SURGERY — CRANIOTOMY TUMOR EXCISION
Anesthesia: General | Laterality: Right

## 2015-08-09 MED ORDER — MANNITOL 25 % IV SOLN
INTRAVENOUS | Status: DC | PRN
  Administered 2015-08-09: 25 g via INTRAVENOUS

## 2015-08-09 MED ORDER — HYDROMORPHONE HCL 1 MG/ML IJ SOLN
0.5000 mg | INTRAMUSCULAR | Status: DC | PRN
Start: 2015-08-09 — End: 2015-08-11
  Administered 2015-08-09 – 2015-08-10 (×3): 1 mg via INTRAVENOUS
  Filled 2015-08-09 (×3): qty 1

## 2015-08-09 MED ORDER — INSULIN ASPART 100 UNIT/ML ~~LOC~~ SOLN
0.0000 [IU] | Freq: Three times a day (TID) | SUBCUTANEOUS | Status: DC
  Administered 2015-08-10 (×2): 3 [IU] via SUBCUTANEOUS
  Administered 2015-08-10 – 2015-08-11 (×2): 5 [IU] via SUBCUTANEOUS

## 2015-08-09 MED ORDER — INSULIN PEN NEEDLE 29G X 12MM MISC: 20.0000 [IU] | Freq: Every day | Status: DC

## 2015-08-09 MED ORDER — GELATIN ABSORBABLE MT POWD
OROMUCOSAL | Status: DC | PRN
  Administered 2015-08-09: 11:00:00 via TOPICAL

## 2015-08-09 MED ORDER — NEOSTIGMINE METHYLSULFATE 10 MG/10ML IV SOLN
INTRAVENOUS | Status: AC
  Filled 2015-08-09: qty 2

## 2015-08-09 MED ORDER — DEXAMETHASONE SODIUM PHOSPHATE 10 MG/ML IJ SOLN
INTRAMUSCULAR | Status: AC
  Filled 2015-08-09: qty 2

## 2015-08-09 MED ORDER — DEXAMETHASONE SODIUM PHOSPHATE 10 MG/ML IJ SOLN
INTRAMUSCULAR | Status: AC
  Filled 2015-08-09: qty 1

## 2015-08-09 MED ORDER — SUCCINYLCHOLINE CHLORIDE 20 MG/ML IJ SOLN
INTRAMUSCULAR | Status: AC
  Filled 2015-08-09: qty 1

## 2015-08-09 MED ORDER — FUROSEMIDE 10 MG/ML IJ SOLN
INTRAMUSCULAR | Status: AC
  Filled 2015-08-09: qty 4

## 2015-08-09 MED ORDER — NEOSTIGMINE METHYLSULFATE 10 MG/10ML IV SOLN
INTRAVENOUS | Status: DC | PRN
  Administered 2015-08-09: 5 mg via INTRAVENOUS

## 2015-08-09 MED ORDER — 0.9 % SODIUM CHLORIDE (POUR BTL) OPTIME
TOPICAL | Status: DC | PRN
  Administered 2015-08-09 (×3): 1000 mL

## 2015-08-09 MED ORDER — DOCUSATE SODIUM 100 MG PO CAPS
100.0000 mg | ORAL_CAPSULE | Freq: Two times a day (BID) | ORAL | Status: DC
  Administered 2015-08-09 – 2015-08-11 (×4): 100 mg via ORAL
  Filled 2015-08-09 (×4): qty 1

## 2015-08-09 MED ORDER — LIDOCAINE HCL (CARDIAC) 20 MG/ML IV SOLN
INTRAVENOUS | Status: DC | PRN
  Administered 2015-08-09: 20 mg via INTRAVENOUS
  Administered 2015-08-09: 40 mg via INTRAVENOUS

## 2015-08-09 MED ORDER — ARTIFICIAL TEARS OP OINT
TOPICAL_OINTMENT | OPHTHALMIC | Status: AC
  Filled 2015-08-09: qty 10.5

## 2015-08-09 MED ORDER — SODIUM CHLORIDE 0.9 % IJ SOLN
INTRAMUSCULAR | Status: AC
  Filled 2015-08-09: qty 40

## 2015-08-09 MED ORDER — INSULIN GLARGINE 100 UNIT/ML ~~LOC~~ SOLN
20.0000 [IU] | Freq: Every day | SUBCUTANEOUS | Status: DC
  Administered 2015-08-09 – 2015-08-10 (×2): 20 [IU] via SUBCUTANEOUS
  Filled 2015-08-09 (×3): qty 0.2

## 2015-08-09 MED ORDER — BISACODYL 10 MG RE SUPP: 10.0000 mg | Freq: Every day | RECTAL | Status: DC | PRN

## 2015-08-09 MED ORDER — CEFAZOLIN SODIUM-DEXTROSE 2-3 GM-% IV SOLR
2.0000 g | INTRAVENOUS | Status: AC
  Administered 2015-08-09 (×2): 2 g via INTRAVENOUS

## 2015-08-09 MED ORDER — SENNA 8.6 MG PO TABS
1.0000 | ORAL_TABLET | Freq: Two times a day (BID) | ORAL | Status: DC
Start: 2015-08-09 — End: 2015-08-11
  Administered 2015-08-09 – 2015-08-11 (×5): 8.6 mg via ORAL
  Filled 2015-08-09 (×5): qty 1

## 2015-08-09 MED ORDER — NICOTINE 21 MG/24HR TD PT24
21.0000 mg | MEDICATED_PATCH | Freq: Every day | TRANSDERMAL | Status: DC
  Filled 2015-08-09 (×2): qty 1

## 2015-08-09 MED ORDER — FENTANYL CITRATE (PF) 250 MCG/5ML IJ SOLN
INTRAMUSCULAR | Status: AC
  Filled 2015-08-09: qty 5

## 2015-08-09 MED ORDER — PANTOPRAZOLE SODIUM 40 MG PO TBEC: 40.0000 mg | DELAYED_RELEASE_TABLET | Freq: Every day | ORAL | Status: DC

## 2015-08-09 MED ORDER — DEXAMETHASONE 4 MG PO TABS
4.0000 mg | ORAL_TABLET | Freq: Four times a day (QID) | ORAL | Status: DC
  Administered 2015-08-09 – 2015-08-11 (×9): 4 mg via ORAL
  Filled 2015-08-09 (×8): qty 1

## 2015-08-09 MED ORDER — ROCURONIUM BROMIDE 50 MG/5ML IV SOLN
INTRAVENOUS | Status: AC
  Filled 2015-08-09: qty 4

## 2015-08-09 MED ORDER — SODIUM CHLORIDE 0.9 % IV SOLN
INTRAVENOUS | Status: DC | PRN
  Administered 2015-08-09 (×2): via INTRAVENOUS

## 2015-08-09 MED ORDER — GLYCOPYRROLATE 0.2 MG/ML IJ SOLN
INTRAMUSCULAR | Status: DC | PRN
  Administered 2015-08-09: .2 mg via INTRAVENOUS
  Administered 2015-08-09: .6 mg via INTRAVENOUS

## 2015-08-09 MED ORDER — SODIUM CHLORIDE 0.9 % IV SOLN: 10.0000 mL/h | Freq: Once | INTRAVENOUS | Status: DC

## 2015-08-09 MED ORDER — DEXAMETHASONE SODIUM PHOSPHATE 10 MG/ML IJ SOLN
INTRAMUSCULAR | Status: DC | PRN
  Administered 2015-08-09: 10 mg via INTRAVENOUS

## 2015-08-09 MED ORDER — BOOST PLUS PO LIQD
237.0000 mL | ORAL | Status: DC
  Filled 2015-08-09 (×6): qty 237

## 2015-08-09 MED ORDER — CEFAZOLIN SODIUM 1-5 GM-% IV SOLN
1.0000 g | Freq: Three times a day (TID) | INTRAVENOUS | Status: AC
  Administered 2015-08-09 – 2015-08-10 (×2): 1 g via INTRAVENOUS
  Filled 2015-08-09 (×2): qty 50

## 2015-08-09 MED ORDER — ONDANSETRON HCL 4 MG/2ML IJ SOLN
INTRAMUSCULAR | Status: AC
  Filled 2015-08-09: qty 4

## 2015-08-09 MED ORDER — INSULIN GLARGINE 100 UNIT/ML SOLOSTAR PEN: 20.0000 [IU] | PEN_INJECTOR | Freq: Every day | SUBCUTANEOUS | Status: DC

## 2015-08-09 MED ORDER — ONDANSETRON HCL 4 MG/2ML IJ SOLN
INTRAMUSCULAR | Status: DC | PRN
  Administered 2015-08-09 (×2): 4 mg via INTRAVENOUS

## 2015-08-09 MED ORDER — LEVETIRACETAM 750 MG PO TABS
750.0000 mg | ORAL_TABLET | Freq: Two times a day (BID) | ORAL | Status: DC
  Administered 2015-08-09 – 2015-08-11 (×4): 750 mg via ORAL
  Filled 2015-08-09 (×4): qty 1

## 2015-08-09 MED ORDER — FLEET ENEMA 7-19 GM/118ML RE ENEM: 1.0000 | ENEMA | Freq: Once | RECTAL | Status: DC | PRN

## 2015-08-09 MED ORDER — PHENYLEPHRINE 40 MCG/ML (10ML) SYRINGE FOR IV PUSH (FOR BLOOD PRESSURE SUPPORT)
PREFILLED_SYRINGE | INTRAVENOUS | Status: AC
  Filled 2015-08-09: qty 30

## 2015-08-09 MED ORDER — ARTIFICIAL TEARS OP OINT
TOPICAL_OINTMENT | OPHTHALMIC | Status: DC | PRN
  Administered 2015-08-09: 1 via OPHTHALMIC

## 2015-08-09 MED ORDER — LIDOCAINE-EPINEPHRINE 2 %-1:100000 IJ SOLN
30.0000 mL | INTRAMUSCULAR | Status: AC
  Filled 2015-08-09: qty 30

## 2015-08-09 MED ORDER — CEFAZOLIN SODIUM-DEXTROSE 2-3 GM-% IV SOLR
INTRAVENOUS | Status: AC
  Filled 2015-08-09: qty 50

## 2015-08-09 MED ORDER — HEMOSTATIC AGENTS (NO CHARGE) OPTIME
TOPICAL | Status: DC | PRN
  Administered 2015-08-09 (×2): 1 via TOPICAL

## 2015-08-09 MED ORDER — BUPIVACAINE-EPINEPHRINE (PF) 0.5% -1:200000 IJ SOLN
INTRAMUSCULAR | Status: DC | PRN
  Administered 2015-08-09: 30 mL via PERINEURAL

## 2015-08-09 MED ORDER — ROCURONIUM BROMIDE 50 MG/5ML IV SOLN
INTRAVENOUS | Status: AC
  Filled 2015-08-09: qty 1

## 2015-08-09 MED ORDER — LIDOCAINE HCL (CARDIAC) 20 MG/ML IV SOLN
INTRAVENOUS | Status: AC
  Filled 2015-08-09: qty 15

## 2015-08-09 MED ORDER — REMIFENTANIL HCL 1 MG IV SOLR: INTRAVENOUS | Status: DC | PRN

## 2015-08-09 MED ORDER — GLYCOPYRROLATE 0.2 MG/ML IJ SOLN
INTRAMUSCULAR | Status: AC
  Filled 2015-08-09: qty 3

## 2015-08-09 MED ORDER — LIDOCAINE-EPINEPHRINE 2 %-1:100000 IJ SOLN
INTRAMUSCULAR | Status: DC | PRN
  Administered 2015-08-09: 30 mL via INTRADERMAL

## 2015-08-09 MED ORDER — NITROGLYCERIN 0.2 MG/ML ON CALL CATH LAB
INTRAVENOUS | Status: DC | PRN
  Administered 2015-08-09 (×2): 80 ug via INTRAVENOUS
  Administered 2015-08-09: 20 ug via INTRAVENOUS
  Administered 2015-08-09: 40 ug via INTRAVENOUS

## 2015-08-09 MED ORDER — SODIUM CHLORIDE 0.9 % IV SOLN
1000.0000 mg | Freq: Once | INTRAVENOUS | Status: AC
  Administered 2015-08-09: 1000 mg via INTRAVENOUS
  Filled 2015-08-09 (×2): qty 10

## 2015-08-09 MED ORDER — SODIUM CHLORIDE 0.9 % IV SOLN
INTRAVENOUS | Status: DC | PRN
  Administered 2015-08-09: 08:00:00 via INTRAVENOUS

## 2015-08-09 MED ORDER — HYDROCODONE-ACETAMINOPHEN 5-325 MG PO TABS
1.0000 | ORAL_TABLET | ORAL | Status: DC | PRN
  Administered 2015-08-10 – 2015-08-11 (×4): 1 via ORAL
  Filled 2015-08-09 (×5): qty 1

## 2015-08-09 MED ORDER — THROMBIN 20000 UNITS EX SOLR
CUTANEOUS | Status: DC | PRN
  Administered 2015-08-09: 11:00:00 via TOPICAL

## 2015-08-09 MED ORDER — LISINOPRIL 5 MG PO TABS
5.0000 mg | ORAL_TABLET | Freq: Every evening | ORAL | Status: DC
  Administered 2015-08-09 – 2015-08-11 (×3): 5 mg via ORAL
  Filled 2015-08-09 (×3): qty 1

## 2015-08-09 MED ORDER — FUROSEMIDE 10 MG/ML IJ SOLN
INTRAMUSCULAR | Status: DC | PRN
  Administered 2015-08-09: 10 mg via INTRAMUSCULAR

## 2015-08-09 MED ORDER — PROMETHAZINE HCL 25 MG PO TABS
12.5000 mg | ORAL_TABLET | ORAL | Status: DC | PRN
Start: 2015-08-09 — End: 2015-08-11

## 2015-08-09 MED ORDER — FENTANYL CITRATE (PF) 100 MCG/2ML IJ SOLN
INTRAMUSCULAR | Status: DC | PRN
  Administered 2015-08-09: 100 ug via INTRAVENOUS
  Administered 2015-08-09: 150 ug via INTRAVENOUS
  Administered 2015-08-09: 25 ug via INTRAVENOUS

## 2015-08-09 MED ORDER — PHENYLEPHRINE HCL 10 MG/ML IJ SOLN
INTRAMUSCULAR | Status: DC | PRN
  Administered 2015-08-09: 20 ug via INTRAVENOUS
  Administered 2015-08-09: 40 ug via INTRAVENOUS

## 2015-08-09 MED ORDER — ACETAMINOPHEN 650 MG RE SUPP: 650.0000 mg | RECTAL | Status: DC | PRN

## 2015-08-09 MED ORDER — BLOOD GLUC METER DISP-STRIPS DEVI: 1.0000 | Freq: Three times a day (TID) | Status: DC

## 2015-08-09 MED ORDER — PROPOFOL 10 MG/ML IV BOLUS
INTRAVENOUS | Status: DC | PRN
  Administered 2015-08-09 (×2): 100 mg via INTRAVENOUS
  Administered 2015-08-09: 50 mg via INTRAVENOUS
  Administered 2015-08-09: 150 mg via INTRAVENOUS

## 2015-08-09 MED ORDER — PROPOFOL 10 MG/ML IV BOLUS
INTRAVENOUS | Status: AC
  Filled 2015-08-09: qty 40

## 2015-08-09 MED ORDER — LEVOTHYROXINE SODIUM 25 MCG PO TABS
25.0000 ug | ORAL_TABLET | Freq: Every day | ORAL | Status: DC
  Administered 2015-08-10 – 2015-08-11 (×2): 25 ug via ORAL
  Filled 2015-08-09 (×2): qty 1

## 2015-08-09 MED ORDER — SODIUM CHLORIDE 0.9 % IV SOLN
0.0125 ug/kg/min | INTRAVENOUS | Status: AC
  Administered 2015-08-09: 10:00:00 via INTRAVENOUS
  Administered 2015-08-09: .5 ug/kg/min via INTRAVENOUS
  Administered 2015-08-09: 11:00:00 via INTRAVENOUS
  Filled 2015-08-09: qty 2000

## 2015-08-09 MED ORDER — ROCURONIUM BROMIDE 100 MG/10ML IV SOLN
INTRAVENOUS | Status: DC | PRN
  Administered 2015-08-09: 10 mg via INTRAVENOUS
  Administered 2015-08-09: 30 mg via INTRAVENOUS
  Administered 2015-08-09 (×2): 10 mg via INTRAVENOUS
  Administered 2015-08-09: 50 mg via INTRAVENOUS

## 2015-08-09 MED ORDER — ONDANSETRON HCL 4 MG PO TABS: 4.0000 mg | ORAL_TABLET | ORAL | Status: DC | PRN

## 2015-08-09 MED ORDER — ONDANSETRON HCL 4 MG/2ML IJ SOLN
4.0000 mg | INTRAMUSCULAR | Status: DC | PRN
  Administered 2015-08-09: 4 mg via INTRAVENOUS
  Filled 2015-08-09: qty 2

## 2015-08-09 MED ORDER — LABETALOL HCL 5 MG/ML IV SOLN
10.0000 mg | INTRAVENOUS | Status: DC | PRN
Start: 2015-08-09 — End: 2015-08-11

## 2015-08-09 MED ORDER — DEXTROSE 5 % IV SOLN
10.0000 mg | INTRAVENOUS | Status: DC | PRN
  Administered 2015-08-09: 25 ug/min via INTRAVENOUS

## 2015-08-09 MED ORDER — MIDAZOLAM HCL 2 MG/2ML IJ SOLN
INTRAMUSCULAR | Status: AC
  Filled 2015-08-09: qty 2

## 2015-08-09 MED ORDER — SODIUM CHLORIDE 0.9 % IR SOLN
Status: DC | PRN
  Administered 2015-08-09: 10:00:00

## 2015-08-09 MED ORDER — METOPROLOL SUCCINATE ER 25 MG PO TB24
12.5000 mg | ORAL_TABLET | Freq: Every evening | ORAL | Status: DC
  Administered 2015-08-09 – 2015-08-11 (×3): 12.5 mg via ORAL
  Filled 2015-08-09 (×3): qty 1

## 2015-08-09 MED ORDER — ACETAMINOPHEN 325 MG PO TABS
650.0000 mg | ORAL_TABLET | ORAL | Status: DC | PRN
  Administered 2015-08-10 – 2015-08-11 (×2): 650 mg via ORAL
  Filled 2015-08-09 (×2): qty 2

## 2015-08-09 MED ORDER — ALPRAZOLAM 0.25 MG PO TABS
0.2500 mg | ORAL_TABLET | Freq: Two times a day (BID) | ORAL | Status: DC
  Administered 2015-08-09 – 2015-08-11 (×4): 0.25 mg via ORAL
  Filled 2015-08-09 (×5): qty 1

## 2015-08-09 MED ORDER — PANTOPRAZOLE SODIUM 40 MG PO TBEC
40.0000 mg | DELAYED_RELEASE_TABLET | Freq: Every day | ORAL | Status: DC
  Administered 2015-08-09 – 2015-08-11 (×3): 40 mg via ORAL
  Filled 2015-08-09 (×3): qty 1

## 2015-08-09 MED ORDER — SERTRALINE HCL 50 MG PO TABS
150.0000 mg | ORAL_TABLET | Freq: Every evening | ORAL | Status: DC
  Administered 2015-08-09 – 2015-08-11 (×3): 150 mg via ORAL
  Filled 2015-08-09: qty 1
  Filled 2015-08-09: qty 3
  Filled 2015-08-09: qty 1

## 2015-08-09 MED ORDER — SODIUM CHLORIDE 0.9 % IV SOLN
INTRAVENOUS | Status: DC
  Administered 2015-08-09 (×2): via INTRAVENOUS

## 2015-08-09 MED ORDER — EPHEDRINE SULFATE 50 MG/ML IJ SOLN
INTRAMUSCULAR | Status: DC | PRN
  Administered 2015-08-09: 5 mg via INTRAVENOUS

## 2015-08-09 SURGICAL SUPPLY — 98 items
APL SKNCLS STERI-STRIP NONHPOA (GAUZE/BANDAGES/DRESSINGS)
APPLICATOR CHLORAPREP 3ML ORNG (MISCELLANEOUS) ×6 IMPLANT
BANDAGE GAUZE 4  KLING STR (GAUZE/BANDAGES/DRESSINGS) ×3 IMPLANT
BENZOIN TINCTURE PRP APPL 2/3 (GAUZE/BANDAGES/DRESSINGS) IMPLANT
BLADE 11 SAFETY STRL DISP (BLADE) ×3 IMPLANT
BLADE CLIPPER SURG (BLADE) ×3 IMPLANT
BLADE SURG 11 STRL SS (BLADE) ×3 IMPLANT
BLADE ULTRA TIP 2M (BLADE) IMPLANT
BNDG GAUZE ELAST 4 BULKY (GAUZE/BANDAGES/DRESSINGS) ×6 IMPLANT
BRUSH SCRUB EZ 1% IODOPHOR (MISCELLANEOUS) ×3 IMPLANT
BUR ACORN 6.0 PRECISION (BURR) ×2 IMPLANT
BUR ACORN 6.0MM PRECISION (BURR) ×1
BUR ADDG 1.1 (BURR) IMPLANT
BUR ADDG 1.1MM (BURR)
BUR MATCHSTICK NEURO 3.0 LAGG (BURR) IMPLANT
BUR SPIRAL ROUTER 2.3 (BUR) ×2 IMPLANT
BUR SPIRAL ROUTER 2.3MM (BUR) ×1
CANISTER SUCT 3000ML PPV (MISCELLANEOUS) ×3 IMPLANT
CATH ROBINSON RED A/P 14FR (CATHETERS) IMPLANT
CLIP TI MEDIUM 6 (CLIP) IMPLANT
CONT SPEC 4OZ CLIKSEAL STRL BL (MISCELLANEOUS) ×3 IMPLANT
DRAIN SNY WOU 7FLT (WOUND CARE) IMPLANT
DRAPE MICROSCOPE LEICA (MISCELLANEOUS) ×3 IMPLANT
DRAPE NEUROLOGICAL W/INCISE (DRAPES) ×3 IMPLANT
DRAPE PROXIMA HALF (DRAPES) ×3 IMPLANT
DRAPE SHEET LG 3/4 BI-LAMINATE (DRAPES) ×6 IMPLANT
DRAPE SURG 17X23 STRL (DRAPES) IMPLANT
DRAPE WARM FLUID 44X44 (DRAPE) ×3 IMPLANT
DRSG MEPILEX BORDER 4X12 (GAUZE/BANDAGES/DRESSINGS) ×3 IMPLANT
DURAMATRIX ONLAY 2X2 (Neuro Prosthesis/Implant) ×3 IMPLANT
ELECT CAUTERY BLADE 6.4 (BLADE) ×3 IMPLANT
ELECT REM PT RETURN 9FT ADLT (ELECTROSURGICAL) ×3
ELECTRODE REM PT RTRN 9FT ADLT (ELECTROSURGICAL) ×1 IMPLANT
EVACUATOR 1/8 PVC DRAIN (DRAIN) IMPLANT
EVACUATOR SILICONE 100CC (DRAIN) IMPLANT
FORCEPS BIPOLAR SPETZLER 8 1.0 (NEUROSURGERY SUPPLIES) ×3 IMPLANT
GAUZE SPONGE 4X4 12PLY STRL (GAUZE/BANDAGES/DRESSINGS) ×3 IMPLANT
GAUZE SPONGE 4X4 16PLY XRAY LF (GAUZE/BANDAGES/DRESSINGS) IMPLANT
GLOVE BIOGEL PI IND STRL 7.5 (GLOVE) ×1 IMPLANT
GLOVE BIOGEL PI INDICATOR 7.5 (GLOVE) ×2
GLOVE EXAM NITRILE LRG STRL (GLOVE) ×3 IMPLANT
GLOVE EXAM NITRILE MD LF STRL (GLOVE) IMPLANT
GLOVE EXAM NITRILE XL STR (GLOVE) IMPLANT
GLOVE EXAM NITRILE XS STR PU (GLOVE) IMPLANT
GLOVE SS BIOGEL STRL SZ 7 (GLOVE) ×2 IMPLANT
GLOVE SUPERSENSE BIOGEL SZ 7 (GLOVE) ×4
GOWN STRL REUS W/ TWL LRG LVL3 (GOWN DISPOSABLE) ×1 IMPLANT
GOWN STRL REUS W/ TWL XL LVL3 (GOWN DISPOSABLE) IMPLANT
GOWN STRL REUS W/TWL 2XL LVL3 (GOWN DISPOSABLE) IMPLANT
GOWN STRL REUS W/TWL LRG LVL3 (GOWN DISPOSABLE) ×3
GOWN STRL REUS W/TWL XL LVL3 (GOWN DISPOSABLE)
HEMOSTAT POWDER KIT SURGIFOAM (HEMOSTASIS) ×3 IMPLANT
HEMOSTAT POWDER SURGIFOAM 1G (HEMOSTASIS) ×3 IMPLANT
HEMOSTAT SURGICEL 2X14 (HEMOSTASIS) IMPLANT
KIT BASIN OR (CUSTOM PROCEDURE TRAY) ×3 IMPLANT
KIT ROOM TURNOVER OR (KITS) ×3 IMPLANT
MARKER SKIN DUAL TIP RULER LAB (MISCELLANEOUS) ×3 IMPLANT
NEEDLE HYPO 25X1 1.5 SAFETY (NEEDLE) ×3 IMPLANT
NS IRRIG 1000ML POUR BTL (IV SOLUTION) ×6 IMPLANT
PACK CRANIOTOMY (CUSTOM PROCEDURE TRAY) ×3 IMPLANT
PATTIES SURGICAL .5 X.5 (GAUZE/BANDAGES/DRESSINGS) IMPLANT
PATTIES SURGICAL .5 X3 (DISPOSABLE) IMPLANT
PATTIES SURGICAL 1X1 (DISPOSABLE) IMPLANT
PENCIL BUTTON HOLSTER BLD 10FT (ELECTRODE) ×3 IMPLANT
PIN MAYFIELD SKULL DISP (PIN) ×3 IMPLANT
PLATE 1.5  2HOLE LNG NEURO (Plate) ×4 IMPLANT
PLATE 1.5 2HOLE LNG NEURO (Plate) ×2 IMPLANT
PLATE 1.5/0.5 18.5MM BURR HOLE (Plate) ×3 IMPLANT
RUBBERBAND STERILE (MISCELLANEOUS) ×9 IMPLANT
SCREW SELF DRILL HT 1.5/4MM (Screw) ×21 IMPLANT
SCRUB FOAM SURGICAL 12 800ML (MISCELLANEOUS) ×3 IMPLANT
SPONGE NEURO XRAY DETECT 1X3 (DISPOSABLE) IMPLANT
SPONGE SURGIFOAM ABS GEL 100 (HEMOSTASIS) ×3 IMPLANT
SPONGE SURGIFOAM ABS GEL 100C (HEMOSTASIS) ×6 IMPLANT
STAPLER VISISTAT 35W (STAPLE) ×3 IMPLANT
STOCKINETTE 6  STRL (DRAPES) ×2
STOCKINETTE 6 STRL (DRAPES) ×1 IMPLANT
STRIP SURGICAL 1 X 6 IN (GAUZE/BANDAGES/DRESSINGS) ×3 IMPLANT
STRIP SURGICAL 1/2 X 6 IN (GAUZE/BANDAGES/DRESSINGS) ×3 IMPLANT
STRIP SURGICAL 1/4 X 6 IN (GAUZE/BANDAGES/DRESSINGS) ×3 IMPLANT
STRIP SURGICAL 3/4 X 6 IN (GAUZE/BANDAGES/DRESSINGS) ×3 IMPLANT
SUT ETHILON 3 0 FSL (SUTURE) IMPLANT
SUT ETHILON 3 0 PS 1 (SUTURE) IMPLANT
SUT MNCRL AB 3-0 PS2 18 (SUTURE) ×6 IMPLANT
SUT NURALON 4 0 TR CR/8 (SUTURE) ×9 IMPLANT
SUT VIC AB 0 CT1 18XCR BRD8 (SUTURE) ×2 IMPLANT
SUT VIC AB 0 CT1 8-18 (SUTURE) ×6
SUT VIC AB 2-0 CT1 18 (SUTURE) ×12 IMPLANT
SUT VIC AB 3-0 SH 8-18 (SUTURE) ×6 IMPLANT
SYR 30ML LL (SYRINGE) ×3 IMPLANT
TOWEL OR 17X24 6PK STRL BLUE (TOWEL DISPOSABLE) ×6 IMPLANT
TOWEL OR 17X26 10 PK STRL BLUE (TOWEL DISPOSABLE) ×3 IMPLANT
TRAY FOLEY W/METER SILVER 14FR (SET/KITS/TRAYS/PACK) ×3 IMPLANT
TUBE CONNECTING 12'X1/4 (SUCTIONS) ×1
TUBE CONNECTING 12X1/4 (SUCTIONS) ×2 IMPLANT
UNDERPAD 30X30 INCONTINENT (UNDERPADS AND DIAPERS) IMPLANT
WATER STERILE IRR 1000ML POUR (IV SOLUTION) ×3 IMPLANT
ZIMMER MIXING BOWL ×3 IMPLANT

## 2015-08-09 NOTE — Addendum Note (Signed)
Addendum  created 08/09/15 1500 by Merdis Delay, CRNA   Modules edited: Anesthesia Events, Anesthesia Medication Administration, Narrator   Narrator:  Narrator: Event Log Edited

## 2015-08-09 NOTE — Op Note (Signed)
08/09/2015  1:21 PM  PATIENT:  Michelle Duran  59 y.o. female with right frontal and temporal enhancing masses  PRE-OPERATIVE DIAGNOSIS:  Frontal and temporal brain masses  POST-OPERATIVE DIAGNOSIS:  Same  PROCEDURE:  Stereotactic guided right frontotemporal craniotomy for resection of 2 brain masses  SURGEON:  Cyndy Freeze, MD  ASSISTANTS: Sherley Bounds, M.D.  ANESTHESIA:   General  DRAINS: None  SPECIMEN:  Right frontal brain mass, right temporal brain mass  INDICATION FOR PROCEDURE: Fariha Dohrman presented to the hospital several weeks ago after an episode of transient left hemiparesis with slurring of her speech. She was found to have right frontotemporal enhancing masses. I recommended craniotomy for resection of both of these. We had several detailed discussions regarding the risks and benefits of the procedure included but not limited to hemorrhage, infection, paralysis, failure to achieve stated goals, language dysfunction, and the imponderables. Patient understood the risks, benefits, and alternatives and potential outcomes and wished to proceed.  PROCEDURE DETAILS: After smooth induction of general endotracheal anesthesia the right frontotemporal area was clipped of hair. It was wiped down with alcohol. Mayfield pins were placed and the patient was fixated to the bed with the head turned to the left. Stereotactic navigation software was registered utilizing a high-resolution MRI study with contrast. Utilizing the navigation system a curvilinear scalp incision was planned to provide adequate exposure to both the right frontal and right temporal masses. The skin was then prepped and draped in the usual sterile fashion.  A mixture of lidocaine and Marcaine with epinephrine was injected along the planned incision. The skin was opened sharply. The temporalis was opened with monopolar cautery. A musculocutaneous flap was then elevated anteriorly. Bleeding was controlled with monopolar  cautery. The navigation system was again used to plan the craniotomy. Several bur holes were drilled and then a right frontal temporal craniotomy was turned. This was elevated with periosteal elevator. The dura was opened using Metzenbaum scissors and cutting along the posterior border and advancing it anteriorly.  Using the stereotactic navigation platform I determined the gyrus where the tumor came closest to the cortical surface. Using bipolar cautery by coagulated the surface of this gyrus and opened sharply. Using suction and bipolar cautery I dissected to the depths of the tumor. I debulked it from inside to out. Specimen was taken for both frozen and permanent section analysis. Intermittently checking with the stereotactic navigation system I proceeded to expand the limits of my debulking. I encountered what appeared to be edematous white matter on all the borders of the tumor. Stereotactic navigation confirmed that I had reached the outer rounds of the enhancing area. I placed Gelfoam within this tumor bed as well as a cottonoid and turned my attention to the temporal area.  I confirmed tumor location using stereotactic navigation software. I entered the tumor through the right middle temporal gyrus within 4-1/2 cm of the temporal tip. I quickly entered tumor and again debulked it from inside out. Specimen was again sent for permanent section analysis. I again intermittently checked the stereotactic navigation system as I proceeded to debulk internally. I encountered edematous white matter on all sides and confirmed that I had reached the extent of the enhancing area with stereotactic navigation. Hemostasis was obtained in the same way.  The operative microscope was brought into the field. Both tumor beds were inspected closely for residual tumor that was not apparent. Additional hemostasis was obtained. The tumor beds were packed with a single layer of Surgicel. I  irrigated copiously with bacitracin  saline.  The dura was closed with interrupted Nurolon sutures. 2 areas that did not close primarily were closed with dural substitute. The craniotomy flap was reinserted and secured with titanium plates and screws. I irrigated again. The temporalis and galea were closed with interrupted Vicryl sutures. The skin was closed with a running 3-0 Monocryl.  Drapes were removed and Mayfield pins were removed. The patient awoke from general anesthesia without complications. Sponge and needle counts were correct 2.  PATIENT DISPOSITION:  PACU then ICU   Delay start of Pharmacological VTE agent (>24hrs) due to surgical blood loss or risk of bleeding:  Yes

## 2015-08-09 NOTE — Transfer of Care (Signed)
Immediate Anesthesia Transfer of Care Note  Patient: Michelle Duran  Procedure(s) Performed: Procedure(s) with comments: Right Frontotemporal craniotomy for tumor resection with stereotactic navigation (Right) - Right Frontotemporal craniotomy for tumor resection with stereotactic navigation  Patient Location: PACU  Anesthesia Type:General  Level of Consciousness: awake, alert  and oriented  Airway & Oxygen Therapy: Patient Spontanous Breathing and Patient connected to nasal cannula oxygen  Post-op Assessment: Report given to RN and Post -op Vital signs reviewed and stable  Post vital signs: Reviewed and stable  Last Vitals:  Filed Vitals:   08/09/15 0712  BP: 132/65  Pulse: 45  Temp: 36.7 C  Resp: 18    Complications: No apparent anesthesia complications   BP [id number] systolic

## 2015-08-09 NOTE — Progress Notes (Signed)
Awake and alert Oriented to person and place Speech is fluent Good repeats Full strength bilaterally Doing well To ICU after recovery

## 2015-08-09 NOTE — Progress Notes (Signed)
No changes since pre-operative evaluation Awake, alert, oriented Speech fluent Moving all extremities well All questions answered Ready for OR

## 2015-08-09 NOTE — Anesthesia Postprocedure Evaluation (Signed)
Anesthesia Post Note  Patient: Michelle Duran  Procedure(s) Performed: Procedure(s) (LRB): Right Frontotemporal craniotomy for tumor resection with stereotactic navigation (Right)  Patient location during evaluation: PACU Anesthesia Type: General Level of consciousness: awake and alert Pain management: pain level controlled Vital Signs Assessment: post-procedure vital signs reviewed and stable Respiratory status: spontaneous breathing, nonlabored ventilation, respiratory function stable and patient connected to nasal cannula oxygen Cardiovascular status: blood pressure returned to baseline and stable Postop Assessment: no signs of nausea or vomiting Anesthetic complications: no    Last Vitals:  Filed Vitals:   08/09/15 1345 08/09/15 1400  BP: 142/69 123/76  Pulse: 56 63  Temp:  36.8 C  Resp: 17 16    Last Pain:  Filed Vitals:   08/09/15 1407  PainSc: 4                  Zenaida Deed

## 2015-08-09 NOTE — Anesthesia Preprocedure Evaluation (Addendum)
Anesthesia Evaluation  Patient identified by MRN, date of birth, ID band Patient awake    Reviewed: Allergy & Precautions, H&P , NPO status , Patient's Chart, lab work & pertinent test results  History of Anesthesia Complications Negative for: history of anesthetic complications  Airway Mallampati: II  TM Distance: >3 FB Neck ROM: full    Dental  (+) Missing, Poor Dentition, Dental Advisory Given   Pulmonary neg pulmonary ROS, former smoker,    Pulmonary exam normal breath sounds clear to auscultation       Cardiovascular hypertension, Normal cardiovascular exam+ dysrhythmias  Rhythm:regular Rate:Normal     Neuro/Psych PSYCHIATRIC DISORDERS Anxiety Depression  Neuromuscular disease    GI/Hepatic Neg liver ROS, hiatal hernia, GERD  ,  Endo/Other  diabetesHypothyroidism   Renal/GU negative Renal ROS     Musculoskeletal  (+) Arthritis , Fibromyalgia -  Abdominal   Peds  Hematology negative hematology ROS (+)   Anesthesia Other Findings   Reproductive/Obstetrics negative OB ROS                           Anesthesia Physical Anesthesia Plan  ASA: III  Anesthesia Plan: General   Post-op Pain Management:    Induction: Intravenous  Airway Management Planned: Oral ETT  Additional Equipment: Arterial line  Intra-op Plan:   Post-operative Plan: Extubation in OR  Informed Consent: I have reviewed the patients History and Physical, chart, labs and discussed the procedure including the risks, benefits and alternatives for the proposed anesthesia with the patient or authorized representative who has indicated his/her understanding and acceptance.   Dental Advisory Given  Plan Discussed with: Anesthesiologist, CRNA and Surgeon  Anesthesia Plan Comments:         Anesthesia Quick Evaluation

## 2015-08-09 NOTE — Anesthesia Procedure Notes (Signed)
Procedure Name: Intubation Date/Time: 08/09/2015 10:19 AM Performed by: Merdis Delay Pre-anesthesia Checklist: Patient identified, Timeout performed, Emergency Drugs available, Suction available and Patient being monitored Patient Re-evaluated:Patient Re-evaluated prior to inductionOxygen Delivery Method: Circle system utilized Preoxygenation: Pre-oxygenation with 100% oxygen Intubation Type: IV induction Ventilation: Mask ventilation without difficulty Laryngoscope Size: Mac and 3 Grade View: Grade I Tube type: Oral Tube size: 8.0 mm Number of attempts: 1 Airway Equipment and Method: Stylet Placement Confirmation: ETT inserted through vocal cords under direct vision,  breath sounds checked- equal and bilateral,  positive ETCO2 and CO2 detector Secured at: 23 cm Tube secured with: Tape Dental Injury: Teeth and Oropharynx as per pre-operative assessment

## 2015-08-10 ENCOUNTER — Encounter (HOSPITAL_COMMUNITY): Payer: Self-pay | Admitting: Neurological Surgery

## 2015-08-10 ENCOUNTER — Inpatient Hospital Stay (HOSPITAL_COMMUNITY): Payer: BC Managed Care – PPO

## 2015-08-10 LAB — BASIC METABOLIC PANEL
Anion gap: 7 (ref 5–15)
BUN: 17 mg/dL (ref 6–20)
CALCIUM: 8.6 mg/dL — AB (ref 8.9–10.3)
CO2: 26 mmol/L (ref 22–32)
Chloride: 102 mmol/L (ref 101–111)
Creatinine, Ser: 0.91 mg/dL (ref 0.44–1.00)
GFR calc Af Amer: >60 mL/min (ref >60)
GLUCOSE: 225 mg/dL — AB (ref 65–99)
Potassium: 4.6 mmol/L (ref 3.5–5.1)
Sodium: 135 mmol/L (ref 135–145)

## 2015-08-10 LAB — CBC
HCT: 40 % (ref 36.0–46.0)
Hemoglobin: 13.4 g/dL (ref 12.0–15.0)
MCH: 30.5 pg (ref 26.0–34.0)
MCHC: 33.5 g/dL (ref 30.0–36.0)
MCV: 90.9 fL (ref 78.0–100.0)
Platelets: 256 10*3/uL (ref 150–400)
RBC: 4.4 MIL/uL (ref 3.87–5.11)
RDW: 12.7 % (ref 11.5–15.5)
WBC: 19.9 10*3/uL — ABNORMAL HIGH (ref 4.0–10.5)

## 2015-08-10 LAB — GLUCOSE, CAPILLARY
GLUCOSE-CAPILLARY: 168 mg/dL — AB (ref 65–99)
GLUCOSE-CAPILLARY: 169 mg/dL — AB (ref 65–99)
GLUCOSE-CAPILLARY: 250 mg/dL — AB (ref 65–99)
Glucose-Capillary: 244 mg/dL — ABNORMAL HIGH (ref 65–99)

## 2015-08-10 LAB — POCT I-STAT 7, (LYTES, BLD GAS, ICA,H+H)
Bicarbonate: 23.8 mEq/L (ref 20.0–24.0)
CALCIUM ION: 1.09 mmol/L — AB (ref 1.12–1.23)
HEMATOCRIT: 35 % — AB (ref 36.0–46.0)
HEMOGLOBIN: 11.9 g/dL — AB (ref 12.0–15.0)
O2 Saturation: 100 %
PCO2 ART: 33.9 mmHg — AB (ref 35.0–45.0)
PO2 ART: 295 mmHg — AB (ref 80.0–100.0)
Patient temperature: 35.4
Potassium: 3.5 mmol/L (ref 3.5–5.1)
Sodium: 138 mmol/L (ref 135–145)
TCO2: 25 mmol/L (ref 0–100)
pH, Arterial: 7.448 (ref 7.350–7.450)

## 2015-08-10 MED ORDER — GADOBENATE DIMEGLUMINE 529 MG/ML IV SOLN
20.0000 mL | Freq: Once | INTRAVENOUS | Status: AC
  Administered 2015-08-10: 17 mL via INTRAVENOUS

## 2015-08-10 NOTE — Evaluation (Signed)
Clinical/Bedside Swallow Evaluation Patient Details  Name: Michelle Duran MRN: [id number] Date of Birth: [birth year]  Today's Date: 08/10/2015 Time: SLP Start Time (ACUTE ONLY): 46 SLP Stop Time (ACUTE ONLY): 1622 SLP Time Calculation (min) (ACUTE ONLY): 21 min  Past Medical History:  Past Medical History  Diagnosis Date  . Gallstone   . Hypertension   . Hypothyroidism   . Mitral valve prolapse   . Fibromyalgia   . Anxiety   . Vitamin D deficiency   . Diabetes mellitus without complication ([place])   . Tobacco abuse   . Mass of adrenal gland ([place])   . Wears glasses   . Complication of anesthesia     Pt was shaking after having epidural  during c-section  . PVC's (premature ventricular contractions)     saw cardiologist Dr. Claiborne Billings > 3 years ago with echo and stress (entered 08/08/2015)  . Vertigo   . Numbness and tingling     fingers and toes  . Arthritis   . GERD (gastroesophageal reflux disease)   . History of hiatal hernia   . Nocturia   . Family history of adverse reaction to anesthesia     mother had PONV  . Panic attacks   . Neoplasm of brain [facility])    Past Surgical History:  Past Surgical History  Procedure Laterality Date  . Cesarean section    . Multiple tooth extractions    . Esophagus stretched    . Craniotomy Right 08/09/2015    Procedure: Right Frontotemporal craniotomy for tumor resection with stereotactic navigation;  Surgeon: Kevan Ny Ditty, MD;  Location: [facility];  Service: Neurosurgery;  Laterality: Right;  Right Frontotemporal craniotomy for tumor resection with stereotactic navigation   HPI:  59 yo female s/p right frontotemporal craniotomy for tumor resection on 08/09/15. PMH includes DM2, GERD, esophageal dilation, hiatal hernia.    Assessment / Plan / Recommendation Clinical Impression  Pt describes mild difficulty swallowing solid foods, although she also says that this is close to her baseline due to her h/o esophageal dysphagia (GERD,  HH, s/p stretching). She is also hesitant to chew harder solid textures as it hurts her head. Despite the above, oropharyngeal swallow appears WFL, without overt signs of aspiration. Would favor continuing regular consistencies and thin liquids, which would allow pt to order foods that she feels comfortable managing. Will f/u briefly to ensure tolerance as pt is able to try more solid POs.    Aspiration Risk  Mild aspiration risk    Diet Recommendation  Regular textures, thin liquids   Medication Administration: Whole meds with liquid    Other  Recommendations Oral Care Recommendations: Oral care BID   Follow up Recommendations  None    Frequency and Duration min 1 x/week  1 week       Prognosis        Swallow Study   General Date of Onset: 07/24/15 HPI: 59 yo female s/p right frontotemporal craniotomy for tumor resection on 08/09/15. PMH includes DM2, GERD, esophageal dilation, hiatal hernia.  Type of Study: Bedside Swallow Evaluation Previous Swallow Assessment: BSE 07/25/15 recommending regular diet and thin liquids, small sips to reduce nasal regurgitation Diet Prior to this Study: Regular;Thin liquids Temperature Spikes Noted: No Respiratory Status: Room air History of Recent Intubation: Yes Length of Intubations (days):  (for procedure) Date extubated: 08/09/15 Behavior/Cognition: Alert;Cooperative;Pleasant mood Oral Cavity Assessment: Within Functional Limits Oral Care Completed by SLP: No Oral Cavity - Dentition: Missing dentition Vision: Functional for  self-feeding Self-Feeding Abilities: Able to feed self Patient Positioning: Upright in bed Baseline Vocal Quality: Other (comment) (pt subjectively says it is softer than usual) Volitional Cough: Strong (pt tries not to cough due to recent surgery) Volitional Swallow: Able to elicit    Oral/Motor/Sensory Function     Ice Chips Ice chips: Not tested   Thin Liquid Thin Liquid: Within functional limits Presentation:  Cup;Self Fed;Straw    Nectar Thick Nectar Thick Liquid: Not tested   Honey Thick Honey Thick Liquid: Not tested   Puree Puree: Within functional limits Presentation: Self Fed;Spoon   Solid Solid: Within functional limits Presentation: Self Fed      Germain Osgood, M.A. CCC-SLP [phone]  Germain Osgood 08/10/2015,4:39 PM

## 2015-08-10 NOTE — Progress Notes (Signed)
No acute events AVSS Awake, alert, oriented Speech fluent, good naming and repeats Moving all extremities well, no drift MRI shows some posterior and medial enhancement at the temporal resection cavity Doing remarkably well I think the residual in the temporal lobe was unavoidable without dramatically increasing risk of post-operative deficit Transfer to floor

## 2015-08-11 LAB — GLUCOSE, CAPILLARY
GLUCOSE-CAPILLARY: 203 mg/dL — AB (ref 65–99)
Glucose-Capillary: 117 mg/dL — ABNORMAL HIGH (ref 65–99)
Glucose-Capillary: 228 mg/dL — ABNORMAL HIGH (ref 65–99)

## 2015-08-11 MED ORDER — DEXAMETHASONE 4 MG PO TABS: 4.0000 mg | ORAL_TABLET | Freq: Two times a day (BID) | ORAL | Status: DC

## 2015-08-11 NOTE — Progress Notes (Signed)
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 Pt states that PTA, she had been having difficulty with her "thinking". States she felt "scattered and couldn't do the things I normally could do at work with my eyes closed". On assessment, pt demonstrates difficulty with visuospatial skills, attention, executive level skills and short term memory. Assessed using the [facility] Cognitive Assessment [facility]) and scored a 25 (below 26 considered impaired). Pt asked this therapist when she was going to be discharged, and did not remember having a conversation with the MD and PT earlier about plan to D/C today. Pt indicated that she was not comfortable with plan to D/C today. Nsg made aware. At this time, recommend following up with OT at the neuro outpt center to maximize functional level of independence with IADL tasks and facilitate safe return to work. Case manager contacted regarding recommendations. Acute OT signing off.     Follow Up Recommendations  Outpatient OT;Supervision/Assistance - 24 hour (neuro oupt)    Equipment Recommendations  None recommended by OT    Recommendations for Other Services       Precautions / Restrictions Precautions Precautions: Fall Precaution Comments: notes she has cats at home and sometimes "get underfoot"      Mobility Bed Mobility Overal bed mobility: Independent             General bed mobility comments: including linens, HOB flat, no rail  Transfers Overall transfer level: Mod I Equipment used: None             General transfer comment: slow with movements    Balance Overall balance assessment: Needs  assistance Sitting-balance support: No upper extremity supported;Feet supported Sitting balance-Leahy Scale: Good    Standing balance support: No upper extremity supported Standing balance-Leahy Scale: Fair                     ADL Overall ADL's : Needs assistance/impaired Eating/Feeding: Modified independent   Grooming: Set up   Upper Body Bathing: Set up;Sitting   Lower Body Bathing: Set up;Supervison/ safety;Sit to/from stand   Upper Body Dressing : Supervision/safety;Set up   Lower Body Dressing: Supervision/safety;Set up;Sit to/from stand   Toilet Transfer: Min guard   [place] and Hygiene: Supervision/safety;Sit to/from stand   Tub/ Shower Transfer: Min guard   Functional mobility during ADLs: Supervision/safety (appear unsteady at times. furniture walking) General ADL Comments: Pt discussed how she was feeling like she was having a hard time "keeping up with things" and howshe feels "scattered"/     Vision Additional Comments: Will further assess. Pt states she vision feels cloudy. Needs to be further assessed   Perception Perception Perception Tested?: Yes Comments: difficulty with trail making. missed 3/6 targets in L field with extinction testing. ? mild L inattention   Praxis Praxis Praxis tested?: Within functional limits    Pertinent Vitals/Pain Pain Assessment: 0-10 Pain Score: 3  Pain Location: head Pain Descriptors / Indicators: Aching Pain Intervention(s): Limited activity within patient's tolerance     Hand Dominance Right   Extremity/Trunk Assessment Upper Extremity Assessment Upper Extremity Assessment: Overall WFL for tasks assessed   Lower Extremity Assessment Lower Extremity Assessment:  Defer to PT evaluation   Cervical / Trunk Assessment Cervical / Trunk Assessment: Normal   Communication Communication Communication: No difficulties   Cognition Arousal/Alertness: Awake/alert Behavior During  Therapy: WFL for tasks assessed/performed Overall Cognitive Status: Impaired/Different from baseline Area of Impairment: Memory;Problem solving;Attention   Current Attention Level: Selective Memory: Decreased short-term memory       Problem Solving: Slow processing General Comments: Assessed with the [place] and received a score of 25/30 demonstrating difficulty with visuospatial skills and serial subtraction, attention and delayed recall. Pt described herself as having more difficulty with memory and feeling "scattered". States she was having more difficulty doing things at work that she did "everyday with her eyes closed".   General Comments       Exercises       Shoulder Instructions      Home Living Family/patient expects to be discharged to:: Private residence Living Arrangements: Children;Other relatives;Parent Available Help at Discharge: Family;Available 24 hours/day (son some; 52 yo mother some) Type of Home: House Home Access: Stairs to enter Technical brewer of Steps: 3 Entrance Stairs-Rails: None Home Layout: Two level;Bed/bath upstairs Alternate Level Stairs-Number of Steps: flight Alternate Level Stairs-Rails: Can reach both Bathroom Shower/Tub: Teacher, early years/pre: Handicapped height Bathroom Accessibility: Yes   Home Equipment: Bedside commode;Cane - single point (BSC over each toilet for her mother; )          Prior Functioning/Environment Level of Independence: Independent        Comments: no imbalance or falls; states she felt like "she was having a hard time concentrating and getting things done. I was coming undone and I couldn't do the things I could normally do at work"    OT Diagnosis: Cognitive deficits;Disturbance of vision;Acute pain   OT Problem List: Impaired vision/perception;Decreased cognition;Pain   OT Treatment/Interventions:      OT Goals(Current goals can be found in the care plan section) Acute Rehab OT  Goals Patient Stated Goal: to get better OT Goal Formulation: All assessment and education complete, DC therapy  OT Frequency:     Barriers to D/C:            Co-evaluation              End of Session Nurse Communication: Mobility status;Other (comment) (pt stating she does not feel comfortable with D/C today)  Activity Tolerance: Patient tolerated treatment well Patient left: in bed;with call bell/phone within reach;with family/visitor present   Time: [id number] OT Time Calculation (min): 30 min Charges:  OT General Charges $OT Visit: 1 Procedure OT Evaluation $Initial OT Evaluation Tier I: 1 Procedure OT Treatments $Self Care/Home Management : 8-22 mins G-Codes:    Kinston Magnan,HILLARY 08/13/15, 12:51 PM   [facility], OTR/L  [phone] 08/13/15

## 2015-08-11 NOTE — Discharge Summary (Signed)
Date of admission: 08/09/2015  Date of discharge: 08/11/2015  Admission diagnosis: Multiple enhancing brain masses  Discharge diagnosis: Same  Procedure performed: Stereotactic guided right frontotemporal craniotomy for resection of multiple brain masses  Attending: Aldean Ast, M.D.  Hospital course: Michelle Duran was admitted to the hospital morning surgery and taken to the operating room for the above listed operation. She tolerated this well. Postoperatively she was neurologically intact and has remained so. After 2 days of recovery she has remained medically and neurologically stable and is discharged home at this time.  Follow-up: With me in 1 week  Discharge medications: Resume prior medications, Percocet for pain, taper dexamethasone by taking 4 mg 3 times a day for the next 3 days and then 4 mg twice a day thereafter

## 2015-08-11 NOTE — Progress Notes (Signed)
Speech Language Pathology Treatment: Dysphagia  Patient Details Name: Michelle Duran MRN: [id number] DOB: [birth year] Today's Date: 08/11/2015 Time: [id number] SLP Time Calculation (min) (ACUTE ONLY): 21 min  Assessment / Plan / Recommendation Clinical Impression  Skilled observation of consumption of thin-mechanical soft consistencies without overt s/s of aspiration noted with minimal verbal cues provided during session; pt stated she will cough with larger sips of liquids, but this is only occasionally and was not observed during session with SLP.  Stated swallowing modifications if she notices this in future with removal of straw during consumption of liquids.  Pt also stated she has pain with mastication (causing headache), so she prefers soft foods at this time.  Downgrade to mech/soft may be considered if this doesn't resolve. ST will s/o at this time d/t pt consuming current diet safely/efficiently.     HPI HPI: 59 yo female s/p right frontotemporal craniotomy for tumor resection on 08/09/15. PMH includes DM2, GERD, esophageal dilation, hiatal hernia.       SLP Plan  All goals met     Recommendations  Diet recommendations: Regular;Thin liquid Liquids provided via: Cup;Straw Medication Administration: Whole meds with liquid Supervision: Patient able to self feed              Oral Care Recommendations: Oral care BID Follow up Recommendations: None Plan: All goals met   Dolorez Jeffrey,PAT, M.S., CCC-SLP 08/11/2015, 12:51 PM

## 2015-08-11 NOTE — Progress Notes (Signed)
Patient d/c home, d/c instructions given and patient verbalized understanding.

## 2015-08-11 NOTE — Evaluation (Signed)
Physical Therapy Evaluation and Discharge  Patient Details Name: Michelle Duran MRN: [id number] DOB: 30-Aug-1956 Today's Date: 08/11/2015   History of Present Illness  s/p stereotactic Rt craniotomy to remove brain masses in the Rt frontal lobe and the Rt parietal lobe  Clinical Impression  Patient evaluated by Physical Therapy with no further acute PT needs identified. She is not dizzy, however walks very cautiously and slowly. Imbalance descending steps required assist. Recommended assist on stairs and supervision initially at home (states family will provide). All education has been completed and the patient has no further questions. PT is signing off. Thank you for this referral.     Follow Up Recommendations No PT follow up    Equipment Recommendations  None recommended by PT    Recommendations for Other Services OT consult     Precautions / Restrictions Precautions Precautions: Fall Precaution Comments: notes she has cats at home and sometimes "get underfoot" Restrictions Weight Bearing Restrictions: No      Mobility  Bed Mobility Overal bed mobility: Independent             General bed mobility comments: including linens, HOB flat, no rail  Transfers Overall transfer level: Independent Equipment used: None             General transfer comment: x 2 no imbalance or dizziness  Ambulation/Gait Ambulation/Gait assistance: Min guard;Supervision Ambulation Distance (Feet): 200 Feet Assistive device: None Gait Pattern/deviations: Step-through pattern;Decreased stride length;Wide base of support Gait velocity: slow, able to incr velocity slightly   General Gait Details: guarded, slow; feels slightly unsteady requiring supervision, however no overt LOB  Stairs Stairs: Yes Stairs assistance: Min assist Stair Management: No rails;Step to pattern;Forwards;Two rails (hand held assist on Rt simulate outside steps) Number of Stairs: 5 General stair comments: min  assist for slight imbalance descending steps with HHA; no imbalance with rails (as she has inside)  Wheelchair Mobility    Modified Rankin (Stroke Patients Only)       Balance Overall balance assessment: Needs assistance Sitting-balance support: No upper extremity supported;Feet supported Sitting balance-Leahy Scale: Fair Sitting balance - Comments: >fair not tested   Standing balance support: No upper extremity supported Standing balance-Leahy Scale: Good               High level balance activites: Direction changes;Turns;Sudden stops;Head turns High Level Balance Comments: slight drift to left with head turns (no loss of balance); very slow and cautious due to feeling unsteady     Dynamic Gait Index Level Surface: Mild Impairment Change in Gait Speed: Moderate Impairment Gait with Horizontal Head Turns: Mild Impairment Gait with Vertical Head Turns: Mild Impairment Gait and Pivot Turn: Mild Impairment Step Around Obstacles: Mild Impairment Steps: Moderate Impairment       Pertinent Vitals/Pain      Home Living Family/patient expects to be discharged to:: Private residence Living Arrangements: Children;Other relatives;Parent Available Help at Discharge: Family;Available 24 hours/day (son some; 7 yo mother some) Type of Home: House Home Access: Stairs to enter Entrance Stairs-Rails: None Entrance Stairs-Number of Steps: 3 Home Layout: Two level;Bed/bath upstairs Home Equipment: Bedside commode;Cane - single point (BSC over each toilet for her mother; )      Prior Function Level of Independence: Independent         Comments: no imbalance or falls     Hand Dominance   Dominant Hand: Right    Extremity/Trunk Assessment   Upper Extremity Assessment: Overall WFL for tasks assessed  Lower Extremity Assessment: Overall WFL for tasks assessed      Cervical / Trunk Assessment: Normal  Communication   Communication: No difficulties   Cognition Arousal/Alertness: Awake/alert Behavior During Therapy: WFL for tasks assessed/performed Overall Cognitive Status: Within Functional Limits for tasks assessed                      General Comments      Exercises        Assessment/Plan    PT Assessment [place] does not need any further PT services  PT Diagnosis Difficulty walking   PT Problem List    PT Treatment Interventions     PT Goals (Current goals can be found in the Care Plan section) Acute Rehab PT Goals Patient Stated Goal: go home today PT Goal Formulation: All assessment and education complete, DC therapy    Frequency     Barriers to discharge        Co-evaluation               End of Session Equipment Utilized During Treatment: Gait belt Activity Tolerance: Patient tolerated treatment well Patient left: in bed;with call bell/phone within reach;with bed alarm set Nurse Communication: Mobility status;Other (comment) (no PT or DME needs)         Time: [id number] PT Time Calculation (min) (ACUTE ONLY): 28 min   Charges:   PT Evaluation $Initial PT Evaluation Tier I: 1 Procedure PT Treatments $Gait Training: 8-22 mins   PT G Codes:        Javon Hupfer 13-Aug-2015, 11:16 AM Pager [phone]

## 2015-08-11 NOTE — Progress Notes (Signed)
No acute events Some pain with chewing, as expected AVSS Awake and alert Speech normal Full strength, no drift Incision c/d/i Stable PT/OT today, likely d/c

## 2015-08-11 NOTE — Care Management Note (Signed)
Case Management Note  Patient Details  Name: Kahmari D Grumbine MRN: [id number] Date of Birth: [birth year]  Subjective/Objective:                    Action/Plan: Patient discharging home with self care. Orders for outpatient OT. CM spoke with the patient and she is interested in going to [place]. CM placed the order in EPIC and information on the AVS. Order also for a rolling walker. Jermaine with [facility] notified and will deliver the walker to the room. Bedside RN updated.   Expected Discharge Date:                  Expected Discharge Plan:  Home/Self Care  In-House Referral:     Discharge planning Services     Post Acute Care Choice:    Choice offered to:     DME Arranged:    DME Agency:     HH Arranged:    [place] Agency:     Status of Service:  Completed, signed off  Medicare Important Message Given:    Date Medicare IM Given:    Medicare IM give by:    Date Additional Medicare IM Given:    Additional Medicare Important Message give by:     If discussed at [place] of Stay Meetings, dates discussed:    Additional Comments:  Pollie Friar, RN 08/11/2015, 4:50 PM

## 2015-08-13 LAB — TYPE AND SCREEN
ABO/RH(D): O POS
Antibody Screen: NEGATIVE
UNIT DIVISION: 0
UNIT DIVISION: 0

## 2015-08-15 ENCOUNTER — Encounter: Payer: Self-pay | Admitting: Radiation Oncology

## 2015-08-15 NOTE — Progress Notes (Signed)
Location/Histology of Brain Tumor:    Patient presented on 07/24/15 to the ED with a three days history of facial droop, slurred speech and difficulty swallowing.  Past or anticipated interventions, if any, per neurosurgery: s/p stereotactic right craniotomy to remove right frontal lobe and right temporal lobe brain masses on 08/09/15  Past or anticipated interventions, if any, per medical oncology: none  Dose of Decadron, if applicable: discharged on decadron taper of 4 mg tid for three days then, 4 mg bid  Recent neurologic symptoms, if any:   Seizures: no  Headaches: reports constant dull non radiation occipital headaches when she presented to the ED on 07/24/15.  Nausea: no  Dizziness/ataxia: no  Difficulty with hand coordination: no  Focal numbness/weakness: no  Visual deficits/changes: no  Confusion/Memory deficits: poor short term memory and attention span  Painful bone metastases at present, if any: no  SAFETY ISSUES:  Prior radiation? no  Pacemaker/ICD? no  Possible current pregnancy? no  Is the patient on methotrexate? 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 Discharge from hospital following craniotomy to her home to perform self care and ambulate with the aid of a rolling walker. Outpatient OT ordered at discharge.

## 2015-08-16 NOTE — Progress Notes (Signed)
Radiation Oncology         [phone]) [phone] ________________________________  Initial outpatient Consultation  Name: Michelle Duran MRN: [id number]  Date: 08/17/2015  DOB: 12-21-1955  VH:[id number] Weyman Croon, MD  Ditty, Kevan Ny, *   REFERRING PHYSICIAN: Ditty, Kevan Ny, *  DIAGNOSIS: The encounter diagnosis was Brain tumor, glioma ([place]).    ICD-9-CM ICD-10-CM   1. Brain tumor, glioma ([place]) 191.9 C71.9     HISTORY OF PRESENT ILLNESS::Michelle Duran is a 59 y.o. female who is presented with right facial droop, slurred speech and difficulty swallowing 3 days.  MRI of the brain showed right frontal lobe 4 x 4.7 x 3.7 cm mass with significant surrounding vasogenic edema. Anterior to mid right temporal lobe 4.4 x 2.6 x 3.9 cm mass with significant surrounding vasogenic edema. Mass effect upon the right lateral ventricle with midline shift to the left by 7.3 mm. Early trapping of the left temporal horn not excluded. Compression of the right midbrain/ cerebral peduncle with mild right uncal herniation. The vasogenic edema surrounding the enhancing lesions is confluent through the subinsular/external capsule region. No confluence of the enhancing component. She had stereotactic guided right frontotemporal craniotomy for resection of 2 brain masses of both with Dr. Cyndy Freeze on 12/7 confirming Glioblastoma.  Post-op MRI showed resection of the enhancing lesions.  PREVIOUS RADIATION THERAPY: No  PAST MEDICAL HISTORY:  has a past medical history of Gallstone; Hypertension; Hypothyroidism; Mitral valve prolapse; Fibromyalgia; Anxiety; Vitamin D deficiency; Diabetes mellitus without complication ([place]); Tobacco abuse; Mass of adrenal gland ([place]); Wears glasses; Complication of anesthesia; PVC's (premature ventricular contractions); Vertigo; Numbness and tingling; Arthritis; GERD (gastroesophageal reflux disease); History of hiatal hernia; Nocturia; Family history of adverse reaction to anesthesia; Panic  attacks; and Neoplasm of brain ([place]).    PAST SURGICAL HISTORY: Past Surgical History  Procedure Laterality Date  . Cesarean section    . Multiple tooth extractions    . Esophagus stretched    . Craniotomy Right 08/09/2015    Procedure: Right Frontotemporal craniotomy for tumor resection with stereotactic navigation;  Surgeon: Kevan Ny Ditty, MD;  Location: [facility];  Service: Neurosurgery;  Laterality: Right;  Right Frontotemporal craniotomy for tumor resection with stereotactic navigation    FAMILY HISTORY: family history includes Breast cancer in her paternal grandmother; Hypertension in her father; Ovarian cancer in her cousin.  SOCIAL HISTORY:  Social History   Social History  . Marital Status: Divorced    Spouse Name: N/A  . Number of Children: 1  . Years of Education: N/A   Occupational History  . cafateria Freight forwarder    Social History Main Topics  . Smoking status: Former Smoker -- 1.00 packs/day for 41 years    Types: Cigarettes    Quit date: 07/24/2015  . Smokeless tobacco: Never Used  . Alcohol Use: No  . Drug Use: No  . Sexual Activity: Not Currently   Other Topics Concern  . Not on file   Social History Narrative    ALLERGIES: Codeine; Prednisone; and Sulfonamide derivatives  MEDICATIONS:  Current Outpatient Prescriptions  Medication Sig Dispense Refill  . ACCU-CHEK AVIVA PLUS test strip USE TO CHECK BLOOD SUGAR 4 TIMES A DAY BEFORE MEALS & AT BEDTIME (ICD-9 2500.00,250.01)  0  . ACCU-CHEK SOFTCLIX LANCETS lancets USE TO CHECK BLOOD SUGAR 4 TIMES A DAY BEFORE MEALS & AT BEDTIME  0  . acetaminophen (TYLENOL) 650 MG CR tablet Take 1,300 mg by mouth every 8 (eight) hours as needed for pain.    Marland Kitchen  ALPRAZolam (XANAX) 0.5 MG tablet Take 0.25 mg by mouth 2 (two) times daily.    . BD ULTRA-FINE PEN NEEDLES 29G X 12.7MM MISC USE AS DIRECTED (20 UNITS BY DOES NOT APPLY ROUTE AT BEDTIME)  0  . Blood Gluc Meter Disp-Strips (BLOOD GLUCOSE METER DISPOSABLE) DEVI  1 strip by Does not apply route 4 (four) times daily -  before meals and at bedtime. 300 each 0  . blood glucose meter kit and supplies Dispense based on patient and insurance preference. Use up to four times daily as directed. (FOR ICD-9 250.00, 250.01). 1 each 0  . dexamethasone (DECADRON) 4 MG tablet Take 1 tablet (4 mg total) by mouth 2 (two) times daily. Taper dose down from current dose by taking 4 mg three times daily for three days then 4 mg twice a day and stay on that dose until you return to clinic 60 tablet 0  . Insulin Glargine (LANTUS) 100 UNIT/ML Solostar Pen Inject 20 Units into the skin daily at 10 pm. 15 mL 11  . Insulin Pen Needle (AURORA PEN NEEDLES) 29G X 12MM MISC 20 Units by Does not apply route at bedtime. 100 each 0  . lactose free nutrition (BOOST PLUS) LIQD Take 237 mLs by mouth daily. 30 Can 0  . levETIRAcetam (KEPPRA) 750 MG tablet Take 1 tablet (750 mg total) by mouth 2 (two) times daily. 30 tablet 0  . levothyroxine (SYNTHROID, LEVOTHROID) 50 MCG tablet Take 25 mcg by mouth daily.    Marland Kitchen lisinopril (PRINIVIL,ZESTRIL) 5 MG tablet Take 5 mg by mouth every evening.    . metoprolol succinate (TOPROL-XL) 25 MG 24 hr tablet Take 12.5 mg by mouth every evening.    . pantoprazole (PROTONIX) 40 MG tablet Take 1 tablet (40 mg total) by mouth daily. Switch for any other PPI at similar dose and frequency 10 tablet 0  . PRILOSEC OTC 20 MG tablet Take 40 mg by mouth every morning.  3  . sertraline (ZOLOFT) 50 MG tablet Take 3 tablets (150 mg total) by mouth every evening. 90 tablet 0  . nicotine (NICODERM CQ - DOSED IN MG/24 HOURS) 21 mg/24hr patch Place 1 patch (21 mg total) onto the skin daily. (Patient not taking: Reported on 08/17/2015) 28 patch 0   No current facility-administered medications for this encounter.    REVIEW OF SYSTEMS:  A 15 point review of systems is documented in the electronic medical record. This was obtained by the nursing staff. However, I reviewed this with  the patient to discuss relevant findings and make appropriate changes.  Pertinent items are noted in HPI.  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 Discharge from hospital following craniotomy to her home to perform self care and ambulate with the aid of a rolling walker. Outpatient OT ordered at discharge. Reports constant dull non radiation occipital headaches when she presented to the ED on 07/24/15.   PHYSICAL EXAM:  height is '5\' 2"'$  (1.575 m) and weight is 177 lb 4.8 oz (80.423 kg). Her oral temperature is 98.3 F (36.8 C). Her blood pressure is 112/80 and her pulse is 55. Her respiration is 18 and oxygen saturation is 100%.   Per Internal Medicine HEENT:  Eyes: PERRL, EOMI, no scleral icterus.  ENT: No discharge from the ears and nose, no pharynx injection, no tonsillar enlargement.   Neck: No JVD, no bruit, no mass felt. Heme: No neck lymph node enlargement. Cardiac: S1/S2, RRR, No murmurs, No gallops or rubs. Pulm: No  rales, wheezing, rhonchi or rubs. Abd: Soft, nondistended, nontender, no rebound pain, no organomegaly, BS present. Ext: No pitting leg edema bilaterally. 2+DP/PT pulse bilaterally. Musculoskeletal: No joint deformities, No joint redness or warmth, no limitation of ROM in spin. Skin: No rashes.  Neuro: Alert, oriented X3, cranial nerves II-XII grossly intact except for R facial droop, muscle strength 5/5 in all extremities, sensation to light touch intact. Brachial reflex 1+ bilaterally. Knee reflex 1+ bilaterally. Negative Babinski's sign. Normal finger to nose test. Psych: Patient is not psychotic, no suicidal or hemocidal ideation.  KPS = 90  100 - Normal; no complaints; no evidence of disease. 90   - Able to carry on normal activity; minor signs or symptoms of disease. 80   - Normal activity with effort; some signs or symptoms of disease. 9   - Cares for self; unable to carry on normal activity or to do active work. 60   - Requires occasional  assistance, but is able to care for most of his personal needs. 50   - Requires considerable assistance and frequent medical care. 40   - Disabled; requires special care and assistance. 30   - Severely disabled; hospital admission is indicated although death not imminent. 20   - Very sick; hospital admission necessary; active supportive treatment necessary. 10   - Moribund; fatal processes progressing rapidly. 0     - Dead  Karnofsky DA, Abelmann WH, Craver LS and Burchenal [facility] [phone]) The use of the nitrogen mustards in the palliative treatment of carcinoma: with particular reference to bronchogenic carcinoma Cancer 1 [id number]  LABORATORY DATA:  Lab Results  Component Value Date   WBC 19.9* 08/10/2015   HGB 13.4 08/10/2015   HCT 40.0 08/10/2015   MCV 90.9 08/10/2015   PLT 256 08/10/2015   Lab Results  Component Value Date   NA 135 08/10/2015   K 4.6 08/10/2015   CL 102 08/10/2015   CO2 26 08/10/2015   Lab Results  Component Value Date   ALT 22 07/24/2015   AST 21 07/24/2015   ALKPHOS 112 07/24/2015   BILITOT 0.2* 07/24/2015     RADIOGRAPHY: Ct Chest W Contrast  07/25/2015  CLINICAL DATA:  Recently diagnosed brain masses. Assess for primary malignancy. Initial encounter. EXAM: CT CHEST, ABDOMEN, AND PELVIS WITH CONTRAST TECHNIQUE: Multidetector CT imaging of the chest, abdomen and pelvis was performed following the standard protocol during bolus administration of intravenous contrast. CONTRAST:  OMNIPAQUE IOHEXOL 300 MG/ML  SOLN COMPARISON:  CT of the abdomen and pelvis performed 02/02/2010, and chest radiograph performed 11/25/2010 FINDINGS: CT CHEST FINDINGS Minimal bibasilar atelectasis is noted. The lungs are otherwise clear. There is no evidence of focal consolidation, pleural effusion or pneumothorax. No masses are seen. The mediastinum is unremarkable in appearance. No mediastinal lymphadenopathy is seen. No pericardial effusions identified. The great vessels are grossly  unremarkable in appearance. The thyroid gland is grossly unremarkable. No axillary lymphadenopathy is appreciated. No acute osseous abnormalities are identified. CT ABDOMEN PELVIS FINDINGS The liver and spleen are unremarkable in appearance. The gallbladder is within normal limits. The pancreas and left adrenal gland are unremarkable. A 1.3 cm right adrenal nodule is stable from 2011 and likely benign. The kidneys are unremarkable in appearance. There is no evidence of hydronephrosis. No renal or ureteral stones are seen. No perinephric stranding is appreciated. No free fluid is identified. The small bowel is unremarkable in appearance. The stomach is within normal limits. No acute vascular abnormalities are seen. The  appendix is normal in caliber and contains air, without evidence for appendicitis. The colon is partially filled with contrast, and is unremarkable in appearance. The bladder is mildly distended and grossly unremarkable. The uterus is grossly unremarkable in appearance. The ovaries are relatively symmetric. No suspicious adnexal masses are seen. No inguinal lymphadenopathy is seen. No acute osseous abnormalities are identified. Facet disease is noted at the lower lumbar spine. IMPRESSION: 1. No acute abnormality seen within the chest, abdomen or pelvis. No evidence for primary malignancy. 2. 1.3 cm right adrenal nodule is stable from 2011 and likely benign. 3. Minimal bibasilar atelectasis noted.  Lungs otherwise clear. Electronically Signed   By: Garald Balding M.D.   On: 07/25/2015 02:33   Mr Jeri Cos DZ Contrast  08/10/2015  CLINICAL DATA:  59 year old female with multifocal right hemisphere tumor status post resection yesterday. Metastatic disease versus primary, pathology pending. Initial encounter. EXAM: MRI HEAD WITHOUT AND WITH CONTRAST TECHNIQUE: Multiplanar, multiecho pulse sequences of the brain and surrounding structures were obtained without and with intravenous contrast. CONTRAST:  10m  MULTIHANCE GADOBENATE DIMEGLUMINE 529 MG/ML IV SOLN COMPARISON:  Preoperative brain MRI 07/25/2015 and 07/24/2015. FINDINGS: Sequelae of right frontotemporal craniotomy. Resection cavity at the more superior tumor site (series 12, image 34) with blood products, very mild surrounding enhancement. Marked regression of surrounding T2 and FLAIR hyperintensity most resembling vasogenic edema. The more inferior, resection cavity also at the more inferior tumor site, right temporal lobe. Blood products within the cavity. Mild residual peripheral enhancement at the cavity. Marked regression of surrounding edema. There is a small 5-6 mm somewhat linear focus of enhancement in the anterior right frontal lobe adjacent to the cingulate gyrus (series 12, image 27). This is stable since 07/24/2015. No definite associated edema. No other abnormal intracranial enhancement identified. Regressed midline shift. Improved patency of the right lateral ventricle. Occasional small foci of restricted diffusion at both resection sites in keeping with mild postoperative ischemia. Anterior superior resection site of the superior tumor involved (series 4, image 33) and posterior cavity of the inferior tumor involved (image 20). Small volume pneumocephalus. Basilar cisterns remain patent. Major intracranial vascular flow voids are stable. Small volume right convexity extra-axial collection. Mild to moderate overlying scalp hematoma. Outside of the immediate resection area no restricted diffusion or evidence of acute infarction. No new gray or white matter signal abnormality. No ventriculomegaly. Trace intraventricular hemorrhage (occipital horns). Negative pituitary, posterior fossa and visualized cervical spine. Stable visualized bone marrow signal, normal at the skullbase and in the upper cervical spine. Marrow signal mildly decreased in the calvarium as before. Visible internal auditory structures appear normal. Left mastoid effusion has  regressed. Paranasal sinuses remain clear. Stable and negative orbit soft tissues. IMPRESSION: 1. Satisfactory postoperative appearance status post resection of the 4-5 cm right frontal and right temporal lobe masses. 2. Small indeterminate 5-6 mm enhancing lesion adjacent to the anterior right cingulate gyrus. Attention directed on followup MRI. Electronically Signed   By: HGenevie AnnM.D.   On: 08/10/2015 06:52   Mr BJeri CosWHGContrast  07/26/2015  CLINICAL DATA:  Initial evaluation for brain tumor. Stereotactic protocol for surgical planning. EXAM: MRI HEAD WITHOUT AND WITH CONTRAST TECHNIQUE: Multiplanar, multiecho pulse sequences of the brain and surrounding structures were obtained without and with intravenous contrast. CONTRAST:  123mMULTIHANCE GADOBENATE DIMEGLUMINE 529 MG/ML IV SOLN COMPARISON:  Previous MRI from 07/24/2015. FINDINGS: Heterogeneous Lee enhancing right frontal lobe mass again seen, measures 4.7 x 3.6 x 3.3 cm.  Probable necrosis with possible hemorrhagic blood products again seen. Surrounding vasogenic edema is similar. Heterogeneous Lee enhancing anterior mid right temporal lobe mass again seen, measures 4.1 x 3.1 x 2.2 cm. Probable necrosis with hemorrhagic blood products again seen. Significant surrounding vasogenic edema. Associated mass effect on the right lateral ventricle which is partially effaced. There is 7 mm of right-to-left shift. Compression of the right midbrain/cerebral peduncle with mild right uncal herniation. Possible early trapping of the temporal horn of the right lateral ventricle not excluded, stable. Vasogenic edema surrounding these 2 masses is confluent through the subinsular/external capsule region. Enhancing component appears separate. Small blush of contrast enhancement within the parasagittal anterior right frontal lobe noted (series 11, image 14), stable from previous. No associated vasogenic edema within this region. This may be vascular in nature. No other  mass lesion. No infarct. Major intracranial vascular flow voids are maintained. Mild scatter white matter changes present. Craniocervical junction normal. Pituitary gland normal. No acute abnormality about the orbits. Paranasal sinuses are clear. Scattered opacity within the mastoid air cells, left greater than right. Inner ear structures normal. Decreased signal intensity within the visualized bone marrow, which may be related to habitus or anemia. Scalp soft tissues demonstrate no acute abnormality. IMPRESSION: 1. Stable right frontal lobe and anterior mid right temporal lobe masses. 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 Surrounding vasogenic edema with associated mass effect and 7 mm of right-to-left shift is stable as well. This study will be use for intraoperative guidance purposes. 2. No new intracranial process. Electronically Signed   By: Jeannine Boga M.D.   On: 07/26/2015 00:27   Mr Jeri Cos EG Contrast  07/24/2015  CLINICAL DATA:  59 year old diabetic female presenting with facial droop and slurred speech with onset 3 days ago. Initial encounter. EXAM: MRI HEAD WITHOUT AND WITH CONTRAST TECHNIQUE: Multiplanar, multiecho pulse sequences of the brain and surrounding structures were obtained without and with intravenous contrast. CONTRAST:  38m MULTIHANCE GADOBENATE DIMEGLUMINE 529 MG/ML IV SOLN COMPARISON:  06/23/2006 head CT.  No comparison brain MR. FINDINGS: Right frontal lobe 4 x 4.7 x 3.7 cm irregular necrotic possibly partially hemorrhagic mass with significant surrounding vasogenic edema. Anterior to mid right temporal lobe 4.4 x 2.6 x 3.9 cm irregular enhancing possibly necrotic/ hemorrhagic mass with significant surrounding vasogenic edema. Mass effect upon the right lateral ventricle with midline shift to the left by 7.3 mm. Early trapping of the left temporal horn not excluded. Compression of the right midbrain/ cerebral peduncle  is with mild right uncal herniation. The vasogenic edema surrounding the enhancing lesions is confluent through the subinsular/external capsule region. No confluence of the enhancing component. It is possible this represents presence of metastatic disease although primary multi focal glioma blastoma is also consideration. No MR evidence to suggest infection. No acute thrombotic infarct. Partial opacification left mastoid air cells without obstructing lesion noted. Minimal mucosal thickening ethmoid sinus air cells. Major intracranial vascular structures are patent. Decreased signal intensity of bone marrow may be related to patient's habitus. Correlation with CBC to exclude anemia contributing to this appearance may be considered IMPRESSION: Right frontal lobe 4 x 4.7 x 3.7 cm mass with significant surrounding vasogenic edema. Anterior to mid right temporal lobe 4.4 x 2.6 x 3.9 cm mass with significant surrounding vasogenic edema. Mass effect upon the right lateral ventricle with midline shift to the left by 7.3 mm. Early trapping of the left temporal horn not excluded. Compression of the  right midbrain/ cerebral peduncle with mild right uncal herniation. The vasogenic edema surrounding the enhancing lesions is confluent through the subinsular/external capsule region. No confluence of the enhancing component. Question primary multi focal glioblastoma versus metastatic disease. These results were called by telephone at the time of interpretation on 07/24/2015 at 7:40 pm to Dr. Nathaniel Man , who verbally acknowledged these results. Electronically Signed   By: Genia Del M.D.   On: 07/24/2015 19:55   Ct Abdomen Pelvis W Contrast  07/25/2015  CLINICAL DATA:  Recently diagnosed brain masses. Assess for primary malignancy. Initial encounter. EXAM: CT CHEST, ABDOMEN, AND PELVIS WITH CONTRAST TECHNIQUE: Multidetector CT imaging of the chest, abdomen and pelvis was performed following the standard protocol during  bolus administration of intravenous contrast. CONTRAST:  184m OMNIPAQUE IOHEXOL 300 MG/ML  SOLN COMPARISON:  CT of the abdomen and pelvis performed 02/02/2010, and chest radiograph performed 11/25/2010 FINDINGS: CT CHEST FINDINGS Minimal bibasilar atelectasis is noted. The lungs are otherwise clear. There is no evidence of focal consolidation, pleural effusion or pneumothorax. No masses are seen. The mediastinum is unremarkable in appearance. No mediastinal lymphadenopathy is seen. No pericardial effusions identified. The great vessels are grossly unremarkable in appearance. The thyroid gland is grossly unremarkable. No axillary lymphadenopathy is appreciated. No acute osseous abnormalities are identified. CT ABDOMEN PELVIS FINDINGS The liver and spleen are unremarkable in appearance. The gallbladder is within normal limits. The pancreas and left adrenal gland are unremarkable. A 1.3 cm right adrenal nodule is stable from 2011 and likely benign. The kidneys are unremarkable in appearance. There is no evidence of hydronephrosis. No renal or ureteral stones are seen. No perinephric stranding is appreciated. No free fluid is identified. The small bowel is unremarkable in appearance. The stomach is within normal limits. No acute vascular abnormalities are seen. The appendix is normal in caliber and contains air, without evidence for appendicitis. The colon is partially filled with contrast, and is unremarkable in appearance. The bladder is mildly distended and grossly unremarkable. The uterus is grossly unremarkable in appearance. The ovaries are relatively symmetric. No suspicious adnexal masses are seen. No inguinal lymphadenopathy is seen. No acute osseous abnormalities are identified. Facet disease is noted at the lower lumbar spine. IMPRESSION: 1. No acute abnormality seen within the chest, abdomen or pelvis. No evidence for primary malignancy. 2. 1.3 cm right adrenal nodule is stable from 2011 and likely benign.  3. Minimal bibasilar atelectasis noted.  Lungs otherwise clear. Electronically Signed   By: JGarald BaldingM.D.   On: 07/25/2015 02:33      IMPRESSION: Ms. DZemaitisis a pleasant 59year old woman with multifocal glioblastoma.  PLAN: Today, I talked to the patient and family about the findings and work-up thus far.  We discussed the natural history of glioblastoma and general treatment, highlighting the role of radiotherapy in the management.  We discussed the available radiation techniques, and focused on the details of logistics and delivery.  We reviewed the anticipated acute and late sequelae associated with radiation in this setting.  The patient was encouraged to ask questions that I answered to the best of my ability.  I filled out a patient counseling form during our discussion including treatment diagrams.  We retained a copy for our records.  The patient would like to proceed with radiation and will be scheduled for CT simulation.  I spent 30 minutes minutes face to face with the patient and more than 50% of that time was spent in counseling and/or  coordination of care.   ------------------------------------------------  Sheral Apley. Tammi Klippel, M.D.   This document serves as a record of services personally performed by Tyler Pita, MD. It was created on his behalf by Arlyce Harman, a trained medical scribe. The creation of this record is based on the scribe's personal observations and the provider's statements to them. This document has been checked and approved by the attending provider.

## 2015-08-16 NOTE — Progress Notes (Signed)
  Radiation Oncology         [phone]) [phone] ________________________________  Name: Michelle Duran MRN: [id number]  Date: 08/17/2015  DOB: [birth year]  SIMULATION AND TREATMENT PLANNING NOTE    ICD-9-CM ICD-10-CM   1. 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  Identity was confirmed.  All relevant records and images related to the planned course of therapy were reviewed.  The patient freely provided informed written consent to proceed with treatment after reviewing the details related to the planned course of therapy. The consent form was witnessed and verified by the simulation staff.  Then, the patient was set-up in a stable reproducible  supine position for radiation therapy.  CT images were obtained.  Surface markings were placed.  The CT images were loaded into the planning software.  Then the target and avoidance structures were contoured.  Treatment planning then occurred.  The radiation prescription was entered and confirmed.  Then, I designed and supervised the construction of a total of one medically necessary complex treatment device as a mask.  I have requested : Intensity Modulated Radiotherapy (IMRT) is medically necessary for this case for the following reason:  Critical CNS structure avoidance - brainstem, optic chiasm, optic nerve..  I have ordered:CBC  SPECIAL TREATMENT PROCEDURE:  The planned course of therapy using radiation constitutes a special treatment procedure. Special care is required in the management of this patient for the following reasons. This treatment constitutes a Special Treatment Procedure for the following reason: [ Concurrent chemotherapy requiring careful monitoring for increased toxicities of treatment including weekly laboratory values.  The special  nature of the planned course of radiotherapy will require increased physician supervision and oversight to ensure patient's safety with optimal treatment outcomes.  PLAN:  The whole brain will be treated to 60 Gy in 30 fractions.  ________________________________  Sheral Apley Tammi Klippel, M.D.  This document serves as a record of services personally performed by Tyler Pita, MD. It was created on his behalf by Arlyce Harman, a trained medical scribe. The creation of this record is based on the scribe's personal observations and the provider's statements to them. This document has been checked and approved by the attending provider.

## 2015-08-17 ENCOUNTER — Ambulatory Visit
Admission: RE | Admit: 2015-08-17 | Discharge: 2015-08-17 | Disposition: A | Discharge status: Home / Self Care | Payer: BC Managed Care – PPO | Source: Ambulatory Visit | Attending: Radiation Oncology | Admitting: Radiation Oncology

## 2015-08-17 ENCOUNTER — Encounter: Payer: Self-pay | Admitting: Radiation Oncology

## 2015-08-17 VITALS — BP 112/80 | HR 55 | Temp 98.3°F | Resp 18 | Ht 62.0 in | Wt 177.3 lb

## 2015-08-17 DIAGNOSIS — E278 Other specified disorders of adrenal gland: Secondary | ICD-10-CM | POA: Diagnosis not present

## 2015-08-17 DIAGNOSIS — Z510 Encounter for antineoplastic radiation therapy: Secondary | ICD-10-CM | POA: Insufficient documentation

## 2015-08-17 DIAGNOSIS — I10 Essential (primary) hypertension: Secondary | ICD-10-CM | POA: Diagnosis present

## 2015-08-17 DIAGNOSIS — I341 Nonrheumatic mitral (valve) prolapse: Secondary | ICD-10-CM | POA: Insufficient documentation

## 2015-08-17 DIAGNOSIS — I493 Ventricular premature depolarization: Secondary | ICD-10-CM | POA: Diagnosis not present

## 2015-08-17 DIAGNOSIS — Z720 Tobacco use: Secondary | ICD-10-CM | POA: Insufficient documentation

## 2015-08-17 DIAGNOSIS — C719 Malignant neoplasm of brain, unspecified: Secondary | ICD-10-CM

## 2015-08-17 DIAGNOSIS — E119 Type 2 diabetes mellitus without complications: Secondary | ICD-10-CM | POA: Diagnosis not present

## 2015-08-17 DIAGNOSIS — K219 Gastro-esophageal reflux disease without esophagitis: Secondary | ICD-10-CM | POA: Insufficient documentation

## 2015-08-17 NOTE — Addendum Note (Signed)
Encounter addended by: Heywood Footman, RN on: 08/17/2015 12:06 PM<BR>     Documentation filed: Inpatient Patient Education

## 2015-08-17 NOTE — Addendum Note (Signed)
Encounter addended by: Tyler Pita, MD on: 08/17/2015  9:15 AM<BR>     Documentation filed: Follow-up Section, LOS Section

## 2015-08-17 NOTE — Progress Notes (Signed)
See progress note under physician encounter. 

## 2015-08-17 NOTE — Addendum Note (Signed)
Encounter addended by: Jacqulyn Liner, RN on: 08/17/2015  8:59 AM<BR>     Documentation filed: Charges VN

## 2015-08-17 NOTE — Progress Notes (Signed)
Patient is here with her mother. She has a right scalp incision that is intact.  She denies pain today. She is taking decadron 4 mg TID and is confused as to when to taper to BID.  She is ambulating independently.  She denies vision changes and nausea and vomiting.  She reports problems with short term memory.    BP 112/80 mmHg  Pulse 55  Temp(Src) 98.3 F (36.8 C) (Oral)  Resp 18  Ht 5\' 2"  (1.575 m)  Wt 177 lb 4.8 oz (80.423 kg)  BMI 32.42 kg/m2  SpO2 100%   Wt Readings from Last 3 Encounters:  08/17/15 177 lb 4.8 oz (80.423 kg)  08/09/15 183 lb 3.2 oz (83.1 kg)  08/07/15 183 lb 3.2 oz (83.099 kg)

## 2015-08-17 NOTE — Addendum Note (Signed)
Encounter addended by: Jacqulyn Liner, RN on: 08/17/2015  8:56 AM<BR>     Documentation filed: [facility] VN

## 2015-08-18 ENCOUNTER — Encounter: Payer: Self-pay | Admitting: Hematology and Oncology

## 2015-08-18 ENCOUNTER — Ambulatory Visit (HOSPITAL_BASED_OUTPATIENT_CLINIC_OR_DEPARTMENT_OTHER): Payer: BC Managed Care – PPO | Admitting: Hematology and Oncology

## 2015-08-18 ENCOUNTER — Telehealth: Payer: Self-pay | Admitting: Hematology and Oncology

## 2015-08-18 VITALS — BP 108/53 | HR 59 | Temp 98.0°F | Resp 18 | Ht 62.0 in | Wt 179.4 lb

## 2015-08-18 DIAGNOSIS — C719 Malignant neoplasm of brain, unspecified: Secondary | ICD-10-CM

## 2015-08-18 DIAGNOSIS — C712 Malignant neoplasm of temporal lobe: Secondary | ICD-10-CM | POA: Diagnosis not present

## 2015-08-18 DIAGNOSIS — C711 Malignant neoplasm of frontal lobe: Secondary | ICD-10-CM

## 2015-08-18 MED ORDER — TEMOZOLOMIDE 180 MG PO CAPS: 180.0000 mg | ORAL_CAPSULE | Freq: Every day | ORAL | Status: DC

## 2015-08-18 NOTE — Addendum Note (Signed)
Addended by: Prentiss Bells on: 08/18/2015 12:17 PM   Modules accepted: Orders, Medications

## 2015-08-18 NOTE — Telephone Encounter (Signed)
Appointments made and avs pritned for patient °

## 2015-08-18 NOTE — Assessment & Plan Note (Signed)
Brain MRI 07/24/2015: Heterogeneous enhancing right frontal lobe mass 4.7 x 3.6 x 3.3 cm with vasogenic edema, heterogeneous enhancing mid right temporal lobe mass 4.1 x 3.1 x 2.2 cm with edema and right-to-left shift and mass effect. 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 The classification ranges from [facility] grade 1-4. WHO grade 4 glioma is glioblastoma. I discussed the pathology report and provided him with a copy of this report.  Treatment recommendation: Concurrent chemoradiation with daily temozolomide at 75 mg/m (180 mg dose) followed by maintenance Temodar 5 days every month (first month 150 mg/m, second month onwards 200 mg/m) X 12 months  Temodar counseling: I discussed the risks and benefits of temozolomide including the risk of nausea, fatigue, alopecia, diarrhea, cytopenias especially neutropenia and thrombocytopenia, risk of viral infections, risk of pneumocystis carinii infection. Patient understands these risks and consented to proceed with treatment  Prognosis: Overall survival was significantly improved in patients randomly assigned to receive radiation plus temozolomide compared with radiation alone (9.3 versus 7.6 months). Progression-free survival was also improved (5.3 versus 3.9 months). MGMT methylation and IDH mutations were sent, which might help with determining prognosis.

## 2015-08-18 NOTE — Progress Notes (Signed)
[facility] CONSULT NOTE  Patient Care Team: Carol Ada, MD as PCP - General (Family Medicine)  CHIEF COMPLAINTS/PURPOSE OF CONSULTATION:  Newly diagnosed brain tumor  HISTORY OF PRESENTING ILLNESS:  Michelle Duran 59 y.o. female is here because of recent diagnosis of glioblastoma multiform he involving the right frontal and right temporal lobes. She woke up on November 21 with complaints of drooping of the face and facial paralysis. She initially went to work and from there she went to the emergency room and she was diagnosed with 2 tumors in the right half of the brain. She was placed on dexamethasone and was discharged. She finally underwent brain surgery on 08/09/2015 and right frontal 2.3 cm tumor and right temporal lobe 2.4 cm tumors were removed. Pathology came back as glioblastoma multiforme. She saw Dr. Tammi Klippel yesterday who recommended concurrent chemoradiation and she is here today to discuss the chemotherapy part of her treatment.   I reviewed her records extensively and collaborated the history with the patient.  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 Gallstone   . Hypertension   . Hypothyroidism   . Mitral valve prolapse   . Fibromyalgia   . Anxiety   . Vitamin D deficiency   . Diabetes mellitus without complication ([place])   . Tobacco abuse   . Mass of adrenal gland ([place])   . Wears glasses   . Complication of anesthesia     Pt was shaking after having epidural  during c-section   . PVC's (premature ventricular contractions)     saw cardiologist Dr. Claiborne Billings > 3 years ago with echo and stress (entered 08/08/2015)  . Vertigo   . Numbness and tingling     fingers and toes  . Arthritis   . GERD (gastroesophageal reflux disease)   . History of hiatal hernia   . Nocturia   . Family history of adverse reaction to anesthesia     mother had PONV  . Panic attacks   . Neoplasm of brain [facility])     SURGICAL HISTORY: Past Surgical History  Procedure Laterality Date  . Cesarean section    . Multiple tooth extractions    . Esophagus stretched    . Craniotomy Right 08/09/2015    Procedure: Right Frontotemporal craniotomy for tumor resection with stereotactic navigation;  Surgeon: Kevan Ny Ditty, MD;  Location: [facility];  Service: Neurosurgery;  Laterality: Right;  Right Frontotemporal craniotomy for tumor resection with stereotactic navigation    SOCIAL HISTORY: Social History   Social History  . Marital Status: Divorced    Spouse Name: N/A  . Number of Children: 1  . Years of Education: N/A   Occupational History  . cafateria Freight forwarder    Social History Main Topics  . Smoking status: Former Smoker -- 1.00 packs/day for 41 years    Types: Cigarettes    Quit date: 07/24/2015  . Smokeless tobacco: Never Used  . Alcohol Use: No  . Drug Use: No  . Sexual Activity: Not  Currently   Other Topics Concern  . Not on file   Social History Narrative    FAMILY HISTORY: Family History  Problem Relation Age of Onset  . Hypertension Father   . Glaucoma    . Ovarian cancer Cousin   . Breast cancer Paternal Grandmother     ALLERGIES:  is allergic to codeine; prednisone; and sulfonamide derivatives.  MEDICATIONS:  Current Outpatient Prescriptions  Medication Sig Dispense Refill  . ACCU-CHEK AVIVA PLUS test strip USE TO CHECK BLOOD SUGAR 4 TIMES A DAY BEFORE MEALS & AT BEDTIME (ICD-9 2500.00,250.01)  0  . ACCU-CHEK SOFTCLIX LANCETS lancets USE TO CHECK BLOOD  SUGAR 4 TIMES A DAY BEFORE MEALS & AT BEDTIME  0  . acetaminophen (TYLENOL) 650 MG CR tablet Take 1,300 mg by mouth every 8 (eight) hours as needed for pain.    Marland Kitchen ALPRAZolam (XANAX) 0.5 MG tablet Take 0.25 mg by mouth 2 (two) times daily.    . BD ULTRA-FINE PEN NEEDLES 29G X 12.7MM MISC USE AS DIRECTED (20 UNITS BY DOES NOT APPLY ROUTE AT BEDTIME)  0  . Blood Gluc Meter Disp-Strips (BLOOD GLUCOSE METER DISPOSABLE) DEVI 1 strip by Does not apply route 4 (four) times daily -  before meals and at bedtime. 300 each 0  . blood glucose meter kit and supplies Dispense based on patient and insurance preference. Use up to four times daily as directed. (FOR ICD-9 250.00, 250.01). 1 each 0  . dexamethasone (DECADRON) 4 MG tablet Take 1 tablet (4 mg total) by mouth 2 (two) times daily. Taper dose down from current dose by taking 4 mg three times daily for three days then 4 mg twice a day and stay on that dose until you return to clinic 60 tablet 0  . Insulin Glargine (LANTUS) 100 UNIT/ML Solostar Pen Inject 20 Units into the skin daily at 10 pm. 15 mL 11  . Insulin Pen Needle (AURORA PEN NEEDLES) 29G X 12MM MISC 20 Units by Does not apply route at bedtime. 100 each 0  . lactose free nutrition (BOOST PLUS) LIQD Take 237 mLs by mouth daily. 30 Can 0  . levETIRAcetam (KEPPRA) 750 MG tablet Take 1 tablet (750 mg total) by mouth 2 (two) times daily. 30 tablet 0  . levothyroxine (SYNTHROID, LEVOTHROID) 50 MCG tablet Take 25 mcg by mouth daily.    Marland Kitchen lisinopril (PRINIVIL,ZESTRIL) 5 MG tablet Take 5 mg by mouth every evening.    . metoprolol succinate (TOPROL-XL) 25 MG 24 hr tablet Take 12.5 mg by mouth every evening.    . nicotine (NICODERM CQ - DOSED IN MG/24 HOURS) 21 mg/24hr patch Place 1 patch (21 mg total) onto the skin daily. (Patient not taking: Reported on 08/17/2015) 28 patch 0  . pantoprazole (PROTONIX) 40 MG tablet Take 1 tablet (40 mg total) by mouth daily. Switch for any other PPI at similar dose and  frequency 10 tablet 0  . PRILOSEC OTC 20 MG tablet Take 40 mg by mouth every morning.  3  . sertraline (ZOLOFT) 50 MG tablet Take 3 tablets (150 mg total) by mouth every evening. 90 tablet 0   No current facility-administered medications for this visit.    REVIEW OF SYSTEMS:   Constitutional: Denies fevers, chills or abnormal night sweats, surgical incisions on the right temporal area and the frontal areas Eyes: mild blurring of vision Ears, nose, mouth, throat, and face: Denies mucositis or sore throat Respiratory: Denies cough, dyspnea or wheezes Cardiovascular: Denies palpitation,  chest discomfort or lower extremity swelling Gastrointestinal:  Denies nausea, heartburn or change in bowel habits Skin: Denies abnormal skin rashes Lymphatics: Denies new lymphadenopathy or easy bruising Neurological: right arm paresthesias Behavioral/Psych: Mood is stable, no new changes  All other systems were reviewed with the patient and are negative.  PHYSICAL EXAMINATION: ECOG PERFORMANCE STATUS: 1 - Symptomatic but completely ambulatory  Filed Vitals:   08/18/15 0835  BP: 108/53  Pulse: 59  Temp: 98 F (36.7 C)  Resp: 18   Filed Weights   08/18/15 0835  Weight: 179 lb 6.4 oz (81.375 kg)    GENERAL:alert, no distress and comfortable SKIN: skin color, texture, turgor are normal, no rashes or significant lesions EYES: normal, conjunctiva are pink and non-injected, sclera clear OROPHARYNX:no exudate, no erythema and lips, buccal mucosa, and tongue normal  NECK: supple, thyroid normal size, non-tender, without nodularity LYMPH:  no palpable lymphadenopathy in the cervical, axillary or inguinal LUNGS: clear to auscultation and percussion with normal breathing effort HEART: regular rate & rhythm and no murmurs and no lower extremity edema ABDOMEN:abdomen soft, non-tender and normal bowel sounds Musculoskeletal:no cyanosis of digits and no clubbing  PSYCH: alert & oriented x 3 with fluent  speech NEURO: no focal motor/sensory deficits  LABORATORY DATA:  I have reviewed the data as listed Lab Results  Component Value Date   WBC 19.9* 08/10/2015   HGB 13.4 08/10/2015   HCT 40.0 08/10/2015   MCV 90.9 08/10/2015   PLT 256 08/10/2015   Lab Results  Component Value Date   NA 135 08/10/2015   K 4.6 08/10/2015   CL 102 08/10/2015   CO2 26 08/10/2015   ASSESSMENT AND PLAN:  Glioblastoma multiforme of brain (HCC) Brain MRI 07/24/2015: Heterogeneous enhancing right frontal lobe mass 4.7 x 3.6 x 3.3 cm with vasogenic edema, heterogeneous enhancing mid right temporal lobe mass 4.1 x 3.1 x 2.2 cm with edema and right-to-left shift and mass effect. 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 (IDH and MGMT Pending)  Glioblastoma multiforme: I discussed with them the classification of gliomas. The classification ranges from [facility] grade 1-4. WHO grade 4 glioma is glioblastoma. I discussed the pathology report and provided him with a copy of this report.  Treatment recommendation: Concurrent chemoradiation with daily temozolomide at 75 mg/m (180 mg dose) followed by maintenance Temodar 5 days every month (first month 150 mg/m, second month onwards 200 mg/m) X 12 months  Temodar counseling: I discussed the risks and benefits of temozolomide including the risk of nausea, fatigue, alopecia, diarrhea, cytopenias especially neutropenia and thrombocytopenia, risk of viral infections, risk of pneumocystis carinii infection. Patient understands these risks and consented to proceed with treatment  Prognosis: Overall survival was significantly improved in patients randomly assigned to receive radiation plus temozolomide compared with radiation alone (9.3 versus 7.6 months). Progression-free survival was also improved (5.3 versus 3.9 months). MGMT methylation and IDH mutations were sent, which might help with  determining prognosis.   All questions were answered. The patient knows to call the clinic with any problems, questions or concerns.    Rulon Eisenmenger, MD 08/18/2015

## 2015-08-18 NOTE — Progress Notes (Signed)
Per CoverMyMeds - no prior authorization required for Temodar.  Per [employer], specialty pharmacy [facility] required.  Prescription faxed to [facility] - sent to scan.

## 2015-08-21 ENCOUNTER — Encounter (HOSPITAL_COMMUNITY): Payer: Self-pay

## 2015-08-21 ENCOUNTER — Telehealth: Payer: Self-pay | Admitting: *Deleted

## 2015-08-21 NOTE — Telephone Encounter (Signed)
Received call from [facility] to clarify doseage of Temodar. Clarified with Dr. Lindi Adie and patient will need 42 pills vs quantity of 72. Called and verified with pharmacy.

## 2015-08-22 ENCOUNTER — Telehealth: Payer: Self-pay

## 2015-08-22 NOTE — Telephone Encounter (Signed)
Michelle Duran from [facility] called stating they are trying to set up delivery. They have LVM with pt. They are asking [place] to reach out to pt also. I also LVM on cell phone.

## 2015-08-22 NOTE — Telephone Encounter (Signed)
Patient advocate from [facility] called to ask what grade glioblastoma pt had. Told her WHO grade 4.

## 2015-08-23 NOTE — Telephone Encounter (Signed)
   Can you inform her to call [facility] to get Temodar delivered to her    Thanks     In response to your request, I left a voicemail to let her know that we are trying to get in touch with her to ensure that her Temodar is shipped out. I have asked her to call me back and will let you know if and when I get a response.   Thanks, Alesia Richards

## 2015-08-25 ENCOUNTER — Encounter (HOSPITAL_COMMUNITY): Payer: Self-pay

## 2015-08-25 DIAGNOSIS — Z510 Encounter for antineoplastic radiation therapy: Secondary | ICD-10-CM | POA: Diagnosis not present

## 2015-08-29 ENCOUNTER — Ambulatory Visit
Admission: RE | Admit: 2015-08-29 | Discharge: 2015-08-29 | Disposition: A | Discharge status: Home / Self Care | Payer: BC Managed Care – PPO | Source: Ambulatory Visit | Attending: Radiation Oncology | Admitting: Radiation Oncology

## 2015-08-29 DIAGNOSIS — Z510 Encounter for antineoplastic radiation therapy: Secondary | ICD-10-CM | POA: Diagnosis not present

## 2015-08-30 ENCOUNTER — Ambulatory Visit
Admission: RE | Admit: 2015-08-30 | Discharge: 2015-08-30 | Disposition: A | Discharge status: Home / Self Care | Payer: BC Managed Care – PPO | Source: Ambulatory Visit | Attending: Radiation Oncology | Admitting: Radiation Oncology

## 2015-08-30 DIAGNOSIS — Z510 Encounter for antineoplastic radiation therapy: Secondary | ICD-10-CM | POA: Diagnosis not present

## 2015-08-31 ENCOUNTER — Ambulatory Visit
Admission: RE | Admit: 2015-08-31 | Discharge: 2015-08-31 | Disposition: A | Discharge status: Home / Self Care | Payer: BC Managed Care – PPO | Source: Ambulatory Visit | Attending: Radiation Oncology | Admitting: Radiation Oncology

## 2015-08-31 DIAGNOSIS — Z510 Encounter for antineoplastic radiation therapy: Secondary | ICD-10-CM | POA: Diagnosis not present

## 2015-09-01 ENCOUNTER — Ambulatory Visit
Admission: RE | Admit: 2015-09-01 | Discharge: 2015-09-01 | Disposition: A | Discharge status: Home / Self Care | Payer: BC Managed Care – PPO | Source: Ambulatory Visit | Attending: Radiation Oncology | Admitting: Radiation Oncology

## 2015-09-01 ENCOUNTER — Encounter: Payer: Self-pay | Admitting: Radiation Oncology

## 2015-09-01 VITALS — BP 116/65 | HR 78 | Resp 16 | Wt 184.5 lb

## 2015-09-01 DIAGNOSIS — C719 Malignant neoplasm of brain, unspecified: Secondary | ICD-10-CM | POA: Insufficient documentation

## 2015-09-01 DIAGNOSIS — Z510 Encounter for antineoplastic radiation therapy: Secondary | ICD-10-CM | POA: Diagnosis not present

## 2015-09-01 NOTE — Progress Notes (Addendum)
Weight and vitals stable. Denies pain. Reports an occasional "twing" at the surgical site. Denies taking Vicodin to manage pain but, does reports taking a Tylenol occasionally. Right parietal scalp surgical incision well approximated without redness, drainage or edema. No hyperpigmentation or desquamation of treatment area noted. Reports taking Decadron 2 mg bid. No thrush noted. Denies nausea or vomiting so long as she take Zofran thirty minutes before her temodar. Reports she has stopped smoking. Reports mild fatigue. Steady gait noted. Answers all questions quickly and appropriately with fluid speech. Oriented patient to staff and routine of the clinic. Provided patient with RADIATION THERAPY AND YOU handbook then, reviewed pertinent information. Educated patient reference potential side effects and management such as fatigue, skin changes, headache, nausea and vomiting. Provided patient with Sonafine cream and directed upon use. Provided patient with my business card and encouraged her to call with needs. Patient verbalized understanding of all reviewed.   BP 116/65 mmHg  Pulse 78  Resp 16  Wt 184 lb 8 oz (83.689 kg)  SpO2 100% Wt Readings from Last 3 Encounters:  09/01/15 184 lb 8 oz (83.689 kg)  08/18/15 179 lb 6.4 oz (81.375 kg)  08/17/15 177 lb 4.8 oz (80.423 kg)

## 2015-09-01 NOTE — Progress Notes (Signed)
  Radiation Oncology         [phone]   Name: Michelle Duran MRN: [id number]   Date: 09/01/2015  DOB: [birth year]     Weekly Radiation Therapy Management    ICD-9-CM ICD-10-CM   1. Glioblastoma multiforme of brain (HCC) 191.9 C71.9     Current Dose: 8 Gy  Planned Dose:  44 Gy  Narrative The patient presents for routine under treatment assessment.  Weight and vitals stable. Denies pain. Reports an occasional "twing" at the surgical site. Denies taking Vicodin to manage pain but, does reports taking a Tylenol occasionally. Right parietal scalp surgical incision well approximated without redness, drainage or edema. No hyperpigmentation or desquamation of treatment area noted. Reports taking Decadron 2 mg bid. No thrush noted. Denies nausea or vomiting so long as she take Zofran thirty minutes before her temodar. Reports she has stopped smoking. Reports mild fatigue. Steady gait noted. Answers all questions quickly and appropriately with fluid speech.   The patient is without complaint. Set-up films were reviewed. The chart was checked.  Physical Findings  weight is 184 lb 8 oz (83.689 kg). Her blood pressure is 116/65 and her pulse is 78. Her respiration is 16 and oxygen saturation is 100%. . Weight essentially stable.  No significant changes.  Impression The patient is tolerating radiation.  Plan Continue treatment as planned. I recommend that she decreases her Decadron from 2 mg twice daily to 2 mg once daily.         Sheral Apley Tammi Klippel, M.D.  This document serves as a record of services personally performed by Tyler Pita, MD. It was created on his behalf by Lendon Collar, a trained medical scribe. The creation of this record is based on the scribe's personal observations and the provider's statements to them. This document has been checked and approved by the attending provider.

## 2015-09-05 ENCOUNTER — Ambulatory Visit (HOSPITAL_BASED_OUTPATIENT_CLINIC_OR_DEPARTMENT_OTHER): Payer: BC Managed Care – PPO | Admitting: Hematology and Oncology

## 2015-09-05 ENCOUNTER — Other Ambulatory Visit (HOSPITAL_BASED_OUTPATIENT_CLINIC_OR_DEPARTMENT_OTHER): Payer: BC Managed Care – PPO

## 2015-09-05 ENCOUNTER — Encounter: Payer: Self-pay | Admitting: Hematology and Oncology

## 2015-09-05 ENCOUNTER — Ambulatory Visit
Admission: RE | Admit: 2015-09-05 | Discharge: 2015-09-05 | Disposition: A | Discharge status: Home / Self Care | Payer: BC Managed Care – PPO | Source: Ambulatory Visit | Attending: Radiation Oncology | Admitting: Radiation Oncology

## 2015-09-05 ENCOUNTER — Telehealth: Payer: Self-pay | Admitting: Hematology and Oncology

## 2015-09-05 VITALS — BP 97/56 | HR 60 | Temp 98.2°F | Resp 18 | Ht 62.0 in | Wt 183.2 lb

## 2015-09-05 DIAGNOSIS — C712 Malignant neoplasm of temporal lobe: Secondary | ICD-10-CM

## 2015-09-05 DIAGNOSIS — C711 Malignant neoplasm of frontal lobe: Secondary | ICD-10-CM

## 2015-09-05 DIAGNOSIS — Z510 Encounter for antineoplastic radiation therapy: Secondary | ICD-10-CM | POA: Diagnosis not present

## 2015-09-05 DIAGNOSIS — C719 Malignant neoplasm of brain, unspecified: Secondary | ICD-10-CM

## 2015-09-05 LAB — CBC WITH DIFFERENTIAL/PLATELET
BASO%: 0.3 % (ref 0.0–2.0)
BASOS ABS: 0 10*3/uL (ref 0.0–0.1)
EOS%: 0.3 % (ref 0.0–7.0)
Eosinophils Absolute: 0 10*3/uL (ref 0.0–0.5)
HEMATOCRIT: 36 % (ref 34.8–46.6)
HEMOGLOBIN: 12.2 g/dL (ref 11.6–15.9)
LYMPH#: 1.3 10*3/uL (ref 0.9–3.3)
LYMPH%: 12.4 % — ABNORMAL LOW (ref 14.0–49.7)
MCH: 31.5 pg (ref 25.1–34.0)
MCHC: 34 g/dL (ref 31.5–36.0)
MCV: 92.7 fL (ref 79.5–101.0)
MONO#: 0.7 10*3/uL (ref 0.1–0.9)
MONO%: 6.3 % (ref 0.0–14.0)
NEUT#: 8.5 10*3/uL — ABNORMAL HIGH (ref 1.5–6.5)
NEUT%: 80.7 % — ABNORMAL HIGH (ref 38.4–76.8)
Platelets: 308 10*3/uL (ref 145–400)
RBC: 3.89 10*6/uL (ref 3.70–5.45)
RDW: 14.4 % (ref 11.2–14.5)
WBC: 10.5 10*3/uL — ABNORMAL HIGH (ref 3.9–10.3)

## 2015-09-05 LAB — COMPREHENSIVE METABOLIC PANEL
ALBUMIN: 3.1 g/dL — AB (ref 3.5–5.0)
ALK PHOS: 64 U/L (ref 40–150)
ALT: 33 U/L (ref 0–55)
AST: 19 U/L (ref 5–34)
Anion Gap: 9 mEq/L (ref 3–11)
BUN: 24.1 mg/dL (ref 7.0–26.0)
CALCIUM: 8.4 mg/dL (ref 8.4–10.4)
CO2: 23 mEq/L (ref 22–29)
CREATININE: 0.9 mg/dL (ref 0.6–1.1)
Chloride: 111 mEq/L — ABNORMAL HIGH (ref 98–109)
EGFR: 70 mL/min/{1.73_m2} — ABNORMAL LOW (ref >90)
Glucose: 118 mg/dl (ref 70–140)
Potassium: 4.1 mEq/L (ref 3.5–5.1)
Sodium: 142 mEq/L (ref 136–145)
TOTAL PROTEIN: 5.6 g/dL — AB (ref 6.4–8.3)
Total Bilirubin: 0.34 mg/dL (ref 0.20–1.20)

## 2015-09-05 MED ORDER — SONAFINE EX EMUL
1.0000 "application " | Freq: Two times a day (BID) | CUTANEOUS | Status: AC
  Administered 2015-09-05: 1 via TOPICAL

## 2015-09-05 NOTE — Assessment & Plan Note (Signed)
Brain MRI 07/24/2015: Heterogeneous enhancing right frontal lobe mass 4.7 x 3.6 x 3.3 cm with vasogenic edema, heterogeneous enhancing mid right temporal lobe mass 4.1 x 3.1 x 2.2 cm with edema and right-to-left shift and mass effect. 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 and MGMT Methylation: Not Detected) Suggests Poorer response to chemo-XRT and Poor prognosis  Current treatment:Concurrent chemoradiation with daily temozolomide at 75 mg/m (180 mg dose) followed by maintenance Temodar 5 days every month (first month 150 mg/m, second month onwards 200 mg/m) X 12 months  Temozolomide toxicities:  Return to clinic in 2 weeks for toxicity check and follow-up labs

## 2015-09-05 NOTE — Addendum Note (Signed)
Encounter addended by: Heywood Footman, RN on: 09/05/2015  3:17 PM<BR>     Documentation filed: Orders, [employer], Inpatient [facility]

## 2015-09-05 NOTE — Addendum Note (Signed)
Encounter addended by: Heywood Footman, RN on: 09/05/2015  4:15 PM<BR>     Documentation filed: Notes Section

## 2015-09-05 NOTE — Progress Notes (Signed)
Patient Care Team: Carol Ada, MD as PCP - General (Family Medicine)  DIAGNOSIS: No matching staging information was found for the patient.  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 She had nausea  Which improved after taking Zofran. She appears to be tolerating the treatment fairly well.  REVIEW OF SYSTEMS:   Constitutional: Denies fevers, chills or abnormal weight loss Eyes: Denies blurriness of vision Ears, nose, mouth, throat, and face: Denies mucositis or sore throat Respiratory: Denies cough, dyspnea or wheezes Cardiovascular: Denies palpitation, chest discomfort Gastrointestinal:  Denies nausea, heartburn or change in bowel habits Skin: Denies abnormal skin rashes Lymphatics: Denies new lymphadenopathy or easy bruising Neurological:Denies numbness, tingling or new weaknesses Behavioral/Psych: Mood is stable, no new changes  Extremities: No lower extremity edema All other systems were reviewed with the patient and are negative.  I have reviewed the past medical history, past surgical history, social history and family history with the patient and they are unchanged from previous note.  ALLERGIES:  is allergic to codeine; prednisone; and sulfonamide derivatives.  MEDICATIONS:    Current Outpatient Prescriptions  Medication Sig Dispense Refill  . ACCU-CHEK AVIVA PLUS test strip USE TO CHECK BLOOD SUGAR 4 TIMES A DAY BEFORE MEALS & AT BEDTIME (ICD-9 2500.00,250.01)  1  . acetaminophen (TYLENOL) 650 MG CR tablet Take 1,300 mg by mouth every 8 (eight) hours as needed for pain.    Marland Kitchen ALPRAZolam (XANAX) 0.5 MG tablet Take 0.25 mg by mouth 2 (two) times daily.    Marland Kitchen dexamethasone (DECADRON) 4 MG tablet Take 1 tablet (4 mg total) by mouth 2 (two) times daily. Taper dose down from current dose by taking 4 mg three times daily for three days then 4 mg twice a day and stay on that dose until you return to clinic 60 tablet 0  . Insulin Glargine (LANTUS) 100 UNIT/ML Solostar Pen Inject 20 Units into the skin daily at 10 pm. 15 mL 11  . lactose free nutrition (BOOST PLUS) LIQD Take 237 mLs by mouth daily. 30 Can 0  . levETIRAcetam (KEPPRA) 500 MG tablet Take 500 mg by mouth 2 (two) times daily.  0  . levETIRAcetam (KEPPRA) 750 MG tablet Take 1 tablet (750 mg total) by mouth 2 (two) times daily. (Patient not taking: Reported on 09/01/2015) 30 tablet 0  . levothyroxine (SYNTHROID, LEVOTHROID) 50 MCG tablet Take 25 mcg by mouth daily.    Marland Kitchen lisinopril (PRINIVIL,ZESTRIL) 5 MG tablet Take 5 mg by mouth every evening.    . methocarbamol (ROBAXIN) 500 MG tablet     . metoprolol succinate (TOPROL-XL) 25 MG 24 hr tablet Take 12.5 mg by mouth every evening.    . nicotine (NICODERM CQ - DOSED IN MG/24 HOURS) 21 mg/24hr patch Place 1 patch (21 mg total) onto the skin daily. 28 patch 0  .  ondansetron (ZOFRAN) 4 MG tablet     . pantoprazole (PROTONIX) 40 MG tablet Take 1 tablet (40 mg total) by mouth daily. Switch for any other PPI at similar dose and frequency 10 tablet 0  . PRILOSEC OTC 20 MG tablet Take 40 mg by mouth every morning. Reported on 09/01/2015  3  . sertraline (ZOLOFT) 50 MG tablet Take 3 tablets (150 mg total) by mouth every evening. 90 tablet 0  . temozolomide (TEMODAR) 180 MG  capsule Take 1 capsule (180 mg total) by mouth daily. May take on an empty stomach or at bedtime to decrease nausea & vomiting. 72 capsule 0   No current facility-administered medications for this visit.    PHYSICAL EXAMINATION: ECOG PERFORMANCE STATUS: 1 - Symptomatic but completely ambulatory  Filed Vitals:   09/05/15 1056  BP: 97/56  Pulse: 60  Temp: 98.2 F (36.8 C)  Resp: 18   Filed Weights   09/05/15 1056  Weight: 183 lb 3.2 oz (83.099 kg)    GENERAL:alert, no distress and comfortable SKIN: skin color, texture, turgor are normal, no rashes or significant lesions EYES: normal, Conjunctiva are pink and non-injected, sclera clear OROPHARYNX:no exudate, no erythema and lips, buccal mucosa, and tongue normal  NECK: supple, thyroid normal size, non-tender, without nodularity LYMPH:  no palpable lymphadenopathy in the cervical, axillary or inguinal LUNGS: clear to auscultation and percussion with normal breathing effort HEART: regular rate & rhythm and no murmurs and no lower extremity edema ABDOMEN:abdomen soft, non-tender and normal bowel sounds MUSCULOSKELETAL:no cyanosis of digits and no clubbing  NEURO: alert & oriented x 3 with fluent speech, no focal motor/sensory deficits EXTREMITIES: No lower extremity edema   LABORATORY DATA:  I have reviewed the data as listed   Chemistry      Component Value Date/Time   NA 135 08/10/2015 0322   K 4.6 08/10/2015 0322   CL 102 08/10/2015 0322   CO2 26 08/10/2015 0322   BUN 17 08/10/2015 0322   CREATININE 0.91 08/10/2015 0322      Component Value Date/Time   CALCIUM 8.6* 08/10/2015 0322   ALKPHOS 112 07/24/2015 1530   AST 21 07/24/2015 1530   ALT 22 07/24/2015 1530   BILITOT 0.2* 07/24/2015 1530       Lab Results  Component Value Date   WBC 10.5* 09/05/2015   HGB 12.2 09/05/2015   HCT 36.0 09/05/2015   MCV 92.7 09/05/2015   PLT 308 09/05/2015   NEUTROABS 8.5* 09/05/2015   ASSESSMENT & PLAN:  Glioblastoma  multiforme of brain (H[facility]) Brain MRI 07/24/2015: Heterogeneous enhancing right frontal lobe mass 4.7 x 3.6 x 3.3 cm with vasogenic edema, heterogeneous enhancing mid right temporal lobe mass 4.1 x 3.1 x 2.2 cm with edema and right-to-left shift and mass effect. 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  Nausea which improved after taking Zofran 2.  Decreased taste  Return to clinic in 3 weeks for toxicity check and follow-up labs   No orders of the defined types were placed in this encounter.   The patient has a good understanding of the overall plan. she agrees  with it. she will call with any problems that may develop before the next visit here.   Rulon Eisenmenger, MD 09/05/2015

## 2015-09-05 NOTE — Telephone Encounter (Signed)
Appointments made and avs printed for patient °

## 2015-09-05 NOTE — Addendum Note (Signed)
Encounter addended by: Wynona Neat, RPH on: 09/05/2015  3:24 PM<BR>     Documentation filed: Rx Order Verification

## 2015-09-06 ENCOUNTER — Ambulatory Visit
Admission: RE | Admit: 2015-09-06 | Discharge: 2015-09-06 | Disposition: A | Discharge status: Home / Self Care | Payer: BC Managed Care – PPO | Source: Ambulatory Visit | Attending: Radiation Oncology | Admitting: Radiation Oncology

## 2015-09-06 DIAGNOSIS — Z510 Encounter for antineoplastic radiation therapy: Secondary | ICD-10-CM | POA: Diagnosis not present

## 2015-09-07 ENCOUNTER — Encounter: Payer: Self-pay | Admitting: Radiation Oncology

## 2015-09-07 ENCOUNTER — Ambulatory Visit
Admission: RE | Admit: 2015-09-07 | Discharge: 2015-09-07 | Disposition: A | Discharge status: Home / Self Care | Payer: BC Managed Care – PPO | Source: Ambulatory Visit | Attending: Radiation Oncology | Admitting: Radiation Oncology

## 2015-09-07 ENCOUNTER — Telehealth: Payer: Self-pay | Admitting: Pharmacist

## 2015-09-07 DIAGNOSIS — Z510 Encounter for antineoplastic radiation therapy: Secondary | ICD-10-CM | POA: Diagnosis not present

## 2015-09-07 NOTE — Telephone Encounter (Signed)
09/07/15: Attempted to call patient regarding new oral medication: Temodar. No Answer. Left VM for patient to call back with any questions or concerns.   Thank you,  Montel Clock, PharmD, [facility] [phone]

## 2015-09-07 NOTE — Progress Notes (Signed)
Disability paperwork received given to RN 09/06/14 Ardeen Fillers)

## 2015-09-08 ENCOUNTER — Other Ambulatory Visit: Payer: Self-pay | Admitting: Hematology and Oncology

## 2015-09-08 ENCOUNTER — Encounter: Payer: Self-pay | Admitting: Radiation Oncology

## 2015-09-08 ENCOUNTER — Encounter: Payer: Self-pay | Admitting: Pharmacist

## 2015-09-08 ENCOUNTER — Ambulatory Visit
Admission: RE | Admit: 2015-09-08 | Discharge: 2015-09-08 | Disposition: A | Discharge status: Home / Self Care | Payer: BC Managed Care – PPO | Source: Ambulatory Visit | Attending: Radiation Oncology | Admitting: Radiation Oncology

## 2015-09-08 ENCOUNTER — Telehealth: Payer: Self-pay | Admitting: Radiation Oncology

## 2015-09-08 VITALS — BP 111/51 | HR 55 | Resp 16 | Wt 184.7 lb

## 2015-09-08 DIAGNOSIS — C719 Malignant neoplasm of brain, unspecified: Secondary | ICD-10-CM

## 2015-09-08 DIAGNOSIS — Z510 Encounter for antineoplastic radiation therapy: Secondary | ICD-10-CM | POA: Diagnosis not present

## 2015-09-08 MED ORDER — AMOXICILLIN-POT CLAVULANATE 875-125 MG PO TABS: 1.0000 | ORAL_TABLET | Freq: Two times a day (BID) | ORAL | Status: DC

## 2015-09-08 MED ORDER — DAPSONE 100 MG PO TABS: 100.0000 mg | ORAL_TABLET | Freq: Every day | ORAL | Status: DC

## 2015-09-08 NOTE — Telephone Encounter (Signed)
Phoned patient making her aware that her antibiotics have been escribed and should be ready for pick up at her pharmacy in an hour. Patient verbalized understanding.

## 2015-09-08 NOTE — Progress Notes (Signed)
Oral Chemotherapy Pharmacist Encounter   I spoke with patient for overview of new oral chemotherapy medication: Temoda. It was difficult to get in touch with patient over the last week. Pt is doing well. She did have some nausea and vomiting when she first started Temodar on 08/29/15. This has improved since starting zofran. Temodar is filled through [facility] patient on administration, dosing, side effects, safe handling, and monitoring. Side effects include but not limited to: fatigue, nausea/vomiting, infections, headache, myelosuppresson.  Ms. Whitby voiced understanding and appreciation. [facility] dapsone [facility] while on temodar and radiation concurrent therapy. Ms. Kovacevic will pick up medication.   All questions answered.  Will follow up in 1-2 weeks for adherence and toxicity management.   Thank you,  Montel Clock, PharmD, [facility]

## 2015-09-08 NOTE — Progress Notes (Signed)
Weight and vitals stable. Denies pain. Steady gait noted. Reports taking decadron 2 mg once daily. No thrush noted. Concerned about abscessed tooth. Reports new onset taste changes. Denies headache or dizziness. Denies nausea or vomiting so long as she takes zofran thirty minutes prior to Temodar. Reports her short term memory is poor but, no worse.   BP 111/51 mmHg  Pulse 55  Resp 16  Wt 184 lb 11.2 oz (83.779 kg)  SpO2 100% Wt Readings from Last 3 Encounters:  09/08/15 184 lb 11.2 oz (83.779 kg)  09/05/15 183 lb 3.2 oz (83.099 kg)  09/01/15 184 lb 8 oz (83.689 kg)

## 2015-09-08 NOTE — Progress Notes (Signed)
  Radiation Oncology         [phone]   Name: Michelle Duran MRN: [id number]   Date: 09/08/2015  DOB: 06-13-1956     Weekly Radiation Therapy Management    ICD-9-CM ICD-10-CM   1. Glioblastoma multiforme of brain (HCC) 191.9 C71.9     Current Dose: 16 Gy  Planned Dose:  44 Gy  Narrative The patient presents for routine under treatment assessment.  Weight and vitals stable. Denies pain. Steady gait noted. Reports taking decadron 2 mg once daily. No thrush noted. Concerned about abscessed tooth that is painful to the touch. Reports new onset taste changes. Denies headache or dizziness. Denies nausea or vomiting so long as she takes zofran thirty minutes prior to Temodar. Reports her short term memory is poor but, no worse. Reports her right eye was swollen the other day and called her doctor who was not concerned believing it to be surgery related.   The patient is without complaint. Set-up films were reviewed. The chart was checked.  Physical Findings  weight is 184 lb 11.2 oz (83.779 kg). Her blood pressure is 111/51 and her pulse is 55. Her respiration is 16 and oxygen saturation is 100%. . Weight essentially stable.  No significant changes. No thrush noted.   Impression The patient is tolerating radiation.  Plan Continue treatment as planned. I have prescribed the patient Augmentin for her abscessed tooth. Advised for the patient to discontinue her current steroids.         Sheral Apley Tammi Klippel, M.D.  This document serves as a record of services personally performed by Tyler Pita, MD. It was created on his behalf by Arlyce Harman, a trained medical scribe. The creation of this record is based on the scribe's personal observations and the provider's statements to them. This document has been checked and approved by the attending provider.

## 2015-09-11 ENCOUNTER — Ambulatory Visit
Admission: RE | Admit: 2015-09-11 | Discharge: 2015-09-11 | Disposition: A | Discharge status: Home / Self Care | Payer: BC Managed Care – PPO | Source: Ambulatory Visit | Attending: Radiation Oncology | Admitting: Radiation Oncology

## 2015-09-11 ENCOUNTER — Telehealth: Payer: Self-pay | Admitting: Pharmacist

## 2015-09-11 ENCOUNTER — Telehealth: Payer: Self-pay | Admitting: Radiation Oncology

## 2015-09-11 ENCOUNTER — Ambulatory Visit: Payer: BC Managed Care – PPO

## 2015-09-11 NOTE — Telephone Encounter (Signed)
Patient phoned questioning if the augmentin Dr. Tammi Klippel prescribed for her abscessed tooth could be causing her nausea. Patient understands this RN will forward this question onto Dr. Tammi Klippel and phone back with his response.

## 2015-09-11 NOTE — Telephone Encounter (Signed)
09/11/15: Attempted to reach patient for follow up on oral medication: Temodar. No answer. Left VM for patient to call back with any questions or issues.   Thank you,  Montel Clock, PharmD, [facility] [phone]

## 2015-09-11 NOTE — Telephone Encounter (Signed)
Working on [employer] paperwork for patient. Phoned patient to ask questions about paperwork. No answer. Left message requesting return call.

## 2015-09-11 NOTE — Telephone Encounter (Signed)
Patient phoned to inquire if she should still plan to present for radiation therapy at 1100 today despite not being able to take her Temodar last night due to nausea. Phoned patient make after conversing with Dr. Tammi Klippel. Encouraged patient to present for treatment if safe (snow) to do so despite not taking her Temodar. Patient confirms she has phenergan and zofran on hand to manage her nausea. Patient explains her mother (age 59) drives her to her appointments and she doesn't feel like it would be safe to ask her to get out in this weather. 1100 treatment appointment for today cancelled. Informed L4 of these findings and Dr. Tammi Klippel.

## 2015-09-11 NOTE — Telephone Encounter (Signed)
Probably not augmentin, but, it is possible

## 2015-09-12 ENCOUNTER — Ambulatory Visit
Admission: RE | Admit: 2015-09-12 | Discharge: 2015-09-12 | Disposition: A | Discharge status: Home / Self Care | Payer: BC Managed Care – PPO | Source: Ambulatory Visit | Attending: Radiation Oncology | Admitting: Radiation Oncology

## 2015-09-12 DIAGNOSIS — Z510 Encounter for antineoplastic radiation therapy: Secondary | ICD-10-CM | POA: Diagnosis not present

## 2015-09-13 ENCOUNTER — Ambulatory Visit
Admission: RE | Admit: 2015-09-13 | Discharge: 2015-09-13 | Disposition: A | Discharge status: Home / Self Care | Payer: BC Managed Care – PPO | Source: Ambulatory Visit | Attending: Radiation Oncology | Admitting: Radiation Oncology

## 2015-09-13 ENCOUNTER — Other Ambulatory Visit: Payer: Self-pay | Admitting: Radiation Oncology

## 2015-09-13 ENCOUNTER — Telehealth: Payer: Self-pay | Admitting: Radiation Oncology

## 2015-09-13 DIAGNOSIS — C719 Malignant neoplasm of brain, unspecified: Secondary | ICD-10-CM

## 2015-09-13 DIAGNOSIS — Z510 Encounter for antineoplastic radiation therapy: Secondary | ICD-10-CM | POA: Diagnosis not present

## 2015-09-13 MED ORDER — LEVETIRACETAM 500 MG PO TABS: 500.0000 mg | ORAL_TABLET | Freq: Two times a day (BID) | ORAL | Status: DC

## 2015-09-13 NOTE — Telephone Encounter (Signed)
-----   Message from Tyler Pita, MD sent at 09/13/2015  3:34 PM EST ----- Regarding: RE: Medication refill request Done for 30 days with 5 refills.   ----- Message -----    From: Heywood Footman, RN    Sent: 09/13/2015  11:56 AM      To: Tyler Pita, MD, # Subject: Medication refill request                      Dr. Tammi Klippel.   Michelle Duran is requesting a refill of her Keppra. She takes Keppra 500 mg bid. [facility] [address] She is completely out.   Sam

## 2015-09-13 NOTE — Progress Notes (Signed)
Patient presented to nursing requesting a refill of her Keppra. Forwarded request to Dr. Tammi Klippel. Additionally, patient reports decreased appetite due to taste changes. Provided patient a handout reference managing taste changes and reviewed pertinent information. Also, arrange appointment for patient with Ernestene Kiel. Patient verbalized understanding of all reviewed. No signs or symptoms of dehydration reported and no weight loss noted.

## 2015-09-13 NOTE — Telephone Encounter (Signed)
Phoned patient making her aware her [facility]pp[facility] ref[facility] [address]. Patient verbalized understanding and expressed appreciation for the call.

## 2015-09-14 ENCOUNTER — Ambulatory Visit
Admission: RE | Admit: 2015-09-14 | Discharge: 2015-09-14 | Disposition: A | Discharge status: Home / Self Care | Payer: BC Managed Care – PPO | Source: Ambulatory Visit | Attending: Radiation Oncology | Admitting: Radiation Oncology

## 2015-09-14 DIAGNOSIS — Z510 Encounter for antineoplastic radiation therapy: Secondary | ICD-10-CM | POA: Diagnosis not present

## 2015-09-15 ENCOUNTER — Encounter: Payer: Self-pay | Admitting: Radiation Oncology

## 2015-09-15 ENCOUNTER — Ambulatory Visit
Admission: RE | Admit: 2015-09-15 | Discharge: 2015-09-15 | Disposition: A | Discharge status: Home / Self Care | Payer: BC Managed Care – PPO | Source: Ambulatory Visit | Attending: Radiation Oncology | Admitting: Radiation Oncology

## 2015-09-15 ENCOUNTER — Telehealth: Payer: Self-pay | Admitting: Radiation Oncology

## 2015-09-15 VITALS — BP 104/67 | HR 61 | Resp 16 | Wt 179.5 lb

## 2015-09-15 DIAGNOSIS — Z510 Encounter for antineoplastic radiation therapy: Secondary | ICD-10-CM | POA: Diagnosis not present

## 2015-09-15 DIAGNOSIS — C719 Malignant neoplasm of brain, unspecified: Secondary | ICD-10-CM

## 2015-09-15 NOTE — Progress Notes (Signed)
Vitals stable. Weight loss noted. Denies pain. Patient reports she tapered off decadron as directed by Dr. Lindi Adie. Reports she continues to take Keppra bid. Reports fatigue. Denies headaches. Reports taste changes. Reports difficulty finding something to eat that she enjoyed that doesn't cause her to feel nauseated. Consult with Ernestene Kiel, RD has been arranged. Reports tingling at her surgical site but, denies pain. Surgical site without redness, drainage or edema. Steady gait noted. Does report she feels as though the right side of her face is "puffy."  BP 104/67 mmHg  Pulse 61  Resp 16  Wt 179 lb 8 oz (81.421 kg)  SpO2 100% Wt Readings from Last 3 Encounters:  09/15/15 179 lb 8 oz (81.421 kg)  09/13/15 184 lb (83.462 kg)  09/08/15 184 lb 11.2 oz (83.779 kg)

## 2015-09-15 NOTE — Telephone Encounter (Signed)
Placed orange folder with complete FMLA paperwork in Dr. Manning's inbox to sign.  

## 2015-09-15 NOTE — Progress Notes (Signed)
  Radiation Oncology         [phone]   Name: Michelle Duran MRN: [id number]   Date: 09/15/2015  DOB: 10-26-1955     Weekly Radiation Therapy Management    ICD-9-CM ICD-10-CM   1. Glioblastoma multiforme of brain (HCC) 191.9 C71.9     Current Dose: 24 Gy  Planned Dose:  44 Gy  Narrative The patient presents for routine under treatment assessment.  Vitals stable. Weight loss noted. Denies pain. Patient reports she tapered off decadron as directed by Dr. Lindi Adie. Reports she continues to take Keppra bid. Reports fatigue. Denies headaches. Reports taste changes. Reports difficulty finding something to eat that she enjoyed that doesn't cause her to feel nauseated. Consult with Ernestene Kiel, RD has been arranged. Reports tingling at her surgical site but, denies pain. Surgical site without redness, drainage or edema. Steady gait noted. Does report she feels as though the right side of her face is "puffy."  Previous nausea and vomiting with chemotherapy. Now managed with Zofran and Fenegrin. Previous Decidron led to high blood sugar and neccesitated use of insulin. Patient was curious if now discontinued decadron means that she should also discontinue insulin.She states recent blood sugar levels as 114, 97, 112, and 122. Reports scar on head: "funny pains" in it with no drainage and some scabbing. Reports blurry vision in right eye and cell phone light bothers her vision. Reports swollen right side of face. Reports longer time to urinate. States that she drinks "a ton" of water.   Set-up films were reviewed. The chart was checked.  Physical Findings  weight is 179 lb 8 oz (81.421 kg). Her blood pressure is 104/67 and her pulse is 61. Her respiration is 16 and oxygen saturation is 100%.  Weight essentially stable.  No significant changes. No drainage or redness of craniotomy scar. Scabbing was noted. Patient's craniotomy incision is well healed.   Impression The patient is tolerating radiation.    Plan Continue treatment as planned. Advised for the patient to continue insulin and to follow up with Dr. Lindi Adie for blood sugar management. Recommended that the patient use head and shoulders shampoo and conditioner to moisturize scalp.         Sheral Apley Tammi Klippel, M.D.  This document serves as a record of services personally performed by Tyler Pita, MD. It was created on his behalf by Jenell Milliner, a trained medical scribe. The creation of this record is based on the scribe's personal observations and the provider's statements to them. This document has been checked and approved by the attending provider.

## 2015-09-18 ENCOUNTER — Ambulatory Visit
Admission: RE | Admit: 2015-09-18 | Discharge: 2015-09-18 | Disposition: A | Discharge status: Home / Self Care | Payer: BC Managed Care – PPO | Source: Ambulatory Visit | Attending: Radiation Oncology | Admitting: Radiation Oncology

## 2015-09-18 DIAGNOSIS — R110 Nausea: Secondary | ICD-10-CM

## 2015-09-18 DIAGNOSIS — Z510 Encounter for antineoplastic radiation therapy: Secondary | ICD-10-CM | POA: Diagnosis not present

## 2015-09-18 DIAGNOSIS — C719 Malignant neoplasm of brain, unspecified: Secondary | ICD-10-CM

## 2015-09-18 NOTE — Progress Notes (Signed)
BP low. Denies pain. Presented to nursing reports nausea. Reports she continues to take Zofran thirty minutes prior to Temodar. Reports feelings of nausea have been persistently present since tapering off decadron. While working patient up for nausea it was noted her BP is extremely low. Reports her medical oncologist instructed her to back off on her lisinopril and metoprolol but, "didn't say for how long" so she resume her normal regimen a few days ago. Denies feeling lightheaded upon standing. Scheduled to see nutritionist tomorrow.   BP 84/64 mmHg  Pulse 51  Temp(Src) 97.7 F (36.5 C) (Oral)  Resp 16  SpO2 100% Wt Readings from Last 3 Encounters:  09/15/15 179 lb 8 oz (81.421 kg)  09/13/15 184 lb (83.462 kg)  09/08/15 184 lb 11.2 oz (83.779 kg)

## 2015-09-18 NOTE — Progress Notes (Signed)
  Radiation Oncology         [phone]   Name: Zahirah D Juday MRN: [id number]   Date: 09/18/2015  DOB: Emmalea 23, 1957   Weekly Radiation Therapy Management  No diagnosis found.  Current Dose: 26.6 Gy  Planned Dose:  60 Gy  Narrative The patient presents for a working visit for nausea. The patient was last seen in our office on 09/15/2015 foreign undertreat assessment and was doing quite well. She was interested in with nutrition which she has an appointment scheduled for tomorrow. When we last saw her, we discussed tapering her Decadron completely, and she states that since doing that she has been experiencing nausea. She continues to take Zofran 30 minutes prior to taking Temodar and is also using Phenergan without complete relief. She denies any emesis but states that she is constantly spinning saliva gland has been unable to find anything else that suits her taste. She denies any emesis, abdominal pain, fevers or chills. No other complaints or verbalized.   Physical Findings  vitals were not taken for this visit.  This is a non-toxic-appearing Caucasian female in no acute distress she is alert and oriented 4 and appropriate during the examination.   Impression The patient is tolerating radiation but is experiencing nausea since discontinuation of steroids.   Plan Continue treatment as planned but will add back Dexamethasone 2mg  BID. She will continue to use Zofran and Phenergan intermittently for relief, and her Zofran prior to her Temodar. I encouraged her to stop by in the next few days if her symptoms are not improving, and we will see her for her next under treat visit towards the end of the week and will follow up on her symptoms.    The above documentation reflects my direct findings during this shared patient visit. Please see the separate note by Dr. Tammi Klippel on this date for the remainder of the patient's plan of care.  Carola Rhine, PAC

## 2015-09-18 NOTE — Patient Instructions (Signed)
Contact our office if you have any questions following today's appointment: [phone].  

## 2015-09-19 ENCOUNTER — Ambulatory Visit
Admission: RE | Admit: 2015-09-19 | Discharge: 2015-09-19 | Disposition: A | Discharge status: Home / Self Care | Payer: BC Managed Care – PPO | Source: Ambulatory Visit | Attending: Radiation Oncology | Admitting: Radiation Oncology

## 2015-09-19 ENCOUNTER — Ambulatory Visit: Payer: BC Managed Care – PPO | Admitting: Nutrition

## 2015-09-19 ENCOUNTER — Encounter: Payer: Self-pay | Admitting: Radiation Oncology

## 2015-09-19 DIAGNOSIS — Z510 Encounter for antineoplastic radiation therapy: Secondary | ICD-10-CM | POA: Diagnosis not present

## 2015-09-19 NOTE — Progress Notes (Signed)
60 year old female diagnosed with glioblastoma.  She is a patient of Dr. Sonny Dandy.  Past medical history includes gallstones, hypertension, hypothyroidism, anxiety, vitamin D deficiency, diabetes, tobacco, GERD.  Medications include Xanax, Lantus, Synthroid, Zofran, Protonix, Prilosec, Phenergan, Zoloft, and Temodar.  Labs include albumin 3.1.  Height: 62 inches. Weight: 179.5 pounds January 13. Usual body weight: 187 pounds November 2016. BMI: 32.82.  Patient reports she has nausea which is ongoing on a daily basis.  She takes nausea medicine but typically once daily. She complains of taste alterations. Smell of food makes her nauseous. She is eating very small amounts of food. She no longer tolerates oral nutrition supplements.  Nutrition diagnosis: Unintended weight loss related to nausea and taste alterations as evidenced by 7.5 pound weight loss over 2 months.  Intervention: I educated patient to consume small frequent meals and snacks utilizing high protein foods. Reviewed foods which are appropriate when patient feeling nauseated. Encouraged patient to take nausea medication as prescribed and discuss with physician if this does not improve. Reviewed strategies for improving taste alterations. Provided fact sheets on increasing calories and protein, taste alterations, and nausea and vomiting. Questions were answered.  Teach back method used.  Contact information was given.  Monitoring, evaluation, goals: Patient will tolerate adequate calories and protein to promote weight maintenance and improve oral intake.  Next visit:patient will contact me by phone as needed for questions.  **Disclaimer: This note was dictated with voice recognition software. Similar sounding words can inadvertently be transcribed and this note may contain transcription errors which may not have been corrected upon publication of note.**

## 2015-09-19 NOTE — Progress Notes (Signed)
Paperwork received from doctor, faxed to [facility] @ [phone], confirmation received, copy given to patient, 09/19/15 Ardeen Fillers)

## 2015-09-20 ENCOUNTER — Other Ambulatory Visit: Payer: Self-pay | Admitting: Radiation Oncology

## 2015-09-20 ENCOUNTER — Telehealth: Payer: Self-pay | Admitting: Radiation Oncology

## 2015-09-20 ENCOUNTER — Ambulatory Visit
Admission: RE | Admit: 2015-09-20 | Discharge: 2015-09-20 | Disposition: A | Discharge status: Home / Self Care | Payer: BC Managed Care – PPO | Source: Ambulatory Visit | Attending: Radiation Oncology | Admitting: Radiation Oncology

## 2015-09-20 DIAGNOSIS — K219 Gastro-esophageal reflux disease without esophagitis: Secondary | ICD-10-CM

## 2015-09-20 DIAGNOSIS — Z510 Encounter for antineoplastic radiation therapy: Secondary | ICD-10-CM | POA: Diagnosis not present

## 2015-09-20 MED ORDER — PANTOPRAZOLE SODIUM 40 MG PO TBEC: 40.0000 mg | DELAYED_RELEASE_TABLET | Freq: Every day | ORAL | Status: DC

## 2015-09-20 MED ORDER — ONDANSETRON HCL 8 MG PO TABS: 8.0000 mg | ORAL_TABLET | Freq: Three times a day (TID) | ORAL | Status: DC

## 2015-09-20 NOTE — Progress Notes (Addendum)
The patient was seen today briefly due to questions of needing refills on her Zofran. She states that she met with nutrition yesterday and is going to try and take her antibiotics and is scheduled fashion. She would like to start taking her Zofran 8 milligrams every 8 hours and her Phenergan 25 mg every 4-6 hours. I suggested that she schedule her Phenergan at 6 hour increments. She is to return for an undertreat visit on Friday of this week, and we discussed that if her symptoms of nausea has not improved by that time with this strategy, in addition to the steroids which she has yet to start, that we would consider the addition of Ativan.  She also has edema beneath her right eye and states that she's not had any trauma. The posterior aspect of her lower eyelid does not have any visible abnormalities. No edema or cellulitic appearing changes are noted. This seems to be unrelated to her current medication or radiation therapy but we will continue to follow this. She is in agreement. [facility] She's encouraged to call back if she has questions or concerns that arise prior to her visit.

## 2015-09-20 NOTE — Progress Notes (Signed)
Presented to nursing requesting to be evaluated for new onset puffiness under right eye and refill of Zofran. BP low. Patient denies taking lisinopril this morning but, confirms she did take metoprolol. Explains she hasn't began taking decadron again as direct but, she plans to once she picks up her protonix today. Explains that most mornings when she wakes up her right eye is puffy but, the puffiness quickly resolves without intervention however, today it didn't. Patient denies diplopia, floaters, or blurry vision.   BP 98/43 mmHg  Pulse 53  Resp 16  Wt 179 lb (81.194 kg)  SpO2 100% Wt Readings from Last 3 Encounters:  09/20/15 179 lb (81.194 kg)  09/15/15 179 lb 8 oz (81.421 kg)  09/13/15 184 lb (83.462 kg)

## 2015-09-20 NOTE — Telephone Encounter (Addendum)
Phoned patient making her aware that Protonix script has been escribed. Questioned if patient continues to have nausea. Patient reports the nausea persist however, she has begun to take the decadron yet because she didn't have any protonix. Patient reports her plan to pick up protonix and begin taking decadron today.

## 2015-09-20 NOTE — Telephone Encounter (Signed)
Received call from patient that pharmacy did not receive protonix script. Epic indicated script was printed and no escribed. Per Shona Simpson, PA's order called in Protonix 40 mg, one tablet po daily, qty 30 [facility] [address]. Phoned patient back making her aware this was done and advising her to wait one hour prior to going to pick it up.

## 2015-09-20 NOTE — Progress Notes (Signed)
Reports nausea continues. No redness of right eye noted. Patient denies pain in her right eye. No discharge noted from right eye.

## 2015-09-21 ENCOUNTER — Ambulatory Visit: Payer: BC Managed Care – PPO | Admitting: Occupational Therapy

## 2015-09-21 ENCOUNTER — Ambulatory Visit
Admission: RE | Admit: 2015-09-21 | Discharge: 2015-09-21 | Disposition: A | Discharge status: Home / Self Care | Payer: BC Managed Care – PPO | Source: Ambulatory Visit | Attending: Radiation Oncology | Admitting: Radiation Oncology

## 2015-09-21 VITALS — BP 99/62 | HR 53 | Resp 16 | Wt 174.4 lb

## 2015-09-21 DIAGNOSIS — Z510 Encounter for antineoplastic radiation therapy: Secondary | ICD-10-CM | POA: Diagnosis not present

## 2015-09-21 DIAGNOSIS — C719 Malignant neoplasm of brain, unspecified: Secondary | ICD-10-CM

## 2015-09-21 NOTE — Progress Notes (Signed)
  Radiation Oncology         [id number]   Name: Michelle Duran MRN: [id number]   Date: 09/21/2015  DOB: 12-20-1955   Weekly Radiation Therapy Management    ICD-9-CM ICD-10-CM   1. Glioblastoma multiforme of brain (HCC) 191.9 C71.9     Current Dose: 32Gy  Planned Dose:  60Gy  Narrative The patient presents for routine under treatment assessment.  BP low. Weight loss noted. Started back taking decadron 2 mg bid and protonix just today. Reports nausea continues and lack of desire to eat. Patient voices she hopes poor appetite will improve now that she is back on steroids. Reports diarrhea last night but, relates this to effects of antibiotics she completed for abscessed tooth. Denies headache but, does reports intermittent twinges at surgical site. Reports excess saliva is annoying because she must frequently spit (60-70 x per day) to clear this. Denies dizziness, diplopia or ringing in the ears. Denies confusion. Answers all questions quickly and appropriately.  Set-up films were reviewed. The chart was checked.  Physical Findings  weight is 174 lb 6.4 oz (79.107 kg). Her blood pressure is 99/62 and her pulse is 53. Her respiration is 16 and oxygen saturation is 100%.  Weight essentially stable. No significant changes.  Impression The patient is tolerating radiation.  Plan Continue treatment as planned. Discontinue Lisinopril and Metoprolol      Onnika Siebel A. Tammi Klippel, M.D.  This document serves as a record of services personally performed by Tyler Pita, MD. It was created on his behalf by Jenell Milliner, a trained medical scribe. The creation of this record is based on the scribe's personal observations and the provider's statements to them. This document has been checked and approved by the attending provider.

## 2015-09-21 NOTE — Progress Notes (Signed)
BP low. Weight loss noted. Started back taking decadron 2 mg bid and protonix just today. Reports nausea continues and lack of desire to eat. Patient voices she hopes poor appetite will improve now that she is back on steroids. Reports diarrhea last night but, relates this to effects of antibiotics she completed for abscessed tooth. Denies headache but, does reports intermittent twinges at surgical. Reports excess saliva is annoying because she must frequently spit to clear this. Denies dizziness, diplopia or ringing in the ears. Denies confusion. Answers all questions quickly and appropriately.   BP 99/62 mmHg  Pulse 53  Resp 16  Wt 174 lb 6.4 oz (79.107 kg)  SpO2 100% Wt Readings from Last 3 Encounters:  09/21/15 174 lb 6.4 oz (79.107 kg)  09/20/15 179 lb (81.194 kg)  09/15/15 179 lb 8 oz (81.421 kg)

## 2015-09-22 ENCOUNTER — Telehealth: Payer: Self-pay | Admitting: Hematology and Oncology

## 2015-09-22 ENCOUNTER — Ambulatory Visit
Admission: RE | Admit: 2015-09-22 | Discharge: 2015-09-22 | Disposition: A | Discharge status: Home / Self Care | Payer: BC Managed Care – PPO | Source: Ambulatory Visit | Attending: Radiation Oncology | Admitting: Radiation Oncology

## 2015-09-22 ENCOUNTER — Other Ambulatory Visit: Payer: Self-pay | Admitting: *Deleted

## 2015-09-22 DIAGNOSIS — Z510 Encounter for antineoplastic radiation therapy: Secondary | ICD-10-CM | POA: Diagnosis not present

## 2015-09-22 NOTE — Telephone Encounter (Signed)
Aware of added labs

## 2015-09-25 ENCOUNTER — Ambulatory Visit
Admission: RE | Admit: 2015-09-25 | Discharge: 2015-09-25 | Disposition: A | Discharge status: Home / Self Care | Payer: BC Managed Care – PPO | Source: Ambulatory Visit | Attending: Radiation Oncology | Admitting: Radiation Oncology

## 2015-09-25 DIAGNOSIS — Z510 Encounter for antineoplastic radiation therapy: Secondary | ICD-10-CM | POA: Diagnosis not present

## 2015-09-26 ENCOUNTER — Ambulatory Visit (HOSPITAL_BASED_OUTPATIENT_CLINIC_OR_DEPARTMENT_OTHER): Payer: BC Managed Care – PPO

## 2015-09-26 ENCOUNTER — Other Ambulatory Visit (HOSPITAL_BASED_OUTPATIENT_CLINIC_OR_DEPARTMENT_OTHER): Payer: BC Managed Care – PPO

## 2015-09-26 ENCOUNTER — Ambulatory Visit (HOSPITAL_BASED_OUTPATIENT_CLINIC_OR_DEPARTMENT_OTHER): Payer: BC Managed Care – PPO | Admitting: Hematology and Oncology

## 2015-09-26 ENCOUNTER — Encounter: Payer: Self-pay | Admitting: Hematology and Oncology

## 2015-09-26 ENCOUNTER — Telehealth: Payer: Self-pay | Admitting: Hematology and Oncology

## 2015-09-26 ENCOUNTER — Ambulatory Visit
Admission: RE | Admit: 2015-09-26 | Discharge: 2015-09-26 | Disposition: A | Discharge status: Home / Self Care | Payer: BC Managed Care – PPO | Source: Ambulatory Visit | Attending: Radiation Oncology | Admitting: Radiation Oncology

## 2015-09-26 ENCOUNTER — Other Ambulatory Visit: Payer: Self-pay

## 2015-09-26 VITALS — BP 90/38 | HR 52 | Temp 97.8°F | Resp 18 | Ht 62.0 in | Wt 170.9 lb

## 2015-09-26 VITALS — BP 127/48 | HR 57 | Temp 98.0°F | Resp 18

## 2015-09-26 DIAGNOSIS — E876 Hypokalemia: Secondary | ICD-10-CM

## 2015-09-26 DIAGNOSIS — C712 Malignant neoplasm of temporal lobe: Secondary | ICD-10-CM

## 2015-09-26 DIAGNOSIS — C719 Malignant neoplasm of brain, unspecified: Secondary | ICD-10-CM

## 2015-09-26 DIAGNOSIS — Z510 Encounter for antineoplastic radiation therapy: Secondary | ICD-10-CM | POA: Diagnosis not present

## 2015-09-26 LAB — COMPREHENSIVE METABOLIC PANEL
ALT: 15 U/L (ref 0–55)
AST: 14 U/L (ref 5–34)
Albumin: 3.6 g/dL (ref 3.5–5.0)
Alkaline Phosphatase: 56 U/L (ref 40–150)
Anion Gap: 10 mEq/L (ref 3–11)
BUN: 14.3 mg/dL (ref 7.0–26.0)
CHLORIDE: 108 meq/L (ref 98–109)
CO2: 27 meq/L (ref 22–29)
CREATININE: 1.1 mg/dL (ref 0.6–1.1)
Calcium: 9.3 mg/dL (ref 8.4–10.4)
EGFR: 56 mL/min/{1.73_m2} — ABNORMAL LOW (ref >90)
GLUCOSE: 110 mg/dL (ref 70–140)
Potassium: 3.1 mEq/L — ABNORMAL LOW (ref 3.5–5.1)
Sodium: 145 mEq/L (ref 136–145)
TOTAL PROTEIN: 6.3 g/dL — AB (ref 6.4–8.3)
Total Bilirubin: 0.51 mg/dL (ref 0.20–1.20)

## 2015-09-26 LAB — CBC WITH DIFFERENTIAL/PLATELET
BASO%: 0.1 % (ref 0.0–2.0)
Basophils Absolute: 0 10*3/uL (ref 0.0–0.1)
EOS%: 0.2 % (ref 0.0–7.0)
Eosinophils Absolute: 0 10*3/uL (ref 0.0–0.5)
HCT: 39.6 % (ref 34.8–46.6)
HGB: 13.6 g/dL (ref 11.6–15.9)
LYMPH#: 2.4 10*3/uL (ref 0.9–3.3)
LYMPH%: 28.4 % (ref 14.0–49.7)
MCH: 31.3 pg (ref 25.1–34.0)
MCHC: 34.3 g/dL (ref 31.5–36.0)
MCV: 91 fL (ref 79.5–101.0)
MONO#: 0.8 10*3/uL (ref 0.1–0.9)
MONO%: 9 % (ref 0.0–14.0)
NEUT%: 62.3 % (ref 38.4–76.8)
NEUTROS ABS: 5.2 10*3/uL (ref 1.5–6.5)
Platelets: 371 10*3/uL (ref 145–400)
RBC: 4.35 10*6/uL (ref 3.70–5.45)
RDW: 14.1 % (ref 11.2–14.5)
WBC: 8.3 10*3/uL (ref 3.9–10.3)

## 2015-09-26 MED ORDER — SODIUM CHLORIDE 0.9 % IV SOLN: INTRAVENOUS | Status: DC

## 2015-09-26 MED ORDER — SODIUM CHLORIDE 0.9 % IV SOLN
Freq: Once | INTRAVENOUS | Status: AC
  Administered 2015-09-26: 13:00:00 via INTRAVENOUS
  Filled 2015-09-26: qty 1000

## 2015-09-26 NOTE — Telephone Encounter (Signed)
Appointments made and avs printed for patient °

## 2015-09-26 NOTE — Progress Notes (Signed)
Patient Care Team: Carol Ada, MD as PCP - General (Family Medicine)  DIAGNOSIS: No matching staging information was found for the patient.  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 She continues to have profound nausea and vomiting. She is also complaining of increased salivation and decreased taste. She takes Zofran prior to taking temozolomide in spite of that she gets nauseated. She has been unable to eat much food because of metallic taste in the mouth. She has not even started dapsone yet.  REVIEW OF SYSTEMS:   Constitutional: Denies fevers, chills or abnormal weight loss Eyes: Denies blurriness of vision Ears, nose, mouth, throat, and face: decreased taste and increased saliva Respiratory: Denies cough, dyspnea or wheezes Cardiovascular: Denies palpitation, chest discomfort Gastrointestinal: nausea vomiting and constipation Skin: Denies abnormal skin rashes Lymphatics: Denies new lymphadenopathy or easy bruising Neurological:Denies numbness, tingling or new weaknesses Behavioral/Psych: Mood is stable, no new changes  Extremities: No lower extremity edema All other systems were reviewed with the patient and are negative.  I have reviewed the past medical history, past surgical  history, social history and family history with the patient and they are unchanged from previous note.  ALLERGIES:  is allergic to codeine; prednisone; and sulfonamide derivatives.  MEDICATIONS:  Current Outpatient Prescriptions  Medication Sig Dispense Refill  . acetaminophen (TYLENOL) 650 MG CR tablet Take 1,300 mg by mouth every 8 (eight) hours as needed for pain.    Marland Kitchen ALPRAZolam (XANAX) 0.5 MG tablet Take 0.25 mg by mouth 2 (two) times daily.    . dapsone 100 MG tablet Take 1 tablet (100 mg total) by mouth daily. Take daily while receiving radiation 30 tablet 1  . Insulin Glargine (LANTUS) 100 UNIT/ML Solostar Pen Inject 20 Units into the skin daily at 10 pm. 15 mL 11  . lactose free nutrition (BOOST PLUS) LIQD Take 237 mLs by mouth daily. 30 Can 0  . levETIRAcetam (KEPPRA) 500 MG tablet Take 1 tablet (500 mg total) by mouth 2 (two) times daily. 60 tablet 5  . levothyroxine (SYNTHROID, LEVOTHROID) 50 MCG tablet Take 25 mcg by mouth daily.    . methocarbamol (ROBAXIN) 500 MG tablet Reported on 09/21/2015    . nicotine (NICODERM CQ - DOSED IN MG/24 HOURS) 21 mg/24hr patch Place 1 patch (21 mg total) onto the skin daily. 28 patch 0  . ondansetron (ZOFRAN) 8 MG tablet Take 1 tablet (8 mg total) by mouth 3 (three) times daily. 90 tablet 2  . oxyCODONE-acetaminophen (PERCOCET/ROXICET) 5-325 MG tablet     . pantoprazole (PROTONIX) 40 MG tablet Take 1 tablet (40 mg total) by mouth daily. Switch for any other PPI at similar dose and frequency 30 tablet 4  . promethazine (PHENERGAN) 25 MG tablet     . sertraline (ZOLOFT) 50 MG tablet Take 3 tablets (150 mg total) by mouth every  evening. 90 tablet 0  . temozolomide (TEMODAR) 180 MG capsule Take 1 capsule (180 mg total) by mouth daily. May take on an empty stomach or at bedtime to decrease nausea & vomiting. 72 capsule 0  . lisinopril (PRINIVIL,ZESTRIL) 5 MG tablet Take 5 mg by mouth every evening. Reported on 09/26/2015    . metoprolol succinate  (TOPROL-XL) 25 MG 24 hr tablet Take 12.5 mg by mouth every evening. Reported on 09/26/2015     No current facility-administered medications for this visit.   Facility-Administered Medications Ordered in Other Visits  Medication Dose Route Frequency Provider Last Rate Last Dose  . SONAFINE emulsion 1 application  1 application Topical BID Tyler Pita, MD   1 application at 31/49/70 1517    PHYSICAL EXAMINATION: ECOG PERFORMANCE STATUS: 1 - Symptomatic but completely ambulatory  Filed Vitals:   09/26/15 1015 09/26/15 1019  BP: 103/38 90/38  Pulse: 52   Temp: 97.8 F (36.6 C)   Resp: 18    Filed Weights   09/26/15 1015  Weight: 170 lb 14.4 oz (77.52 kg)    GENERAL:alert, no distress and comfortable SKIN: skin color, texture, turgor are normal, no rashes or significant lesions EYES: normal, Conjunctiva are pink and non-injected, sclera clear OROPHARYNX:no exudate, no erythema and lips, buccal mucosa, and tongue normal  NECK: supple, thyroid normal size, non-tender, without nodularity LYMPH:  no palpable lymphadenopathy in the cervical, axillary or inguinal LUNGS: clear to auscultation and percussion with normal breathing effort HEART: regular rate & rhythm and no murmurs and no lower extremity edema ABDOMEN:abdomen soft, non-tender and normal bowel sounds MUSCULOSKELETAL:no cyanosis of digits and no clubbing  NEURO: alert & oriented x 3 with fluent speech, no focal motor/sensory deficits EXTREMITIES: No lower extremity edema  LABORATORY DATA:  I have reviewed the data as listed   Chemistry      Component Value Date/Time   NA 145 09/26/2015 0918   NA 135 08/10/2015 0322   K 3.1* 09/26/2015 0918   K 4.6 08/10/2015 0322   CL 102 08/10/2015 0322   CO2 27 09/26/2015 0918   CO2 26 08/10/2015 0322   BUN 14.3 09/26/2015 0918   BUN 17 08/10/2015 0322   CREATININE 1.1 09/26/2015 0918   CREATININE 0.91 08/10/2015 0322      Component Value Date/Time   CALCIUM 9.3 09/26/2015  0918   CALCIUM 8.6* 08/10/2015 0322   ALKPHOS 56 09/26/2015 0918   ALKPHOS 112 07/24/2015 1530   AST 14 09/26/2015 0918   AST 21 07/24/2015 1530   ALT 15 09/26/2015 0918   ALT 22 07/24/2015 1530   BILITOT 0.51 09/26/2015 0918   BILITOT 0.2* 07/24/2015 1530       Lab Results  Component Value Date   WBC 8.3 09/26/2015   HGB 13.6 09/26/2015   HCT 39.6 09/26/2015   MCV 91.0 09/26/2015   PLT 371 09/26/2015   NEUTROABS 5.2 09/26/2015   ASSESSMENT & PLAN:  Glioblastoma multiforme of brain ([place]) Brain MRI 07/24/2015: Heterogeneous enhancing right frontal lobe mass 4.7 x 3.6 x 3.3 cm with vasogenic edema, heterogeneous enhancing mid right temporal lobe mass 4.1 x 3.1 x 2.2 cm with edema and right-to-left shift and mass effect. 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 Nausea in spite of taking Zofran 2. Decreased taste 3. Increased salivation 4. Decreased appetite 5. Constipation: Encouraged her to use Maalox 6. Metallic taste in the mouth  Prognosis: I discussed with her that her prognosis is guarded given multiple high risk features including subtotal resection along with lack of IDH mutation and MGMT methylation.  Return to clinic in 2 weeks   Orders Placed This Encounter  Procedures  . CBC with Differential    Standing Status: Future     Number of Occurrences:      Standing  Expiration Date: 09/25/2016  . Comprehensive metabolic panel    Standing Status: Future     Number of Occurrences:      Standing Expiration Date: 09/25/2016   The patient has a good understanding of the overall plan. she agrees with it. she will call with any problems that may develop before the next visit here.   Rulon Eisenmenger, MD 09/26/2015

## 2015-09-26 NOTE — Assessment & Plan Note (Signed)
Brain MRI 07/24/2015: Heterogeneous enhancing right frontal lobe mass 4.7 x 3.6 x 3.3 cm with vasogenic edema, heterogeneous enhancing mid right temporal lobe mass 4.1 x 3.1 x 2.2 cm with edema and right-to-left shift and mass effect. 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 Nausea which improved after taking Zofran 2. Decreased taste  Return to clinic in

## 2015-09-26 NOTE — Progress Notes (Signed)
Pt reports unable to eat - "everything tastes different, excessive salivation, can only get a bite in at a time, changes in taste, everything tastes metallic".  Has not eatent much last 3 days, getting max 48 oz fluid.   Pt would like confirmation that her insulin dose is correct and would like to be checked for thrush. For the past 4 days, pt has been feeling dizzy "like I'm going to pass out".   Pt has abscessed tooth.  Pt reports she is taking 2 mg dexamethasone BID.  Pt reports Dr. Tammi Klippel has her stop lisinopril and metropolol.

## 2015-09-26 NOTE — Patient Instructions (Signed)

## 2015-09-27 ENCOUNTER — Ambulatory Visit
Admission: RE | Admit: 2015-09-27 | Discharge: 2015-09-27 | Disposition: A | Discharge status: Home / Self Care | Payer: BC Managed Care – PPO | Source: Ambulatory Visit | Attending: Radiation Oncology | Admitting: Radiation Oncology

## 2015-09-27 ENCOUNTER — Telehealth: Payer: Self-pay | Admitting: Radiation Oncology

## 2015-09-27 DIAGNOSIS — Z510 Encounter for antineoplastic radiation therapy: Secondary | ICD-10-CM | POA: Diagnosis not present

## 2015-09-27 NOTE — Progress Notes (Signed)
Patient questions if she should continue her xanax and zoloft while taking ativan for nausea. Instructed patient to continue xanax and zoloft as directed. Patient verbalized understanding.

## 2015-09-27 NOTE — Telephone Encounter (Signed)
Explained to the patient we are prescribing ativan to replace zofran. Explained she should take ativan 1 tablet by mouth every 4-6 hours as needed for nausea. Instructed patient not to drive while taking Ativan. Patient reported she hadn't had a bowel movement in 4 or 5 days. Per Shona Simpson, PA instructed patient to take milk of magnesia twice per day until she has a bowel movement then, stop MOM and  begin taking Miralax once daily. Patient and mother both verbalized understanding of all discussed.

## 2015-09-27 NOTE — Telephone Encounter (Signed)
Per Tito Dine, PA order called in Ativan 0.5 mg, one tablet by mouth every 4-6 hours prn nausea, qty 30, and three refills. This is intended to take the place of Zofran that was originally prescribed that "isn't working" well for he patient.

## 2015-09-28 ENCOUNTER — Encounter: Payer: Self-pay | Admitting: Pharmacist

## 2015-09-28 ENCOUNTER — Inpatient Hospital Stay
Admission: RE | Admit: 2015-09-28 | Payer: BC Managed Care – PPO | Source: Ambulatory Visit | Admitting: Radiation Oncology

## 2015-09-28 ENCOUNTER — Telehealth: Payer: Self-pay

## 2015-09-28 ENCOUNTER — Ambulatory Visit
Admission: RE | Admit: 2015-09-28 | Discharge: 2015-09-28 | Disposition: A | Discharge status: Home / Self Care | Payer: BC Managed Care – PPO | Source: Ambulatory Visit | Attending: Radiation Oncology | Admitting: Radiation Oncology

## 2015-09-28 DIAGNOSIS — Z510 Encounter for antineoplastic radiation therapy: Secondary | ICD-10-CM | POA: Diagnosis not present

## 2015-09-28 NOTE — Telephone Encounter (Signed)
Call rcvd from Armanda Magic, desk RN for Dr.  Julien Nordmann, that Ms. Aderholt was in radiation and requesting a refill from Dr. Julien Nordmann for dapsone.  Writer called [phone] - spoke with Enid Derry who reports that patient left for another appointment.  Called pt and spoke with her.  She admits that she "is confused about things" and could not remember what she needed.  She reports change of pharmacy to [facility], that she had rcvd metropolol and synthroid, and she could not find the synthroid.  Advised pt that synthroid was not prescribed by Dr. Lindi Adie and she would need to contact the original prescriber.  Reminded the patient that she had asked about dapsone.  She then recalled that she had called pharmacist and they had told her she should not take it because she is allergic to sulfa.  Advised pt that she was on the dapsone because of the sulfa allergy, that we had other patients with the sulfar allergy who were taking dapsone with no problem, that we normally prescribe bactrim but could not in this case because of her allergy and that is why Dr. Lindi Adie prescribed the dapsone.  Advised pt that she needed to be taking the dapsone to prevent other illnesses while going through chemoradiation.  Reminded pt that I told her when she was here for her office visit on 1/24 that she needed to be taking the dapsone.    Pt questioned when she should take the dapsone, as it says to take with food and she has been unable to eat due to constant nausea.  Pt reports she has zofran and compazine for nausea and neither worked.  Advised pt to take the zofran 30 minutes prior to the temodar at night.  Pt reports she was told to try taking temodar in the am, that she didn't have any more zofran, and that she had been prescribed ativan for the nausea.  Pt expressed concern about taking ativan while she is also taking xanax.    Let pt know writer would discuss with Dr. Lindi Adie in the am and call her.  Her radiation appt is at 11 am.  Pt  voiced understanding.    Routed to Dr. Lindi Adie

## 2015-09-29 ENCOUNTER — Ambulatory Visit
Admission: RE | Admit: 2015-09-29 | Discharge: 2015-09-29 | Disposition: A | Discharge status: Home / Self Care | Payer: BC Managed Care – PPO | Source: Ambulatory Visit | Attending: Radiation Oncology | Admitting: Radiation Oncology

## 2015-09-29 ENCOUNTER — Ambulatory Visit (HOSPITAL_BASED_OUTPATIENT_CLINIC_OR_DEPARTMENT_OTHER): Payer: BC Managed Care – PPO

## 2015-09-29 ENCOUNTER — Telehealth: Payer: Self-pay

## 2015-09-29 ENCOUNTER — Encounter: Payer: Self-pay | Admitting: Radiation Oncology

## 2015-09-29 ENCOUNTER — Encounter: Payer: Self-pay | Admitting: Hematology and Oncology

## 2015-09-29 VITALS — BP 127/76 | HR 66 | Temp 98.2°F

## 2015-09-29 VITALS — BP 93/56 | HR 57 | Resp 16 | Wt 170.4 lb

## 2015-09-29 DIAGNOSIS — I10 Essential (primary) hypertension: Secondary | ICD-10-CM

## 2015-09-29 DIAGNOSIS — F329 Major depressive disorder, single episode, unspecified: Secondary | ICD-10-CM

## 2015-09-29 DIAGNOSIS — C719 Malignant neoplasm of brain, unspecified: Secondary | ICD-10-CM

## 2015-09-29 DIAGNOSIS — R110 Nausea: Secondary | ICD-10-CM | POA: Insufficient documentation

## 2015-09-29 DIAGNOSIS — F32A Depression, unspecified: Secondary | ICD-10-CM

## 2015-09-29 DIAGNOSIS — C712 Malignant neoplasm of temporal lobe: Secondary | ICD-10-CM

## 2015-09-29 DIAGNOSIS — F410 Panic disorder [episodic paroxysmal anxiety] without agoraphobia: Secondary | ICD-10-CM

## 2015-09-29 DIAGNOSIS — E139 Other specified diabetes mellitus without complications: Secondary | ICD-10-CM

## 2015-09-29 DIAGNOSIS — Z510 Encounter for antineoplastic radiation therapy: Secondary | ICD-10-CM | POA: Diagnosis not present

## 2015-09-29 DIAGNOSIS — Z720 Tobacco use: Secondary | ICD-10-CM

## 2015-09-29 DIAGNOSIS — F411 Generalized anxiety disorder: Secondary | ICD-10-CM

## 2015-09-29 DIAGNOSIS — I059 Rheumatic mitral valve disease, unspecified: Secondary | ICD-10-CM

## 2015-09-29 DIAGNOSIS — R4781 Slurred speech: Secondary | ICD-10-CM

## 2015-09-29 DIAGNOSIS — D496 Neoplasm of unspecified behavior of brain: Secondary | ICD-10-CM

## 2015-09-29 DIAGNOSIS — G9389 Other specified disorders of brain: Secondary | ICD-10-CM

## 2015-09-29 DIAGNOSIS — R29810 Facial weakness: Secondary | ICD-10-CM

## 2015-09-29 DIAGNOSIS — K602 Anal fissure, unspecified: Secondary | ICD-10-CM

## 2015-09-29 MED ORDER — ONDANSETRON HCL 40 MG/20ML IJ SOLN
8.0000 mg | Freq: Once | INTRAMUSCULAR | Status: DC
  Filled 2015-09-29: qty 4

## 2015-09-29 MED ORDER — SODIUM CHLORIDE 0.9 % IV SOLN
Freq: Once | INTRAVENOUS | Status: AC
  Administered 2015-09-29: 13:00:00 via INTRAVENOUS

## 2015-09-29 MED ORDER — SODIUM CHLORIDE 0.9 % IV SOLN
1000.0000 mL | Freq: Once | INTRAVENOUS | Status: DC
  Filled 2015-09-29: qty 1000

## 2015-09-29 MED ORDER — SODIUM CHLORIDE 0.9 % IV SOLN
Freq: Once | INTRAVENOUS | Status: AC
  Administered 2015-09-29: 14:00:00 via INTRAVENOUS
  Filled 2015-09-29: qty 4

## 2015-09-29 MED ORDER — ONDANSETRON HCL 8 MG PO TABS: 8.0000 mg | ORAL_TABLET | Freq: Three times a day (TID) | ORAL | Status: DC

## 2015-09-29 NOTE — Progress Notes (Signed)
Received [facility] application which needed completing. Completed the detailed insurance information with what I had available and called [facility] for RX information. Patient needs to submit W-2 and RX insurance card if available. Called son-Madison to relay this information to his mom to see if she can bring on Monday 1/30 to me. Tried to call patient but no answer.

## 2015-09-29 NOTE — Progress Notes (Signed)
  Radiation Oncology         [phone]   Name: Michelle Duran MRN: [id number]   Date: 09/29/2015  DOB: 1956-05-18   Weekly Radiation Therapy Management    ICD-9-CM ICD-10-CM   1. Nausea without vomiting 787.02 R11.0 0.9 %  sodium chloride infusion     ondansetron (ZOFRAN) 8 mg in sodium chloride 0.9 % 50 mL IVPB  2. Glioblastoma multiforme of brain (HCC) 191.9 C71.9 0.9 %  sodium chloride infusion     ondansetron (ZOFRAN) 8 mg in sodium chloride 0.9 % 50 mL IVPB    Current Dose: 44 Gy  Planned Dose:  60Gy  Narrative The patient presents for routine under treatment assessment.  BP low. Proves to be slightly orthostatic. Weight is stable. Patient is unable to eat due to nausea. Reports that even looking at or smelling food makes her nauseous. Reports that she is often spitting up thick clear sputum. Reports abdominal pain "because I can't eat and my stomach is empty."  Reports occasional "twinge" at surgical site. Was prescribed ear drops for right ear by Dr. Cyndy Freeze. Questions if these pills are safe to take during radiation. Reports her right ear is itchy and hearing is muffled. Right eye continues to be puffy. Continues decadron 2 mg bid. Questions if 10 units of insulin is still appropriate coverage since decadron has been tapered. Reports blood sugar this morning was 79. Patient reports she has not been taking Dapsone. Patient is very confused about what medications to take and when. Patient reports she is getting conflicting information about her mediations.   Set-up films were reviewed. The chart was checked.  Physical Findings  weight is 170 lb 6.4 oz (77.293 kg). Her blood pressure is 93/56 and her pulse is 57. Her respiration is 16 and oxygen saturation is 100%.  In general this is a well-appearing Caucasian female in no acute distress. She is alert and oriented times for appropriate examination. No acute distress is noted of cardiopulmonary assessment.  Impression The patient is  tolerating radiation.  Plan Continue treatment as planned. Increase Decadron to 4 mg this evening to help with appetite and nausea. Suggested peroxide/water mixture to help with excessive saliva (1/8th peroxide and 7/8th H20). Gave patient written instructions for Zofran, Phenergan, and Ativan regimen. Suggested IV fluids to address low blood pressure and prevent dehydration. Assured the patient that ear drops are okay to take.      The above documentation reflects my direct findings during this shared patient visit. Please see the separate note by Dr. Tammi Klippel on this date for the remainder of the patient's plan of care.  Carola Rhine, PAC   This document serves as a record of services personally performed by Tyler Pita, MD. It was created on their behalf by Jenell Milliner, a trained medical scribe. The creation of this record is based on the scribe's personal observations and the provider's statements to them. This document has been checked and approved by the attending provider.

## 2015-09-29 NOTE — Patient Instructions (Signed)
Contact our office if you have any questions following today's appointment: [phone].  

## 2015-09-29 NOTE — Patient Instructions (Signed)

## 2015-09-29 NOTE — Progress Notes (Signed)
BP low. Proves to be slightly orthostatic. Weight stable. Patient unable to eat because of nausea. Spitting up thick clear sputum often. Reports abdominal pain "because I can't eat and my stomach is empty." Reports occasional "twinge" at surgical site. Prescribed ear drops for right ear by Dr. Cyndy Freeze. Questions if these are safe to take during radiation. Reports her right ear is itchy and hearing is muffled. Right eye continues to be puffy. Continues decadron 2 mg bid. Questions if 10 units of insulin is still appropriate coverage since decadron has been tapered. Reports blood sugar this morning was 79. Patient reports she has not been taking Dapsone. Patient is very confused about what medications to take and when. Patient reports she is getting conflicting information about her mediations.   Sitting bp 106/53 and heart rate 51 Standing bp 93/56 and heart rate 57 BP 93/56 mmHg  Pulse 57  Resp 16  Wt 170 lb 6.4 oz (77.293 kg)  SpO2 100% Wt Readings from Last 3 Encounters:  09/29/15 170 lb 6.4 oz (77.293 kg)  09/26/15 170 lb 14.4 oz (77.52 kg)  09/21/15 174 lb 6.4 oz (79.107 kg)

## 2015-10-02 ENCOUNTER — Ambulatory Visit
Admission: RE | Admit: 2015-10-02 | Discharge: 2015-10-02 | Disposition: A | Discharge status: Home / Self Care | Payer: BC Managed Care – PPO | Source: Ambulatory Visit | Attending: Radiation Oncology | Admitting: Radiation Oncology

## 2015-10-02 ENCOUNTER — Other Ambulatory Visit: Payer: Self-pay

## 2015-10-02 ENCOUNTER — Encounter: Payer: Self-pay | Admitting: Hematology and Oncology

## 2015-10-02 ENCOUNTER — Telehealth: Payer: Self-pay | Admitting: Oncology

## 2015-10-02 DIAGNOSIS — C719 Malignant neoplasm of brain, unspecified: Secondary | ICD-10-CM

## 2015-10-02 DIAGNOSIS — Z510 Encounter for antineoplastic radiation therapy: Secondary | ICD-10-CM | POA: Diagnosis not present

## 2015-10-02 MED ORDER — TEMOZOLOMIDE 180 MG PO CAPS: 180.0000 mg | ORAL_CAPSULE | Freq: Every day | ORAL | Status: DC

## 2015-10-02 MED ORDER — ACCU-CHEK AVIVA PLUS VI STRP: ORAL_STRIP | Status: DC

## 2015-10-02 NOTE — Telephone Encounter (Signed)
Error

## 2015-10-02 NOTE — Addendum Note (Signed)
Addended by: Prentiss Bells on: 10/02/2015 04:26 PM   Modules accepted: Orders

## 2015-10-02 NOTE — Progress Notes (Signed)
Left message for Michelle Duran at [place]- [phone] about temodar. See prev notes. Gerald Stabs had not done anything with her--see prev notes. Patient said last shipment was in dec and she has paid nothing. She said she got a call about asst with cancercare(she had paperwork) when she met with [place]

## 2015-10-02 NOTE — Telephone Encounter (Signed)
[facility]  [facility] and gave the number of [phone].

## 2015-10-02 NOTE — Progress Notes (Signed)
Patient reports that she went to pick up a prescription for Zofran and was told that her insurance would not approve it.  She also said she was given a prescription for antibiotic drops because her ear is red.  She said it would be $47.00 and she does not want to fill it now.  She wants to make sure this is OK.  Advised her that we will check to see if a prior authorization needs to be done and call her back.

## 2015-10-02 NOTE — Telephone Encounter (Signed)
Pt notified this RN this am (walk in) that she is out of temodar and figured it out this am when she "put her finger in the bottle and it was empty".  Pt did not have temodar for today's treatment.  Contact [facility] - pt discharged to [place]. Contacted [facility] for pre-auth.  Auth # [id number] 10/02/15 - 09/30/17. New Rx [facility] [place].  Per [facility], they need pt to call in to complete acct set up.  Writer notified and provided phone no.  Pt reports she will call right away.  [facility] will not ship until copay is made, pt copay is $100.  Pt states she does not have that right now.  Montel Clock will Water quality scientist for assistance.    MD notified.

## 2015-10-02 NOTE — Telephone Encounter (Addendum)
Called in prior authorization to [phone].  Zofran was approved for 6 months and approval number was [id number].  Called [facility] to [facility] on [address].  Lucendia Herrlich, RN in Dr. Geralyn Flash office and notified her of the approval.  Also called Shakeda and let her know about the approval and that she should pick up her Zo[facility] refill at [facility].

## 2015-10-02 NOTE — Progress Notes (Signed)
Pt requested refill on glucose test strips.  Rx escribed.

## 2015-10-02 NOTE — Progress Notes (Signed)
Michelle Duran called back and left message they no longer fill as of Jan 2017-- [facility].Marland Kitchen He did say she does have pat asst with [employer]-because copay is 100.00. They will reach out to her and let her know. [name] has her letter from [employer] and is waiting on income from her to send in for her.

## 2015-10-03 ENCOUNTER — Other Ambulatory Visit: Payer: Self-pay | Admitting: Radiation Oncology

## 2015-10-03 ENCOUNTER — Encounter: Payer: Self-pay | Admitting: Pharmacist

## 2015-10-03 ENCOUNTER — Telehealth: Payer: Self-pay | Admitting: Radiation Oncology

## 2015-10-03 ENCOUNTER — Ambulatory Visit
Admission: RE | Admit: 2015-10-03 | Discharge: 2015-10-03 | Disposition: A | Discharge status: Home / Self Care | Payer: BC Managed Care – PPO | Source: Ambulatory Visit | Attending: Radiation Oncology | Admitting: Radiation Oncology

## 2015-10-03 DIAGNOSIS — C719 Malignant neoplasm of brain, unspecified: Secondary | ICD-10-CM

## 2015-10-03 DIAGNOSIS — Z510 Encounter for antineoplastic radiation therapy: Secondary | ICD-10-CM | POA: Diagnosis not present

## 2015-10-03 MED ORDER — ACCU-CHEK AVIVA PLUS VI STRP: ORAL_STRIP | Status: DC

## 2015-10-03 NOTE — Telephone Encounter (Signed)
Late entry from 10/02/15 at 1013. Returned patient's call. Explained she should still present for her radiation treatment today even though she is out of her Temodar. Patient reports she has informed medical oncology she is out of Temodar and "they are working on it."

## 2015-10-03 NOTE — Progress Notes (Signed)
I spent 45 minutes with the patient and her mother revealing her medications and coming up with a timeline for her to take her medications each day. A copy of this was provided to her and scan the medial tablet the chart.

## 2015-10-03 NOTE — Progress Notes (Signed)
10/03/15: Ms. Gatto has been approved for the [employer] Patient Assistance Program and will receive Temodar at $0 copay. Request to pharmacy for expedited review. Ms. Silcott will receive Temodar Wednesday or Thursday at the latest.   Thank you,  Montel Clock, PharmD, [facility]

## 2015-10-03 NOTE — Telephone Encounter (Signed)
Thanks for taking care of this yesterday!

## 2015-10-04 ENCOUNTER — Ambulatory Visit
Admission: RE | Admit: 2015-10-04 | Discharge: 2015-10-04 | Disposition: A | Discharge status: Home / Self Care | Payer: BC Managed Care – PPO | Source: Ambulatory Visit | Attending: Radiation Oncology | Admitting: Radiation Oncology

## 2015-10-04 ENCOUNTER — Encounter: Payer: Self-pay | Admitting: Hematology and Oncology

## 2015-10-04 DIAGNOSIS — Z510 Encounter for antineoplastic radiation therapy: Secondary | ICD-10-CM | POA: Diagnosis not present

## 2015-10-04 NOTE — Progress Notes (Signed)
Patient brought in proof of income on 10/03/15 for [facility] copay assistance. Faxed to [place] and mailed hard copy as well.

## 2015-10-05 ENCOUNTER — Ambulatory Visit
Admission: RE | Admit: 2015-10-05 | Discharge: 2015-10-05 | Disposition: A | Discharge status: Home / Self Care | Payer: BC Managed Care – PPO | Source: Ambulatory Visit | Attending: Radiation Oncology | Admitting: Radiation Oncology

## 2015-10-05 ENCOUNTER — Encounter: Payer: Self-pay | Admitting: Radiation Oncology

## 2015-10-05 VITALS — BP 103/47 | HR 53 | Wt 166.8 lb

## 2015-10-05 DIAGNOSIS — C719 Malignant neoplasm of brain, unspecified: Secondary | ICD-10-CM

## 2015-10-05 DIAGNOSIS — Z510 Encounter for antineoplastic radiation therapy: Secondary | ICD-10-CM | POA: Diagnosis not present

## 2015-10-05 NOTE — Progress Notes (Signed)
  Radiation Oncology         [phone]   Name: Michelle Duran MRN: [id number]   Date: 10/05/2015  DOB: 09-30-1955   Weekly Radiation Therapy Management    ICD-9-CM ICD-10-CM   1. Glioblastoma multiforme of brain (HCC) 191.9 C71.9     Current Dose: 52 Gy  Planned Dose:  60 Gy  Narrative The patient presents for routine under treatment assessment.   On review of systems, the patient reports that she is doing well. She has noted a 4 pound weight loss in the last week. She states that her symptoms now has been altered in the last few weeks because of radiation therapy and even her in the shower does not appeal to her. She states that this translates to her being nauseated as well despite taking scheduled Zofran and Ativan. She reports that her fasting blood sugar was 110 this morning. Reports that nausea continues. Reports intense ringing in her right ear. Reports freckling on the forehead skin. No other complaints or verbalized   Set-up films were reviewed. The chart was checked.  Physical Findings  weight is 166 lb 12.8 oz (75.66 kg). Her blood pressure is 103/47 and her pulse is 53. Her oxygen saturation is 96%.  In general, this is a well-appearing Caucasian female in no acute distress. She is alert and oriented times for appropriate examination. her forehead has freckling and what appear to be keratotic changes of the skin without evidence of ulceration or large lesions her ears are evaluated in the external auditory canal has waxed bilaterally right greater than left with questionable fluid behind the TM. Her oral mucosa is intact however White coating of the entire tongue is appreciated. It does not appear to be candida like however.   Impression  60 year old female with glioblastoma, with persistent nausea   Plan  The patient has four additional treatments to complete. We will proceed with these, we have discussed continuing her scheduled medications, and to increase her Ativan for nausea  relief as well. If her symptoms persist more than a few weeks past completion of her radiation, we did discuss considering supportive management consult. The patient states agreement and understanding. She will also meet with Dr. Erik Obey to evaluate her tinnitus and address the cerumen noted on exam. We will plan to see her back in 4 weeks time for follow-up.      The above documentation reflects my direct findings during this shared patient visit. Please see the separate note by Dr. Tammi Klippel on this date for the remainder of the patient's plan of care.  Carola Rhine, PAC  This document serves as a record of services personally performed by Shona Simpson, PA and Tyler Pita, MD. It was created on their behalf by Jenell Milliner, a trained medical scribe. The creation of this record is based on the scribe's personal observations and the provider's statements to them. This document has been checked and approved by the attending provider.

## 2015-10-05 NOTE — Progress Notes (Signed)
Vitals stable. Four pound weight loss noted. Patient reports her blood sugar this morning was 110. Reports nausea continues. Reports intense ringing in her right ear. Copious amounts of wax noted in her right ear and suspected fluid behind her left. Will arrange for patient to be seen by Dr. Erik Obey.

## 2015-10-05 NOTE — Patient Instructions (Signed)
Contact our office if you have any questions following today's appointment: [phone].  

## 2015-10-06 ENCOUNTER — Ambulatory Visit
Admission: RE | Admit: 2015-10-06 | Discharge: 2015-10-06 | Disposition: A | Discharge status: Home / Self Care | Payer: BC Managed Care – PPO | Source: Ambulatory Visit | Attending: Radiation Oncology | Admitting: Radiation Oncology

## 2015-10-06 ENCOUNTER — Telehealth: Payer: Self-pay | Admitting: Radiation Oncology

## 2015-10-06 DIAGNOSIS — Z510 Encounter for antineoplastic radiation therapy: Secondary | ICD-10-CM | POA: Diagnosis not present

## 2015-10-06 NOTE — Telephone Encounter (Signed)
Patient phoned to report she is still at Dr. Noreene Filbert office having her ears examined and won't be able to make 1100 radiation treatment appointment. Per Elmyra Ricks, RT on L4 instructed patient to present at 1230 for treatment instead. Patient verbalized understanding.

## 2015-10-09 ENCOUNTER — Telehealth: Payer: Self-pay | Admitting: Radiation Oncology

## 2015-10-09 ENCOUNTER — Other Ambulatory Visit: Payer: Self-pay | Admitting: Radiation Oncology

## 2015-10-09 ENCOUNTER — Other Ambulatory Visit: Payer: BC Managed Care – PPO | Admitting: Radiation Oncology

## 2015-10-09 ENCOUNTER — Ambulatory Visit
Admission: RE | Admit: 2015-10-09 | Discharge: 2015-10-09 | Disposition: A | Discharge status: Home / Self Care | Payer: BC Managed Care – PPO | Source: Ambulatory Visit | Attending: Radiation Oncology | Admitting: Radiation Oncology

## 2015-10-09 DIAGNOSIS — R110 Nausea: Secondary | ICD-10-CM

## 2015-10-09 DIAGNOSIS — E860 Dehydration: Secondary | ICD-10-CM

## 2015-10-09 DIAGNOSIS — C719 Malignant neoplasm of brain, unspecified: Secondary | ICD-10-CM

## 2015-10-09 DIAGNOSIS — Z510 Encounter for antineoplastic radiation therapy: Secondary | ICD-10-CM | POA: Diagnosis not present

## 2015-10-09 DIAGNOSIS — E876 Hypokalemia: Secondary | ICD-10-CM

## 2015-10-09 LAB — COMPREHENSIVE METABOLIC PANEL
ALT: 19 U/L (ref 0–55)
ANION GAP: 12 meq/L — AB (ref 3–11)
AST: 15 U/L (ref 5–34)
Albumin: 3.6 g/dL (ref 3.5–5.0)
Alkaline Phosphatase: 44 U/L (ref 40–150)
BILIRUBIN TOTAL: 1.19 mg/dL (ref 0.20–1.20)
BUN: 18.3 mg/dL (ref 7.0–26.0)
CALCIUM: 9.1 mg/dL (ref 8.4–10.4)
CO2: 27 meq/L (ref 22–29)
CREATININE: 1.1 mg/dL (ref 0.6–1.1)
Chloride: 106 mEq/L (ref 98–109)
EGFR: 54 mL/min/{1.73_m2} — ABNORMAL LOW (ref >90)
Glucose: 154 mg/dl — ABNORMAL HIGH (ref 70–140)
Potassium: 3.1 mEq/L — ABNORMAL LOW (ref 3.5–5.1)
Sodium: 145 mEq/L (ref 136–145)
TOTAL PROTEIN: 6 g/dL — AB (ref 6.4–8.3)

## 2015-10-09 MED ORDER — SODIUM CHLORIDE 0.9 % IV SOLN
Freq: Once | INTRAVENOUS | Status: DC
  Filled 2015-10-09: qty 4

## 2015-10-09 MED ORDER — POTASSIUM CHLORIDE ER 10 MEQ PO CPCR: 20.0000 meq | ORAL_CAPSULE | Freq: Two times a day (BID) | ORAL | Status: DC

## 2015-10-09 MED ORDER — SODIUM CHLORIDE 0.9 % IV SOLN
Freq: Once | INTRAVENOUS | Status: AC
  Administered 2015-10-09: 14:00:00 via INTRAVENOUS
  Filled 2015-10-09: qty 250

## 2015-10-09 MED ORDER — SODIUM CHLORIDE 0.9 % IV SOLN
INTRAVENOUS | Status: DC
  Filled 2015-10-09: qty 1000

## 2015-10-09 MED ORDER — SODIUM CHLORIDE 0.9 % IV SOLN
Freq: Once | INTRAVENOUS | Status: AC
  Administered 2015-10-09: 12:00:00 via INTRAVENOUS
  Filled 2015-10-09: qty 4

## 2015-10-09 MED ORDER — SODIUM CHLORIDE 0.9 % IV SOLN
Freq: Once | INTRAVENOUS | Status: AC
  Administered 2015-10-09: 12:00:00 via INTRAVENOUS
  Filled 2015-10-09: qty 1000

## 2015-10-09 NOTE — Progress Notes (Addendum)
Called to tomo dressing room 1 to assess patient status. Patient reports weakness. Patient proved to be orthostatic. Patient Afebrile. Informed Shona Simpson, PA of findings. Stat CMP and 1 liter normal saline ordered.   Sitting bp 86/47, pulse 57 Standing bp 86/37, pulse 50

## 2015-10-09 NOTE — Telephone Encounter (Signed)
Understand from the patient she is unable to pick up blood sugar test strips ordered by Shona Simpson, PA. Spoke with Thayer Headings at [facility] on [address]. She reports the strips are there and ready for pick up. Informed patient of this findings.

## 2015-10-10 ENCOUNTER — Ambulatory Visit: Payer: BC Managed Care – PPO

## 2015-10-10 DIAGNOSIS — Z510 Encounter for antineoplastic radiation therapy: Secondary | ICD-10-CM | POA: Diagnosis not present

## 2015-10-10 NOTE — Assessment & Plan Note (Signed)
Brain MRI 07/24/2015: Heterogeneous enhancing right frontal lobe mass 4.7 x 3.6 x 3.3 cm with vasogenic edema, heterogeneous enhancing mid right temporal lobe mass 4.1 x 3.1 x 2.2 cm with edema and right-to-left shift and mass effect. 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 Nausea in spite of taking Zofran 2. Decreased taste 3. Increased salivation 4. Decreased appetite 5. Constipation: Encouraged her to use Maalox 6. Metallic taste in the mouth  Prognosis: I discussed with her that her prognosis is guarded given multiple high risk features including subtotal resection along with lack of IDH mutation and MGMT methylation.

## 2015-10-11 ENCOUNTER — Other Ambulatory Visit: Payer: Self-pay | Admitting: *Deleted

## 2015-10-11 ENCOUNTER — Encounter: Payer: Self-pay | Admitting: Radiation Oncology

## 2015-10-11 ENCOUNTER — Telehealth: Payer: Self-pay | Admitting: *Deleted

## 2015-10-11 ENCOUNTER — Ambulatory Visit (HOSPITAL_BASED_OUTPATIENT_CLINIC_OR_DEPARTMENT_OTHER): Payer: BC Managed Care – PPO

## 2015-10-11 ENCOUNTER — Ambulatory Visit: Payer: BC Managed Care – PPO

## 2015-10-11 ENCOUNTER — Other Ambulatory Visit (HOSPITAL_BASED_OUTPATIENT_CLINIC_OR_DEPARTMENT_OTHER): Payer: BC Managed Care – PPO

## 2015-10-11 ENCOUNTER — Telehealth: Payer: Self-pay

## 2015-10-11 ENCOUNTER — Encounter: Payer: Self-pay | Admitting: Hematology and Oncology

## 2015-10-11 ENCOUNTER — Ambulatory Visit (HOSPITAL_BASED_OUTPATIENT_CLINIC_OR_DEPARTMENT_OTHER): Payer: BC Managed Care – PPO | Admitting: Hematology and Oncology

## 2015-10-11 VITALS — BP 97/45 | HR 50 | Temp 97.7°F | Resp 18 | Ht 62.0 in | Wt 164.5 lb

## 2015-10-11 DIAGNOSIS — Z510 Encounter for antineoplastic radiation therapy: Secondary | ICD-10-CM | POA: Diagnosis not present

## 2015-10-11 DIAGNOSIS — C712 Malignant neoplasm of temporal lobe: Secondary | ICD-10-CM

## 2015-10-11 DIAGNOSIS — C711 Malignant neoplasm of frontal lobe: Secondary | ICD-10-CM | POA: Diagnosis not present

## 2015-10-11 DIAGNOSIS — B370 Candidal stomatitis: Secondary | ICD-10-CM

## 2015-10-11 DIAGNOSIS — K047 Periapical abscess without sinus: Secondary | ICD-10-CM | POA: Insufficient documentation

## 2015-10-11 DIAGNOSIS — C719 Malignant neoplasm of brain, unspecified: Secondary | ICD-10-CM

## 2015-10-11 LAB — COMPREHENSIVE METABOLIC PANEL WITH GFR
ALT: 17 U/L (ref 0–55)
AST: 13 U/L (ref 5–34)
Albumin: 3.4 g/dL — ABNORMAL LOW (ref 3.5–5.0)
Alkaline Phosphatase: 42 U/L (ref 40–150)
Anion Gap: 12 meq/L — ABNORMAL HIGH (ref 3–11)
BUN: 16.1 mg/dL (ref 7.0–26.0)
CO2: 21 meq/L — ABNORMAL LOW (ref 22–29)
Calcium: 8.7 mg/dL (ref 8.4–10.4)
Chloride: 110 meq/L — ABNORMAL HIGH (ref 98–109)
Creatinine: 1 mg/dL (ref 0.6–1.1)
EGFR: 66 mL/min/{1.73_m2} — ABNORMAL LOW
Glucose: 122 mg/dL (ref 70–140)
Potassium: 3.1 meq/L — ABNORMAL LOW (ref 3.5–5.1)
Sodium: 143 meq/L (ref 136–145)
Total Bilirubin: 1.31 mg/dL — ABNORMAL HIGH (ref 0.20–1.20)
Total Protein: 5.5 g/dL — ABNORMAL LOW (ref 6.4–8.3)

## 2015-10-11 LAB — CBC WITH DIFFERENTIAL/PLATELET
BASO%: 0 % (ref 0.0–2.0)
BASOS ABS: 0 10*3/uL (ref 0.0–0.1)
EOS ABS: 0 10*3/uL (ref 0.0–0.5)
EOS%: 0.5 % (ref 0.0–7.0)
HCT: 36.3 % (ref 34.8–46.6)
HEMOGLOBIN: 12.3 g/dL (ref 11.6–15.9)
LYMPH%: 25 % (ref 14.0–49.7)
MCH: 31.4 pg (ref 25.1–34.0)
MCHC: 33.9 g/dL (ref 31.5–36.0)
MCV: 92.6 fL (ref 79.5–101.0)
MONO#: 0.6 10*3/uL (ref 0.1–0.9)
MONO%: 10.4 % (ref 0.0–14.0)
NEUT#: 3.8 10*3/uL (ref 1.5–6.5)
NEUT%: 64.1 % (ref 38.4–76.8)
Platelets: 215 10*3/uL (ref 145–400)
RBC: 3.92 10*6/uL (ref 3.70–5.45)
RDW: 14.6 % — ABNORMAL HIGH (ref 11.2–14.5)
WBC: 6 10*3/uL (ref 3.9–10.3)
lymph#: 1.5 10*3/uL (ref 0.9–3.3)

## 2015-10-11 MED ORDER — PALONOSETRON HCL INJECTION 0.25 MG/5ML
INTRAVENOUS | Status: AC
  Filled 2015-10-11: qty 5

## 2015-10-11 MED ORDER — PALONOSETRON HCL INJECTION 0.25 MG/5ML
0.2500 mg | Freq: Once | INTRAVENOUS | Status: AC
  Administered 2015-10-11: 0.25 mg via INTRAVENOUS

## 2015-10-11 MED ORDER — NYSTATIN 100000 UNIT/ML MT SUSP: 5.0000 mL | Freq: Four times a day (QID) | OROMUCOSAL | Status: DC

## 2015-10-11 MED ORDER — SODIUM CHLORIDE 0.9 % IV SOLN
Freq: Once | INTRAVENOUS | Status: DC
  Administered 2015-10-11: 13:00:00 via INTRAVENOUS
  Filled 2015-10-11: qty 1000

## 2015-10-11 NOTE — Progress Notes (Signed)
Patient called to find out status of [facility] application. Advised patient that I faxed and mailed copy of application to [employer] on 10/04/15 and they will notify her once a decision has been made on her behalf. It may take about 4 weeks to process. Patient verbalized understanding.

## 2015-10-11 NOTE — Patient Instructions (Signed)

## 2015-10-11 NOTE — Telephone Encounter (Signed)
"  I'm scheduled for lab appointment.  I am en route, may be late.  My 60 year old mom is driving slow so I may be late."

## 2015-10-11 NOTE — Progress Notes (Addendum)
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 With the past couple of weeks she has got profound intractable nausea and vomiting. She had to be given IV fluids twice already. She was hypokalemic and she was started on oral potassium supplementation. Because of this nausea and vomiting she has not been able to eat much food. Her blood pressure has been running very low. She is currently off all her blood pressure medications. She is also on dexamethasone at 4 mg twice a day. We would like to decrease it to 4 mg once a day.  REVIEW OF SYSTEMS:   Constitutional: Denies fevers, chills or abnormal weight loss Eyes: Denies blurriness of vision Ears, nose, mouth, throat, and face: Denies mucositis or sore throat Respiratory: Denies cough, dyspnea or wheezes Cardiovascular: Denies palpitation, chest discomfort Gastrointestinal:   Intractable nausea issues Skin: Denies abnormal skin rashes Lymphatics: Denies new lymphadenopathy or easy bruising Neurological:Denies numbness, tingling or new weaknesses Behavioral/Psych: Mood is stable, no new changes  Extremities: No lower extremity edema  All other  systems were reviewed with the patient and are negative.  I have reviewed the past medical history, past surgical history, social history and family history with the patient and they are unchanged from previous note.  ALLERGIES:  is allergic to codeine; prednisone; and sulfonamide derivatives.  MEDICATIONS:  Current Outpatient Prescriptions  Medication Sig Dispense Refill  . ACCU-CHEK AVIVA PLUS test strip Use as directed 100 each 1  . acetaminophen (TYLENOL) 650 MG CR tablet Take 1,300 mg by mouth every 8 (eight) hours as needed for pain.    Marland Kitchen ALPRAZolam (XANAX) 0.5 MG tablet Take 0.25 mg by mouth 2 (two) times daily. Reported on 09/29/2015    . CIPRODEX otic suspension     . dapsone 100 MG tablet Take 1 tablet (100 mg total) by mouth daily. Take daily while receiving radiation 30 tablet 1  . Insulin Glargine (LANTUS) 100 UNIT/ML Solostar Pen Inject 20 Units into the skin daily at 10 pm. (Patient taking differently: Inject 10 Units into the skin daily at 10 pm. ) 15 mL 11  . lactose free nutrition (BOOST PLUS) LIQD Take 237 mLs by mouth daily. 30 Can 0  . levETIRAcetam (KEPPRA) 500 MG tablet Take 1 tablet (500 mg total) by mouth 2 (two) times daily. 60 tablet 5  . levothyroxine (SYNTHROID, LEVOTHROID) 50 MCG tablet Take 25 mcg by mouth daily.    Marland Kitchen LORazepam (ATIVAN) 0.5 MG tablet TAKE 1 TABLET BY MOUTH EVERY 4 TO 6 HOURS AS NEEDED FOR NAUSEA  3  . methocarbamol (ROBAXIN) 500 MG tablet Reported on 09/29/2015    . metoprolol succinate (TOPROL-XL) 25 MG 24 hr tablet     . NICOTINE STEP 1 21 MG/24HR patch PLACE 1  PATCH (21MG TOTAL) ONTO THE SKIN DAILY  0  . nystatin (MYCOSTATIN) 100000 UNIT/ML suspension Take 5 mLs (500,000 Units total) by mouth 4 (four) times daily. 60 mL 0  . ondansetron (ZOFRAN) 8 MG tablet     . oxyCODONE-acetaminophen (PERCOCET/ROXICET) 5-325 MG tablet Reported on 09/29/2015    . pantoprazole (PROTONIX) 40 MG tablet Take 1 tablet (40 mg total) by mouth daily. Switch for any  other PPI at similar dose and frequency 30 tablet 4  . potassium chloride (MICRO-K) 10 MEQ CR capsule Take 2 capsules (20 mEq total) by mouth 2 (two) times daily. 60 capsule 0  . promethazine (PHENERGAN) 25 MG tablet     . sertraline (ZOLOFT) 50 MG tablet Take 3 tablets (150 mg total) by mouth every evening. 90 tablet 0  . temozolomide (TEMODAR) 180 MG capsule Take 1 capsule (180 mg total) by mouth daily. May take on an empty stomach or at bedtime to decrease nausea & vomiting. 72 capsule 0   No current facility-administered medications for this visit.   Facility-Administered Medications Ordered in Other Visits  Medication Dose Route Frequency Provider Last Rate Last Dose  . SONAFINE emulsion 1 application  1 application Topical BID Tyler Pita, MD   1 application at 84/13/24 1517    PHYSICAL EXAMINATION: ECOG PERFORMANCE STATUS: 1 - Symptomatic but completely ambulatory  Filed Vitals:   10/11/15 1143  BP: 97/45  Pulse: 50  Temp: 97.7 F (36.5 C)  Resp: 18   Filed Weights   10/11/15 1143  Weight: 164 lb 8 oz (74.617 kg)    GENERAL:alert, no distress and comfortable SKIN: skin color, texture, turgor are normal, no rashes or significant lesions EYES: normal, Conjunctiva are pink and non-injected, sclera clear OROPHARYNX:no exudate, no erythema and lips, buccal mucosa, and tongue normal  NECK: supple, thyroid normal size, non-tender, without nodularity LYMPH:  no palpable lymphadenopathy in the cervical, axillary or inguinal LUNGS: clear to auscultation and percussion with normal breathing effort HEART: regular rate & rhythm and no murmurs and no lower extremity edema ABDOMEN:abdomen soft, non-tender and normal bowel sounds MUSCULOSKELETAL:no cyanosis of digits and no clubbing  NEURO: alert & oriented x 3 with fluent speech, no focal motor/sensory deficits EXTREMITIES: No lower extremity edema  LABORATORY DATA:  I have reviewed the data as listed   Chemistry        Component Value Date/Time   NA 143 10/11/2015 1010   NA 135 08/10/2015 0322   K 3.1* 10/11/2015 1010   K 4.6 08/10/2015 0322   CL 102 08/10/2015 0322   CO2 21* 10/11/2015 1010   CO2 26 08/10/2015 0322   BUN 16.1 10/11/2015 1010   BUN 17 08/10/2015 0322   CREATININE 1.0 10/11/2015 1010   CREATININE 0.91 08/10/2015 0322      Component Value Date/Time   CALCIUM 8.7 10/11/2015 1010   CALCIUM 8.6* 08/10/2015 0322   ALKPHOS 42 10/11/2015 1010   ALKPHOS 112 07/24/2015 1530   AST 13 10/11/2015 1010   AST 21 07/24/2015 1530   ALT 17 10/11/2015 1010   ALT 22 07/24/2015 1530   BILITOT 1.31* 10/11/2015 1010   BILITOT 0.2* 07/24/2015 1530       Lab Results  Component Value Date   WBC 6.0 10/11/2015   HGB 12.3 10/11/2015   HCT 36.3 10/11/2015   MCV 92.6 10/11/2015   PLT 215 10/11/2015   NEUTROABS 3.8 10/11/2015     ASSESSMENT & PLAN:  Glioblastoma multiforme of brain ([place]) Brain  MRI 07/24/2015: Heterogeneous enhancing right frontal lobe mass 4.7 x 3.6 x 3.3 cm with vasogenic edema, heterogeneous enhancing mid right temporal lobe mass 4.1 x 3.1 x 2.2 cm with edema and right-to-left shift and mass effect. 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 Nausea in spite of taking Zofran : We will give her IV fluids with Aloxi today. She currently has Ativan, then again, Zofran that she takes for  nausea. I'm hoping that since she completed chemoradiation her nausea would get better with watchful monitoring. 2. Decreased taste 3. Increased salivation 4. Decreased appetite 5. Constipation: Encouraged her to use Maalox 6. Metallic taste in the mouth  decrease steroid dose to 4 mg once a day  Severe dental discomfort: I'm concerned about dental abscess because it appears to be whitish changes to the gum. She does not exhibit clear signs of abscess but she definitely needs an urgent referral and consultation for evaluation of her gum disease.  Prognosis:her prognosis is guarded given multiple high risk features including subtotal resection along with lack of IDH mutation and MGMT methylation.   I would like to see her back in one month for follow-up and maintenance temozolomide.    Orders Placed This Encounter  Procedures  . Ambulatory referral to Dentistry    Referral Priority:  Routine    Referral Type:  Consultation    Referral Reason:  Specialty Services Required    Requested Specialty:  Dental General Practice    Number of Visits Requested:  1   The patient has a good understanding of the overall plan. she agrees with it. she will call with any problems that may develop before the next visit here.   Rulon Eisenmenger, MD 10/11/2015

## 2015-10-11 NOTE — Telephone Encounter (Signed)
Pt called asking if she should take oral K+ tonight because she did receive K+ in her ivf today. Shona Simpson PA in rad onc had ordered her 20 meq BID. Called pt back and lvm that the K+ she received today in IV was 20 meq, it will not hurt her to take oral K+ tonight.

## 2015-10-13 ENCOUNTER — Telehealth: Payer: Self-pay | Admitting: *Deleted

## 2015-10-13 NOTE — Telephone Encounter (Addendum)
Ms. Thurow called to report that she has tenderness in the right side of her breast, then stated it was mostly the right axillary region.  Denies any swelling of her right breast, axilla nor right arm/hand at this time.  Stated she had IV to the right arm on yesterday.  Called and left a voice  Dr. Geralyn Flash nurse to relay this information.

## 2015-10-14 ENCOUNTER — Telehealth: Payer: Self-pay | Admitting: Hematology and Oncology

## 2015-10-14 NOTE — Telephone Encounter (Signed)
Left message for patient re 3/13 f/u and mailed schedule. Also confirmed 2/13 appointment with Dr,. Kuliniski - patient was to be contacted by Dr. Ritta Slot office.

## 2015-10-16 ENCOUNTER — Ambulatory Visit (HOSPITAL_BASED_OUTPATIENT_CLINIC_OR_DEPARTMENT_OTHER): Payer: BC Managed Care – PPO

## 2015-10-16 ENCOUNTER — Encounter (HOSPITAL_COMMUNITY): Payer: Self-pay | Admitting: Dentistry

## 2015-10-16 ENCOUNTER — Ambulatory Visit (HOSPITAL_COMMUNITY): Payer: Self-pay | Admitting: Dentistry

## 2015-10-16 ENCOUNTER — Other Ambulatory Visit: Payer: Self-pay

## 2015-10-16 VITALS — BP 99/46 | HR 60 | Temp 97.8°F

## 2015-10-16 VITALS — BP 89/47 | HR 57 | Temp 97.7°F

## 2015-10-16 DIAGNOSIS — C719 Malignant neoplasm of brain, unspecified: Secondary | ICD-10-CM | POA: Diagnosis not present

## 2015-10-16 DIAGNOSIS — C712 Malignant neoplasm of temporal lobe: Secondary | ICD-10-CM

## 2015-10-16 MED ORDER — SODIUM CHLORIDE 0.9 % IV SOLN: INTRAVENOUS | Status: DC

## 2015-10-16 MED ORDER — POTASSIUM CHLORIDE IN NACL 20-0.9 MEQ/L-% IV SOLN: Freq: Once | INTRAVENOUS | Status: DC

## 2015-10-16 MED ORDER — POTASSIUM CHLORIDE 2 MEQ/ML IV SOLN
Freq: Once | INTRAVENOUS | Status: AC
  Administered 2015-10-16: 16:00:00 via INTRAVENOUS
  Filled 2015-10-16: qty 1000

## 2015-10-16 MED ORDER — AMOXICILLIN 500 MG PO CAPS: ORAL_CAPSULE | ORAL | Status: DC

## 2015-10-16 NOTE — Patient Instructions (Signed)

## 2015-10-16 NOTE — Progress Notes (Signed)
DENTAL CONSULTATION  Date of Consultation:  10/16/2015 Patient Name:   Michelle Duran Date of Birth:   1955/11/11 Medical Record Number: [id number]  VITALS: BP 89/47 mmHg  Pulse 57  Temp(Src) 97.7 F (36.5 C) (Oral)  Hypotensive reading noted today.  CHIEF COMPLAINT: Patient referred by Michelle Duran for dental consultation.  HPI: Michelle Duran is a 60 year old female recently diagnosed with a brain tumor. Patient is status post surgical resection with Michelle Duran on 08/09/2015. Patient then underwent Temodar chemotherapy with Michelle Duran and radiation therapy with Michelle Duran. The patient was subsequently found to have "gum swelling" involving the lower left quadrant molar tooth #19.  Dental consultation was requested to rule out dental infection and to provide treatment options as indicated.  Patient currently has tooth pain involving the lower left molar. The lower left molar "hurts when I push on it". Patient is pointing to tooth #19.  Patient describes a sharp, intermittent pain that lasts "sedonds only". The pain reaches an intensity of 9 out of 10. Patient denies having any spontaneous pain. Patient indicates that the tooth has been hurting her off and on for the past year. Patient indicates that the abscess or gum boil has been present for the past 2 months. The patient also indicates that the tooth has been broken for the past 13 years.  Patient also indicates that she has an upper left crown in the area of #13 that has " fallen off again". This was previously recemented approximately one to 2 years ago.  The patient denies having a primary dentist and was last seen to have several teeth pulled by Michelle Duran( ORAL SURGEON).  Patient denies any complications from those dental extractions. The patient has not been seeking regular dental care due to the cost of dental care.  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   PROBLEM LIST: Patient Active Problem  List   Diagnosis Date Noted  . Dental abscess 10/11/2015  . Thrush 10/11/2015  . Nausea without vomiting 09/29/2015  . Brain neoplasm ([place]) 08/09/2015  . 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 Depression   . Anxiety state   . Tobacco abuse   . Diabetes mellitus, iatrogenic ([place])   . Brain mass 07/24/2015  . Facial droop 07/24/2015  . Slurred speech 07/24/2015  . Hypothyroidism 06/17/2008  . GERD 06/17/2008  . IRRITABLE BOWEL SYNDROME 06/17/2008  . ANAL FISSURE, HX OF 06/17/2008  . PANIC DISORDER 11/05/2007  . MIGRAINE HEADACHE 11/05/2007  . Essential hypertension 11/05/2007  . 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 CARDIAC ARRHYTHMIA 11/05/2007  . RECTAL FISSURE 11/05/2007  . FIBROMYALGIA 11/05/2007  . HIATAL HERNIA 05/08/2001    PMH: Past Medical History  Diagnosis Date  . Gallstone   . Hypertension   . Hypothyroidism   . Mitral valve prolapse   . Fibromyalgia   . Anxiety   . Vitamin D deficiency   . Diabetes mellitus without complication ([place])   . Tobacco abuse   . Mass of adrenal gland ([place])   . Wears glasses   . Complication of anesthesia     Pt was shaking after having epidural  during c-section  . PVC's (premature ventricular contractions)     saw cardiologist Dr. Claiborne Billings > 3 years ago with echo and stress (entered 08/08/2015)  . Vertigo   . Numbness and tingling     fingers and toes  . Arthritis   . GERD (  gastroesophageal reflux disease)   . History of hiatal hernia   . Nocturia   . Family history of adverse reaction to anesthesia     mother had PONV  . Panic attacks   . Neoplasm of brain ([place])     PSH: Past Surgical History  Procedure Laterality Date  . Cesarean section    . Multiple tooth extractions    . Esophagus stretched    . Craniotomy Right 08/09/2015    Procedure: Right Frontotemporal craniotomy for tumor resection with stereotactic navigation;  Surgeon: Kevan Ny Ditty, MD;  Location: [facility];  Service: Neurosurgery;   Laterality: Right;  Right Frontotemporal craniotomy for tumor resection with stereotactic navigation    ALLERGIES: Allergies  Allergen Reactions  . Codeine     REACTION: vomiting  . Prednisone     Panic attacks  . Sulfonamide Derivatives     REACTION: nausea    MEDICATIONS: Current Outpatient Prescriptions  Medication Sig Dispense Refill  . ACCU-CHEK AVIVA PLUS test strip Use as directed 100 each 1  . acetaminophen (TYLENOL) 650 MG CR tablet Take 1,300 mg by mouth every 8 (eight) hours as needed for pain.    Marland Kitchen ALPRAZolam (XANAX) 0.5 MG tablet Take 0.25 mg by mouth 2 (two) times daily. Reported on 09/29/2015    . CIPRODEX otic suspension     . dapsone 100 MG tablet Take 1 tablet (100 mg total) by mouth daily. Take daily while receiving radiation 30 tablet 1  . Insulin Glargine (LANTUS) 100 UNIT/ML Solostar Pen Inject 20 Units into the skin daily at 10 pm. (Patient taking differently: Inject 10 Units into the skin daily at 10 pm. ) 15 mL 11  . lactose free nutrition (BOOST PLUS) LIQD Take 237 mLs by mouth daily. 30 Can 0  . levETIRAcetam (KEPPRA) 500 MG tablet Take 1 tablet (500 mg total) by mouth 2 (two) times daily. 60 tablet 5  . levothyroxine (SYNTHROID, LEVOTHROID) 50 MCG tablet Take 25 mcg by mouth daily.    Marland Kitchen LORazepam (ATIVAN) 0.5 MG tablet TAKE 1 TABLET BY MOUTH EVERY 4 TO 6 HOURS AS NEEDED FOR NAUSEA  3  . methocarbamol (ROBAXIN) 500 MG tablet Reported on 09/29/2015    . metoprolol succinate (TOPROL-XL) 25 MG 24 hr tablet     . NICOTINE STEP 1 21 MG/24HR patch PLACE 1 PATCH (21MG  TOTAL) ONTO THE SKIN DAILY  0  . nystatin (MYCOSTATIN) 100000 UNIT/ML suspension Take 5 mLs (500,000 Units total) by mouth 4 (four) times daily. 60 mL 0  . ondansetron (ZOFRAN) 8 MG tablet     . oxyCODONE-acetaminophen (PERCOCET/ROXICET) 5-325 MG tablet Reported on 09/29/2015    . pantoprazole (PROTONIX) 40 MG tablet Take 1 tablet (40 mg total) by mouth daily. Switch for any other PPI at similar dose  and frequency 30 tablet 4  . potassium chloride (MICRO-K) 10 MEQ CR capsule Take 2 capsules (20 mEq total) by mouth 2 (two) times daily. 60 capsule 0  . promethazine (PHENERGAN) 25 MG tablet     . sertraline (ZOLOFT) 50 MG tablet Take 3 tablets (150 mg total) by mouth every evening. 90 tablet 0  . temozolomide (TEMODAR) 180 MG capsule Take 1 capsule (180 mg total) by mouth daily. May take on an empty stomach or at bedtime to decrease nausea & vomiting. 72 capsule 0   No current facility-administered medications for this visit.   Facility-Administered Medications Ordered in Other Visits  Medication Dose Route Frequency Provider Last Rate Last  Dose  . SONAFINE emulsion 1 application  1 application Topical BID Tyler Pita, MD   1 application at [id number]    LABS: Lab Results  Component Value Date   WBC 6.0 10/11/2015   HGB 12.3 10/11/2015   HCT 36.3 10/11/2015   MCV 92.6 10/11/2015   PLT 215 10/11/2015      Component Value Date/Time   NA 143 10/11/2015 1010   NA 135 08/10/2015 0322   K 3.1* 10/11/2015 1010   K 4.6 08/10/2015 0322   CL 102 08/10/2015 0322   CO2 21* 10/11/2015 1010   CO2 26 08/10/2015 0322   GLUCOSE 122 10/11/2015 1010   GLUCOSE 225* 08/10/2015 0322   BUN 16.1 10/11/2015 1010   BUN 17 08/10/2015 0322   CREATININE 1.0 10/11/2015 1010   CREATININE 0.91 08/10/2015 0322   CALCIUM 8.7 10/11/2015 1010   CALCIUM 8.6* 08/10/2015 0322   GFRNONAA >60 08/10/2015 0322   GFRAA >60 08/10/2015 0322   Lab Results  Component Value Date   INR 0.99 07/24/2015   No results found for: PTT  SOCIAL HISTORY: Social History   Social History  . Marital Status: Divorced    Spouse Name: N/A  . Number of Children: 1  . Years of Education: N/A   Occupational History  . cafateria Freight forwarder    Social History Main Topics  . Smoking status: Former Smoker -- 1.00 packs/day for 41 years    Types: Cigarettes    Quit date: 07/24/2015  . Smokeless tobacco: Never Used  .  Alcohol Use: No  . Drug Use: No  . Sexual Activity: Not Currently   Other Topics Concern  . Not on file   Social History Narrative    FAMILY HISTORY: Family History  Problem Relation Age of Onset  . Hypertension Father   . Glaucoma    . Ovarian cancer Cousin   . Breast cancer Paternal Grandmother     REVIEW OF SYSTEMS: As per History of Present Illness.  DENTAL HISTORY: CHIEF COMPLAINT: Patient referred by Michelle Duran for dental consultation.  HPI: Shonia D Nothnagel is a 60 year old female recently diagnosed with a brain tumor. Patient is status post surgical resection with Michelle Duran on 08/09/2015. Patient then underwent Temodar chemotherapy with Michelle Duran and radiation therapy with Michelle Duran. The patient was subsequently found to have "gum swelling" involving the lower left quadrant molar tooth #19. A dental consultation was requested to rule out dental infection and to provide treatment options as indicated.  Patient currently has tooth pain involving the lower left molar. The lower left molar "hurts when I push on it". Patient is pointing to tooth #19.  Patient describes a sharp, intermittent pain that lasts "sedonds only". The pain reaches an intensity of 9 out of 10. Patient denies having any spontaneous pain. Patient indicates that the tooth has been hurting her off and on for the past year. Patient indicates that the abscess or gum boil has been present for the past 2 months. The patient also indicates that the tooth has been broken for the past 13 years.  Patient also indicates that she has an upper left crown in the area of #13 that has " fallen off again". This was previously recemented approximately one to 2 years ago.  The patient denies having a primary dentist and was last seen to have several teeth pulled by Michelle Duran( ORAL SURGEON).  Patient denies any complications from those dental extractions. The patient has not  been seeking regular dental care due to the  cost of dental care.  The patient also has a history of dental phobia from the age of 48.  DENTAL EXAMINATION: GENERAL: The patient is a well-developed, well-nourished female in no acute distress. HEAD AND NECK: There is no palpable submandibular lymphadenopathy. The patient denies acute TMJ symptoms. INTRAORAL EXAM: The patient has xerostomia. Patient has foamy saliva. There is a buccal abscess on the facial aspect of tooth #19. Purulence is able to be expressed from this buccal abscess. The patient has bilateral mandibular lingual tori. DENTITION: The patient is missing tooth numbers 1, 5, 16, 17, 28, 31, and 32. There is a retained root segment in the area of #13. Tooth #19 has extensive dental caries and a fractured cusps. PERIODONTAL: The patient has chronic periodontitis with plaque and calculus accumulations, selective areas of gingival recession, and mandibular anterior incipient tooth mobility. Periodontal charting was deferred secondary to patient discomfort today. DENTAL CARIES/SUBOPTIMAL RESTORATIONS: Multiple dental caries are noted as per dental charting form.  ENDODONTIC:  The patient has a history of intermittent acute pulpitis symptoms from tooth #19. No obvious periapical radiolucency is noted at the apex of tooth #19.  The patient has previous root canal therapies on tooth numbers 13, 29, and 30. The root canal therapy of tooth #13 is now exposed to the oral environment. CROWN AND BRIDGE:  The patient has multiple crown restorations on tooth numbers 18, 29, and 30. PROSTHODONTIC: There are no partial dentures. OCCLUSION: The patient has a poor occlusal scheme and end to end anterior malocclusion.  RADIOGRAPHIC INTERPRETATION: An orthopantogram was taken on 10/16/2015.   There are multiple missing teeth numbers 1, 4, 16, 17, 28, 31, and 32. There is a retained root segment in the area of #13. Dental caries are noted. Multiple missing teeth are noted. There is supra-eruption and  drifting of the unopposed teeth into the edentulous areas.  There is incipient to moderate bone loss noted. There are previous root canal therapies associated with tooth numbers 13, 29, and 30. There are root canal therapy of tooth #13 is now exposed to the oral environment. No obvious periapical radiolucencies are noted.  ASSESSMENTS: 1. Glioblastoma multiforme brain tumor-status post surgical resection 2. History of chemoradiation therapy 3. Buccal abscess area #19. 4. History of acute pulpitis 5. Dental caries 6. Retained root segment #13 7. Chronic periodontitis with bone loss 8. Gingival recession 9. Accretions 10. Incipient mandibular anterior tooth mobility 11. Poor occlusal scheme and malocclusion 12. Hypotensive blood pressure reading today.  PLAN/RECOMMENDATIONS: 1. I discussed the risks, benefits, and complications of various treatment options with the patient in relationship to her medical and dental conditions, brain tumor, and history of chemoradiation therapy. We discussed various treatment options to include no treatment, multiple extractions with alveoloplasty, pre-prosthetic surgery as indicated, periodontal therapy, dental restorations, root canal therapy, crown and bridge therapy, implant therapy, and replacement of missing teeth as indicated. We also discussed referral to an oral surgeon.  The patient currently wishes to proceed with referral to Dr. Jannette Fogo (oral surgeon) for extraction of tooth numbers 13 and 19 as indicated.  The patient will be placed amoxicillin 500 mg orally every 8 hours for the next 7 days. The patient also will present to the [facility] today for evaluation for IV therapy at the discretion of Michelle Duran due to her hypotensive blood pressure reading.  The patient will then need to follow-up with the primary dentist her choice for exam,  full series of dental radiographs, and discussion of other dental treatment needs.   2. Discussion of findings  with medical team and coordination of future medical and dental care as needed.   Lenn Cal, DDS

## 2015-10-16 NOTE — Patient Instructions (Signed)
Patient is being referred to Dr. Jannette Fogo ( ORAL SURGEON) for extraction of tooth numbers 13 and 19. In the meantime, the patient is being placed on amoxicillin 500 mg by mouth every 8 hours for the next 7 days. [facility] Patient to contact dental medicine as needed. Dr. Enrique Sack

## 2015-10-22 NOTE — Progress Notes (Addendum)
  Radiation Oncology         [phone]) [phone] ________________________________  Name: Maricella Ginyard  End of Treatment Note   ICD-9-CM ICD-10-CM    1. Glioblastoma multiforme of brain ([place]) 191.9 C71.9     DIAGNOSIS: Ms. Bowlen is a pleasant 60 year old woman with right frontal and temporal multifocal glioblastoma s/p resection      Indication for treatment:  Local Control       Radiation treatment dates:   08/29/2015-10/11/2015  Site/dose:    1. The initial enhancing tumor, peritumoral edema, plus 1 cm were treated to 44 Gy in 22 fractions. 2. The enhancing tumor plus 1 cm was boosted to 60 Gy with 8 additional fractions of 2 Gy.  Beams/energy:  1. The initial enhancing tumor, peritumoral edema, plus 1 cm were treated using helical intensity modulated radiotherapy delivering 6 megavolt photons. Image guidance was performed with megavoltage CT studies prior to each fraction. He was immobilized with a thermoplastic mask. 2. The enhancing tumor plus 1 cm was boosted using helical intensity modulated radiotherapy delivering 6 megavolt photons. Image guidance was performed with megavoltage CT studies prior to each fraction. He was immobilized with a a thermoplastic mask.  Narrative: The patient tolerated radiation treatment relatively well.  Some hair loss was noted in the treated area.  She had some issues with medication compliance and steroid taper instructions.  Plan: The patient has completed radiation treatment. The patient will return to radiation oncology clinic for routine followup in one month. I advised him to call or return sooner if he has any questions or concerns related to his recovery or treatment. ________________________________  Sheral Apley. Tammi Klippel, M.D.

## 2015-10-24 ENCOUNTER — Encounter: Payer: Self-pay | Admitting: Internal Medicine

## 2015-10-25 ENCOUNTER — Encounter: Payer: Self-pay | Admitting: Hematology and Oncology

## 2015-10-25 NOTE — Progress Notes (Signed)
Returned patient's call who had a concern about her copay for Temodar. Patient received a card in the mail and wasn't sure what to do with it from [place]. I called [facility] to confirm she was approved for copay assitance. I spoke with Zenia Resides who informed me she was and gave me her account number. He states she can use that number to make her payment to [employer]. Called [facility] and the representative is out til tomorrow. Will follow up tomorrow to get further instructions for patient as she is confused. Met with Gerald Stabs in pharmacy who confirmed she has assistance for Temodar through [employer] for one year.

## 2015-10-26 ENCOUNTER — Encounter: Payer: Self-pay | Admitting: Hematology and Oncology

## 2015-10-26 NOTE — Progress Notes (Signed)
Clydene Laming with [facility] to find out if they billed the $100 that the patient states she was billed to [facility] which he had copay assistance through. Marjory Lies investigated and called me back and stated he talked with his copay team who states they hadn't billed [place] yet but would take care of it and send the invoice to [facility]. I called patient to advise her that she does not owe the $100 and her Temodar is still free under [employer] now for 1 year which Gerald Stabs in pharmacy initiated this assistance for her. Patient verbalized understanding and asked about payment plans for her CC bills. I advised her that she could contact the number on the bill when she is ready to set up those arrangements. Patient thanked me for all of my help.

## 2015-10-27 ENCOUNTER — Telehealth: Payer: Self-pay | Admitting: Radiation Oncology

## 2015-10-27 NOTE — Telephone Encounter (Signed)
Received message from patient requesting refill of potassium. Phoned patient back to discuss further. No answer. Left message. Sent Shona Simpson, NP an inbox request for a refill. Last labs 2/8. Will phone patient again on Monday.

## 2015-10-30 ENCOUNTER — Telehealth: Payer: Self-pay | Admitting: Radiation Oncology

## 2015-10-30 ENCOUNTER — Other Ambulatory Visit: Payer: Self-pay | Admitting: Radiation Oncology

## 2015-10-30 DIAGNOSIS — E876 Hypokalemia: Secondary | ICD-10-CM

## 2015-10-30 MED ORDER — POTASSIUM CHLORIDE ER 10 MEQ PO CPCR: 20.0000 meq | ORAL_CAPSULE | Freq: Two times a day (BID) | ORAL | Status: DC

## 2015-10-30 NOTE — Telephone Encounter (Signed)
Opened in error

## 2015-10-30 NOTE — Telephone Encounter (Signed)
Thanks for putting in her order!

## 2015-10-30 NOTE — Telephone Encounter (Signed)
[facility] [address] [facility] [place] [facility]'s order. Phoned patient to explain. No answer. Left message requesting return call. Patient phoned back immediately. Instructed patient to pick up potassium and take as directed. Explained she should present on 11/09/2014 at 3:30 pm for labs then, come down to see Dr. Tammi Klippel. Patient verbalized understanding. Patient reports her taste is slowly returning. Goes onto say she is scheduled to have two abscessed teeth extracted this week.

## 2015-10-31 ENCOUNTER — Emergency Department (HOSPITAL_COMMUNITY): Payer: BC Managed Care – PPO

## 2015-10-31 ENCOUNTER — Encounter (HOSPITAL_COMMUNITY): Payer: Self-pay

## 2015-10-31 ENCOUNTER — Emergency Department (HOSPITAL_COMMUNITY)
Admission: EM | Admit: 2015-10-31 | Discharge: 2015-10-31 | Disposition: A | Discharge status: Home / Self Care | Payer: BC Managed Care – PPO | Attending: Emergency Medicine | Admitting: Emergency Medicine

## 2015-10-31 DIAGNOSIS — Z79899 Other long term (current) drug therapy: Secondary | ICD-10-CM | POA: Diagnosis not present

## 2015-10-31 DIAGNOSIS — E039 Hypothyroidism, unspecified: Secondary | ICD-10-CM | POA: Insufficient documentation

## 2015-10-31 DIAGNOSIS — Z85841 Personal history of malignant neoplasm of brain: Secondary | ICD-10-CM | POA: Diagnosis not present

## 2015-10-31 DIAGNOSIS — E119 Type 2 diabetes mellitus without complications: Secondary | ICD-10-CM | POA: Diagnosis not present

## 2015-10-31 DIAGNOSIS — I10 Essential (primary) hypertension: Secondary | ICD-10-CM | POA: Diagnosis not present

## 2015-10-31 DIAGNOSIS — Z792 Long term (current) use of antibiotics: Secondary | ICD-10-CM | POA: Insufficient documentation

## 2015-10-31 DIAGNOSIS — M1990 Unspecified osteoarthritis, unspecified site: Secondary | ICD-10-CM | POA: Diagnosis not present

## 2015-10-31 DIAGNOSIS — C719 Malignant neoplasm of brain, unspecified: Secondary | ICD-10-CM | POA: Diagnosis not present

## 2015-10-31 DIAGNOSIS — Z7952 Long term (current) use of systemic steroids: Secondary | ICD-10-CM | POA: Diagnosis not present

## 2015-10-31 DIAGNOSIS — K219 Gastro-esophageal reflux disease without esophagitis: Secondary | ICD-10-CM | POA: Diagnosis not present

## 2015-10-31 DIAGNOSIS — Z87891 Personal history of nicotine dependence: Secondary | ICD-10-CM | POA: Diagnosis not present

## 2015-10-31 DIAGNOSIS — R4789 Other speech disturbances: Secondary | ICD-10-CM | POA: Insufficient documentation

## 2015-10-31 DIAGNOSIS — R29810 Facial weakness: Secondary | ICD-10-CM | POA: Diagnosis present

## 2015-10-31 DIAGNOSIS — F1310 Sedative, hypnotic or anxiolytic abuse, uncomplicated: Secondary | ICD-10-CM | POA: Insufficient documentation

## 2015-10-31 DIAGNOSIS — Z794 Long term (current) use of insulin: Secondary | ICD-10-CM | POA: Diagnosis not present

## 2015-10-31 DIAGNOSIS — G40109 Localization-related (focal) (partial) symptomatic epilepsy and epileptic syndromes with simple partial seizures, not intractable, without status epilepticus: Secondary | ICD-10-CM

## 2015-10-31 DIAGNOSIS — G40909 Epilepsy, unspecified, not intractable, without status epilepticus: Secondary | ICD-10-CM | POA: Diagnosis not present

## 2015-10-31 DIAGNOSIS — F410 Panic disorder [episodic paroxysmal anxiety] without agoraphobia: Secondary | ICD-10-CM | POA: Insufficient documentation

## 2015-10-31 LAB — PROTIME-INR
INR: 1.14 (ref 0.00–1.49)
Prothrombin Time: 14.8 seconds (ref 11.6–15.2)

## 2015-10-31 LAB — I-STAT CHEM 8, ED
BUN: 14 mg/dL (ref 6–20)
CALCIUM ION: 0.97 mmol/L — AB (ref 1.12–1.23)
CREATININE: 0.9 mg/dL (ref 0.44–1.00)
Chloride: 106 mmol/L (ref 101–111)
Glucose, Bld: 116 mg/dL — ABNORMAL HIGH (ref 65–99)
HEMATOCRIT: 33 % — AB (ref 36.0–46.0)
HEMOGLOBIN: 11.2 g/dL — AB (ref 12.0–15.0)
Potassium: 3.2 mmol/L — ABNORMAL LOW (ref 3.5–5.1)
Sodium: 146 mmol/L — ABNORMAL HIGH (ref 135–145)
TCO2: 24 mmol/L (ref 0–100)

## 2015-10-31 LAB — DIFFERENTIAL
BASOS ABS: 0 10*3/uL (ref 0.0–0.1)
BASOS PCT: 0 %
EOS ABS: 0 10*3/uL (ref 0.0–0.7)
EOS PCT: 0 %
Lymphocytes Relative: 23 %
Lymphs Abs: 1 10*3/uL (ref 0.7–4.0)
MONO ABS: 0.3 10*3/uL (ref 0.1–1.0)
MONOS PCT: 8 %
Neutro Abs: 3 10*3/uL (ref 1.7–7.7)
Neutrophils Relative %: 69 %

## 2015-10-31 LAB — CBC
HEMATOCRIT: 33.3 % — AB (ref 36.0–46.0)
Hemoglobin: 11 g/dL — ABNORMAL LOW (ref 12.0–15.0)
MCH: 31.4 pg (ref 26.0–34.0)
MCHC: 33 g/dL (ref 30.0–36.0)
MCV: 95.1 fL (ref 78.0–100.0)
Platelets: 214 10*3/uL (ref 150–400)
RBC: 3.5 MIL/uL — ABNORMAL LOW (ref 3.87–5.11)
RDW: 15.3 % (ref 11.5–15.5)
WBC: 4.3 10*3/uL (ref 4.0–10.5)

## 2015-10-31 LAB — COMPREHENSIVE METABOLIC PANEL
ALT: 23 U/L (ref 14–54)
ANION GAP: 14 (ref 5–15)
AST: 17 U/L (ref 15–41)
Albumin: 3.2 g/dL — ABNORMAL LOW (ref 3.5–5.0)
Alkaline Phosphatase: 36 U/L — ABNORMAL LOW (ref 38–126)
BILIRUBIN TOTAL: 0.5 mg/dL (ref 0.3–1.2)
BUN: 14 mg/dL (ref 6–20)
CHLORIDE: 110 mmol/L (ref 101–111)
CO2: 23 mmol/L (ref 22–32)
Calcium: 7.7 mg/dL — ABNORMAL LOW (ref 8.9–10.3)
Creatinine, Ser: 1.06 mg/dL — ABNORMAL HIGH (ref 0.44–1.00)
GFR, EST NON AFRICAN AMERICAN: 57 mL/min — AB (ref >60)
Glucose, Bld: 120 mg/dL — ABNORMAL HIGH (ref 65–99)
POTASSIUM: 3.3 mmol/L — AB (ref 3.5–5.1)
Sodium: 147 mmol/L — ABNORMAL HIGH (ref 135–145)
TOTAL PROTEIN: 5.1 g/dL — AB (ref 6.5–8.1)

## 2015-10-31 LAB — URINALYSIS, ROUTINE W REFLEX MICROSCOPIC
Bilirubin Urine: NEGATIVE
GLUCOSE, UA: NEGATIVE mg/dL
HGB URINE DIPSTICK: NEGATIVE
Ketones, ur: NEGATIVE mg/dL
LEUKOCYTES UA: NEGATIVE
Nitrite: NEGATIVE
PROTEIN: NEGATIVE mg/dL
SPECIFIC GRAVITY, URINE: 1.022 (ref 1.005–1.030)
pH: 5.5 (ref 5.0–8.0)

## 2015-10-31 LAB — RAPID URINE DRUG SCREEN, HOSP PERFORMED
Amphetamines: NOT DETECTED
BENZODIAZEPINES: POSITIVE — AB
Barbiturates: NOT DETECTED
COCAINE: NOT DETECTED
Opiates: NOT DETECTED
Tetrahydrocannabinol: NOT DETECTED

## 2015-10-31 LAB — I-STAT TROPONIN, ED: TROPONIN I, POC: 0 ng/mL (ref 0.00–0.08)

## 2015-10-31 LAB — APTT: APTT: 25 s (ref 24–37)

## 2015-10-31 MED ORDER — LEVETIRACETAM 500 MG/5ML IV SOLN
1500.0000 mg | Freq: Once | INTRAVENOUS | Status: AC
  Administered 2015-10-31: 1500 mg via INTRAVENOUS
  Filled 2015-10-31: qty 15

## 2015-10-31 MED ORDER — GADOBENATE DIMEGLUMINE 529 MG/ML IV SOLN
15.0000 mL | Freq: Once | INTRAVENOUS | Status: AC | PRN
  Administered 2015-10-31: 15 mL via INTRAVENOUS

## 2015-10-31 NOTE — ED Notes (Signed)
Pt arrived via EMS c/o left sided facial droop, pt lives at home with family.  Pt had 2 brain tumors removed in December.  Pt on prophylactic seizure meds. A&O, left sided facial droop with facial spasms and twitching. LKN 1650.  EMS reports unclear from speaking with family if facial droop is baseline after December surgery.  CBG 151.

## 2015-10-31 NOTE — ED Provider Notes (Signed)
CSN: [id number]     Arrival date & time 10/31/15  1721 History   First MD Initiated Contact with Patient 10/31/15 1724     Chief Complaint  Patient presents with  . Facial Droop     (Consider location/radiation/quality/duration/timing/severity/associated sxs/prior Treatment) Patient is a 60 y.o. female presenting with Acute Neurological Problem. The history is provided by the patient and medical records.  Cerebrovascular Accident This is a new problem. The current episode started less than 1 hour ago. The problem occurs constantly. The problem has not changed since onset.Pertinent negatives include no chest pain, no abdominal pain, no headaches and no shortness of breath. Nothing aggravates the symptoms. Nothing relieves the symptoms. She has tried nothing for the symptoms. The treatment provided no relief.   60 yo F With a chief complaint of left-sided facial droop. This happened about 30 minutes ago when she was walking with her friends in a grocery store. Had some initial tremors to the left side of her face and was left with the droop. Patient recently had a craniectomy about 20 days ago for brain cancer. Has had some spasm and tremors with this. Has had no facial droop until just recently. Denies head injury denies loss consciousness. Denies difficulty with speech.  Past Medical History  Diagnosis Date  . Gallstone   . Hypertension   . Hypothyroidism   . Mitral valve prolapse   . Fibromyalgia   . Anxiety   . Vitamin D deficiency   . Diabetes mellitus without complication ([place])   . Tobacco abuse   . Mass of adrenal gland ([place])   . Wears glasses   . Complication of anesthesia     Pt was shaking after having epidural  during c-section  . PVC's (premature ventricular contractions)     saw cardiologist Dr. Claiborne Billings > 3 years ago with echo and stress (entered 08/08/2015)  . Vertigo   . Numbness and tingling     fingers and toes  . Arthritis   . GERD (gastroesophageal reflux disease)    . History of hiatal hernia   . Nocturia   . Family history of adverse reaction to anesthesia     mother had PONV  . Panic attacks   . Neoplasm of brain [facility])    Past Surgical History  Procedure Laterality Date  . Cesarean section    . Multiple tooth extractions    . Esophagus stretched    . Craniotomy Right 08/09/2015    Procedure: Right Frontotemporal craniotomy for tumor resection with stereotactic navigation;  Surgeon: Kevan Ny Ditty, MD;  Location: [place] [facility];  Service: Neurosurgery;  Laterality: Right;  Right Frontotemporal craniotomy for tumor resection with stereotactic navigation   Family History  Problem Relation Age of Onset  . Hypertension Father   . Glaucoma    . Ovarian cancer Cousin   . Breast cancer Paternal Grandmother    Social History  Substance Use Topics  . Smoking status: Former Smoker -- 1.00 packs/day for 41 years    Types: Cigarettes    Quit date: 07/24/2015  . Smokeless tobacco: Never Used  . Alcohol Use: No   OB History    No data available     Review of Systems  Constitutional: Negative for fever and chills.  HENT: Negative for congestion and rhinorrhea.   Eyes: Negative for redness and visual disturbance.  Respiratory: Negative for shortness of breath and wheezing.   Cardiovascular: Negative for chest pain and palpitations.  Gastrointestinal: Negative for nausea,  vomiting and abdominal pain.  Genitourinary: Negative for dysuria and urgency.  Musculoskeletal: Negative for myalgias and arthralgias.  Skin: Negative for pallor and wound.  Neurological: Positive for tremors, facial asymmetry, speech difficulty and weakness. Negative for dizziness and headaches.      Allergies  Codeine; Prednisone; and Sulfonamide derivatives  Home Medications   Prior to Admission medications   Medication Sig Start Date End Date Taking? Authorizing Provider  acetaminophen (TYLENOL) 650 MG CR tablet Take 1,300 mg by mouth every 8 (eight) hours as  needed for pain.   Yes Historical Provider, MD  amoxicillin (AMOXIL) 500 MG capsule Take one capsule by mouth every 8 hours until all gone. Patient taking differently: Take 500 mg by mouth 2 (two) times daily. Take one capsule by mouth every 8 hours until all gone. 10/16/15  Yes Lenn Cal, DDS  dexamethasone (DECADRON) 4 MG tablet Take 2 mg by mouth 2 (two) times daily. 08/01/15  Yes Historical Provider, MD  Insulin Glargine (LANTUS) 100 UNIT/ML Solostar Pen Inject 20 Units into the skin daily at 10 pm. Patient taking differently: Inject 8 Units into the skin daily at 10 pm.  07/27/15  Yes Allie Bossier, MD  levETIRAcetam (KEPPRA) 500 MG tablet Take 1 tablet (500 mg total) by mouth 2 (two) times daily. 09/13/15  Yes Tyler Pita, MD  LORazepam (ATIVAN) 0.5 MG tablet TAKE 1 TABLET BY MOUTH EVERY 4 TO 6 HOURS AS NEEDED FOR NAUSEA 09/27/15  Yes Historical Provider, MD  oxyCODONE-acetaminophen (PERCOCET/ROXICET) 5-325 MG tablet Take 1-2 tablets by mouth every 4 (four) hours as needed for moderate pain or severe pain. Reported on 09/29/2015 08/11/15  Yes Historical Provider, MD  pantoprazole (PROTONIX) 40 MG tablet Take 1 tablet (40 mg total) by mouth daily. Switch for any other PPI at similar dose and frequency 09/20/15  Yes Hayden Pedro, PA-C  potassium chloride (MICRO-K) 10 MEQ CR capsule Take 2 capsules (20 mEq total) by mouth 2 (two) times daily. 10/30/15  Yes Hayden Pedro, PA-C  sertraline (ZOLOFT) 100 MG tablet Take 150 mg by mouth at bedtime.   Yes Historical Provider, MD  SYNTHROID 25 MCG tablet Take 12.5 mcg by mouth daily before breakfast.  10/13/15  Yes Historical Provider, MD  dapsone 100 MG tablet Take 1 tablet (100 mg total) by mouth daily. Take daily while receiving radiation Patient not taking: Reported on 10/31/2015 09/08/15   Nicholas Lose, MD  lactose free nutrition (BOOST PLUS) LIQD Take 237 mLs by mouth daily. Patient not taking: Reported on 10/31/2015 07/27/15    Allie Bossier, MD  nystatin (MYCOSTATIN) 100000 UNIT/ML suspension Take 5 mLs (500,000 Units total) by mouth 4 (four) times daily. Patient not taking: Reported on 10/31/2015 10/11/15   Nicholas Lose, MD  sertraline (ZOLOFT) 50 MG tablet Take 3 tablets (150 mg total) by mouth every evening. Patient not taking: Reported on 10/31/2015 07/27/15   Allie Bossier, MD  temozolomide [facility]) 180 MG capsule Take 1 capsule (180 mg total) by mouth daily. May take on an empty stomach or at bedtime to decrease nausea & vomiting. Patient not taking: Reported on 10/31/2015 10/02/15   Nicholas Lose, MD   BP 132/73 mmHg  Pulse 48  Temp(Src) 98.4 F (36.9 C) (Oral)  Resp 13  SpO2 98% Physical Exam  Constitutional: She is oriented to person, place, and time. She appears well-developed and well-nourished. No distress.  HENT:  Head: Normocephalic and atraumatic.  Eyes: EOM are normal. Pupils are equal,  round, and reactive to light.  Neck: Normal range of motion. Neck supple.  Cardiovascular: Normal rate and regular rhythm.  Exam reveals no gallop and no friction rub.   No murmur heard. Pulmonary/Chest: Effort normal. She has no wheezes. She has no rales.  Abdominal: Soft. She exhibits no distension. There is no tenderness.  Musculoskeletal: She exhibits no edema or tenderness.  Neurological: She is alert and oriented to person, place, and time. She has normal strength. No sensory deficit. She displays a negative Romberg sign. Coordination and gait normal. GCS eye subscore is 4. GCS verbal subscore is 5. GCS motor subscore is 6. She displays no Babinski's sign on the right side. She displays no Babinski's sign on the left side.  Reflex Scores:      Tricep reflexes are 2+ on the right side and 2+ on the left side.      Bicep reflexes are 2+ on the right side and 2+ on the left side.      Brachioradialis reflexes are 2+ on the right side and 2+ on the left side.      Patellar reflexes are 2+ on the right side and 2+  on the left side.      Achilles reflexes are 2+ on the right side and 2+ on the left side. Left sided facial droop, intention tremor  Skin: Skin is warm and dry. She is not diaphoretic.  Psychiatric: She has a normal mood and affect. Her behavior is normal.  Nursing note and vitals reviewed.   ED Course  Procedures (including critical care time) Labs Review Labs Reviewed  CBC - Abnormal; Notable for the following:    RBC 3.50 (*)    Hemoglobin 11.0 (*)    HCT 33.3 (*)    All other components within normal limits  COMPREHENSIVE METABOLIC PANEL - Abnormal; Notable for the following:    Sodium 147 (*)    Potassium 3.3 (*)    Glucose, Bld 120 (*)    Creatinine, Ser 1.06 (*)    Calcium 7.7 (*)    Total Protein 5.1 (*)    Albumin 3.2 (*)    Alkaline Phosphatase 36 (*)    GFR calc non Af Amer 57 (*)    All other components within normal limits  URINE RAPID DRUG SCREEN, HOSP PERFORMED - Abnormal; Notable for the following:    Benzodiazepines POSITIVE (*)    All other components within normal limits  I-STAT CHEM 8, ED - Abnormal; Notable for the following:    Sodium 146 (*)    Potassium 3.2 (*)    Glucose, Bld 116 (*)    Calcium, Ion 0.97 (*)    Hemoglobin 11.2 (*)    HCT 33.0 (*)    All other components within normal limits  PROTIME-INR  APTT  DIFFERENTIAL  URINALYSIS, ROUTINE W REFLEX MICROSCOPIC (NOT AT [facility])  ETHANOL  Randolm Idol, ED    Imaging Review Mr Jeri Cos Wo Contrast  10/31/2015  CLINICAL DATA:  Status post 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 Recent seizure. History of hypertension, diabetes. EXAM: MRI HEAD WITHOUT AND WITH CONTRAST TECHNIQUE: Multiplanar, multiecho pulse sequences of the brain and surrounding structures were obtained without and with intravenous contrast. CONTRAST:  59mL MULTIHANCE GADOBENATE DIMEGLUMINE 529 MG/ML IV SOLN COMPARISON:  MRI of the brain August 30, 2015 FINDINGS: Thick  irregular enhancement of RIGHT temporal lobe resection cavity, dominant component measuring 4.3 x 3.7 cm, previously 3.3 x 1.9  cm. Second RIGHT frontal lobe resection cavity shows markedly thickened nodular enhancement, progressed from prior examination, in total measuring 3 x 3.3 cm, previously 2.5 x 2.9 cm. RIGHT mesial frontal lobe nodule was sub cm, now 11 mm. In addition, new 9 mm LEFT mesial frontal lobe nodule, and multiple new subcentimeter enhancing nodules in the bifrontal lobes. Extensive FLAIR T2 hyperintense signal surrounding the resection cavities and the new masses, with additional slightly more conspicuous subcentimeter foci of FLAIR T2 hyperintense signal LEFT frontoparietal deep white matter. Reduced diffusion associated with the nodular enhancement without reduced diffusion to suggest acute ischemia. No midline shift. No hydrocephalus. No abnormal extra-axial fluid collections. Thickened RIGHT cerebrum dural enhancement consistent with recent RIGHT craniotomy. Normal major intracranial vascular flow voids present skull base. Ocular globes and orbital contents are unremarkable. Increasing RIGHT mastoid effusion. Paranasal sinus are well aerated. Ocular globes and orbital contents are normal. IMPRESSION: Marked interval progression of multifocal GBM, with recurrent disease along the margin of the RIGHT frontal and RIGHT temporal lobe resection cavities. New bifrontal lobe small masses. Extensive abnormal FLAIR signal surrounding the enhancing masses suggesting a component of nonenhancing tumor. In addition, more conspicuous non nonenhancing abnormal signal in the LEFT frontoparietal white matter concerning for early tumor. Acute findings discussed with and reconfirmed by Dr.Kaly Mcquary on 10/31/2015 at 9:35 pm. Electronically Signed   By: Elon Alas M.D.   On: 10/31/2015 21:38   I have personally reviewed and evaluated these images and lab results as part of my medical decision-making.   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 This happened acutely about 30 minutes ago. Understand the patient is not a candidate for TPA with recent craniectomy however with other alternative interventions available we'll make her a code stroke.  Patient was seen by neurology. Dr. Peggye Ley, he feels that this is likely to be a simple partial seizure. He recommended an MRI to rule out acute stroke. MRI was negative for stroke or, complication from neurosurgery.  However there was concern for rapid worsening of her cancer.  This was discussed with radiology.   Discussed with patient, she will call her oncologist tomorrow. She will double her keppra to 1g bid.   10:41 PM:  I have discussed the diagnosis/risks/treatment options with the patient and believe the pt to be eligible for discharge home to follow-up with PCP. We also discussed returning to the ED immediately if new or worsening sx occur. We discussed the sx which are most concerning (e.g., sudden worsening pain, fever, inability to tolerate by mouth) that necessitate immediate return. Medications administered to the patient during their visit and any new prescriptions provided to the patient are listed below.  Medications given during this visit Medications  levETIRAcetam (KEPPRA) 1,500 mg in sodium chloride 0.9 % 100 mL IVPB (1,500 mg Intravenous New Bag/Given 10/31/15 1918)  gadobenate dimeglumine (MULTIHANCE) injection 15 mL (15 mLs Intravenous Contrast Given 10/31/15 2032)    New  Prescriptions   No medications on file  The patient appears reasonably screen and/or stabilized for discharge and I doubt any other medical condition or other [facility] requiring further screening, evaluation, or treatment in the ED at this time prior to discharge.      Deno Etienne, DO 10/31/15 2241

## 2015-10-31 NOTE — Discharge Instructions (Signed)

## 2015-10-31 NOTE — Consult Note (Signed)
Requesting Physician: Dr.  Tyrone Nine    Reason for consultation: Stroke code  HPI:                                                                                                                                         Michelle Duran is an 60 y.o. female patient recently diagnosed with right frontal GBM 07/2015, status post resection, radiation and chemotherapy, on Keppra 500 mg twice a day for seizure prophylaxis. She had 1 other seizure previously. Today when she was a passenger in a car, she noted twitching in the left face and also convulsive activity in the left upper extremity which she cannot control. She did not lose any consciousness she was looking at her arm and could not control it. This went on for 10 minutes following this she will brought into the ER Doctor about 45 Minutes Prior to Presentation in the ER. At this time she denies any new neurological symptoms.  She is also on Decadron 2 mg twice a day   Past Medical History: Past Medical History  Diagnosis Date  . Gallstone   . Hypertension   . Hypothyroidism   . Mitral valve prolapse   . Fibromyalgia   . Anxiety   . Vitamin D deficiency   . Diabetes mellitus without complication ([place])   . Tobacco abuse   . Mass of adrenal gland ([place])   . Wears glasses   . Complication of anesthesia     Pt was shaking after having epidural  during c-section  . PVC's (premature ventricular contractions)     saw cardiologist Dr. Claiborne Billings > 3 years ago with echo and stress (entered 08/08/2015)  . Vertigo   . Numbness and tingling     fingers and toes  . Arthritis   . GERD (gastroesophageal reflux disease)   . History of hiatal hernia   . Nocturia   . Family history of adverse reaction to anesthesia     mother had PONV  . Panic attacks   . Neoplasm of brain [facility])     Past Surgical History  Procedure Laterality Date  . Cesarean section    . Multiple tooth extractions    . Esophagus stretched    . Craniotomy Right 08/09/2015   Procedure: Right Frontotemporal craniotomy for tumor resection with stereotactic navigation;  Surgeon: Kevan Ny Ditty, MD;  Location: [place] [facility];  Service: Neurosurgery;  Laterality: Right;  Right Frontotemporal craniotomy for tumor resection with stereotactic navigation    Family History: Family History  Problem Relation Age of Onset  . Hypertension Father   . Glaucoma    . Ovarian cancer Cousin   . Breast cancer Paternal Grandmother     Social History:   reports that she quit smoking about 3 months ago. Her smoking use included Cigarettes. She has a 41 pack-year smoking history. She has never used smokeless tobacco. She  reports that she does not drink alcohol or use illicit drugs.  Allergies:  Allergies  Allergen Reactions  . Codeine     REACTION: vomiting  . Prednisone     Panic attacks  . Sulfonamide Derivatives     REACTION: nausea     Medications:                                                                                                                         Current facility-administered medications:  .  levETIRAcetam (KEPPRA) 1,500 mg in sodium chloride 0.9 % 100 mL IVPB, 1,500 mg, Intravenous, Once, Cordai Rodrigue Fuller Mandril, MD  Current outpatient prescriptions:  .  ACCU-CHEK AVIVA PLUS test strip, Use as directed, Disp: 100 each, Rfl: 1 .  acetaminophen (TYLENOL) 650 MG CR tablet, Take 1,300 mg by mouth every 8 (eight) hours as needed for pain., Disp: , Rfl:  .  ALPRAZolam (XANAX) 0.5 MG tablet, Take 0.25 mg by mouth 2 (two) times daily. Reported on 09/29/2015, Disp: , Rfl:  .  amoxicillin (AMOXIL) 500 MG capsule, Take one capsule by mouth every 8 hours until all gone., Disp: 22 capsule, Rfl: 0 .  CIPRODEX otic suspension, , Disp: , Rfl:  .  dapsone 100 MG tablet, Take 1 tablet (100 mg total) by mouth daily. Take daily while receiving radiation, Disp: 30 tablet, Rfl: 1 .  Insulin Glargine (LANTUS) 100 UNIT/ML Solostar Pen, Inject 20 Units into the skin  daily at 10 pm. (Patient taking differently: Inject 10 Units into the skin daily at 10 pm. ), Disp: 15 mL, Rfl: 11 .  lactose free nutrition (BOOST PLUS) LIQD, Take 237 mLs by mouth daily., Disp: 30 Can, Rfl: 0 .  levETIRAcetam (KEPPRA) 500 MG tablet, Take 1 tablet (500 mg total) by mouth 2 (two) times daily., Disp: 60 tablet, Rfl: 5 .  levothyroxine (SYNTHROID, LEVOTHROID) 50 MCG tablet, Take 25 mcg by mouth daily., Disp: , Rfl:  .  LORazepam (ATIVAN) 0.5 MG tablet, TAKE 1 TABLET BY MOUTH EVERY 4 TO 6 HOURS AS NEEDED FOR NAUSEA, Disp: , Rfl: 3 .  methocarbamol (ROBAXIN) 500 MG tablet, Reported on 09/29/2015, Disp: , Rfl:  .  metoprolol succinate (TOPROL-XL) 25 MG 24 hr tablet, , Disp: , Rfl:  .  NICOTINE STEP 1 21 MG/24HR patch, PLACE 1 PATCH (21MG  TOTAL) ONTO THE SKIN DAILY, Disp: , Rfl: 0 .  nystatin (MYCOSTATIN) 100000 UNIT/ML suspension, Take 5 mLs (500,000 Units total) by mouth 4 (four) times daily., Disp: 60 mL, Rfl: 0 .  ondansetron (ZOFRAN) 8 MG tablet, , Disp: , Rfl:  .  oxyCODONE-acetaminophen (PERCOCET/ROXICET) 5-325 MG tablet, Reported on 09/29/2015, Disp: , Rfl:  .  pantoprazole (PROTONIX) 40 MG tablet, Take 1 tablet (40 mg total) by mouth daily. Switch for any other PPI at similar dose and frequency, Disp: 30 tablet, Rfl: 4 .  potassium chloride (MICRO-K) 10 MEQ CR capsule, Take 2 capsules (20 mEq total) by mouth 2 (two) times  daily., Disp: 60 capsule, Rfl: 0 .  promethazine (PHENERGAN) 25 MG tablet, , Disp: , Rfl:  .  sertraline (ZOLOFT) 50 MG tablet, Take 3 tablets (150 mg total) by mouth every evening., Disp: 90 tablet, Rfl: 0 .  temozolomide (TEMODAR) 180 MG capsule, Take 1 capsule (180 mg total) by mouth daily. May take on an empty stomach or at bedtime to decrease nausea & vomiting., Disp: 72 capsule, Rfl: 0  Facility-Administered Medications Ordered in Other Encounters:  .  SONAFINE emulsion 1 application, 1 application, Topical, BID, Tyler Pita, MD, 1 application at  [id number]   ROS:                                                                                                                                       History obtained from the patient  General ROS: negative for - chills, fatigue, fever, night sweats, weight gain or weight loss Psychological ROS: negative for - behavioral disorder, hallucinations, memory difficulties, mood swings or suicidal ideation Ophthalmic ROS: negative for - blurry vision, double vision, eye pain or loss of vision ENT ROS: negative for - epistaxis, nasal discharge, oral lesions, sore throat, tinnitus or vertigo Allergy and Immunology ROS: negative for - hives or itchy/watery eyes Hematological and Lymphatic ROS: negative for - bleeding problems, bruising or swollen lymph nodes Endocrine ROS: negative for - galactorrhea, hair pattern changes, polydipsia/polyuria or temperature intolerance Respiratory ROS: negative for - cough, hemoptysis, shortness of breath or wheezing Cardiovascular ROS: negative for - chest pain, dyspnea on exertion, edema or irregular heartbeat Gastrointestinal ROS: negative for - abdominal pain, diarrhea, hematemesis, nausea/vomiting or stool incontinence Genito-Urinary ROS: negative for - dysuria, hematuria, incontinence or urinary frequency/urgency Musculoskeletal ROS: negative for - joint swelling or muscular weakness Neurological ROS: as noted in HPI Dermatological ROS: negative for rash and skin lesion changes  Neurologic Examination:                                                                                                      SpO2 99 %.  Evaluation of higher integrative functions including: Level of alertness: Alert,  Oriented to time, place and person Speech: fluent, no evidence of dysarthria or aphasia noted.  Test the following cranial nerves: 2-12 grossly intact Motor examination: Normal tone, bulk, full 5/5 motor strength in all 4 extremities Examination of sensation :  Normal and symmetric sensation to pinprick in all 4 extremities and on face Examination of deep tendon reflexes: 2+, normal and  symmetric in all extremities, normal plantars bilaterally Test coordination: Normal finger nose testing, with no evidence of limb appendicular ataxia or abnormal involuntary movements or tremors noted.  Gait: Deferred   Lab Results: Basic Metabolic Panel:  Recent Labs Lab 10/31/15 1749  NA 146*  K 3.2*  CL 106  GLUCOSE 116*  BUN 14  CREATININE 0.90    Liver Function Tests: No results for input(s): AST, ALT, ALKPHOS, BILITOT, PROT, ALBUMIN in the last 168 hours. No results for input(s): LIPASE, AMYLASE in the last 168 hours. No results for input(s): AMMONIA in the last 168 hours.  CBC:  Recent Labs Lab 10/31/15 1749  HGB 11.2*  HCT 33.0*    Cardiac Enzymes: No results for input(s): CKTOTAL, CKMB, CKMBINDEX, TROPONINI in the last 168 hours.  Lipid Panel: No results for input(s): CHOL, TRIG, HDL, CHOLHDL, VLDL, LDLCALC in the last 168 hours.  CBG: No results for input(s): GLUCAP in the last 168 hours.  Microbiology: Results for orders placed or performed during the hospital encounter of 08/09/15  MRSA PCR Screening     Status: None   Collection Time: 08/09/15  1:58 PM  Result Value Ref Range Status   MRSA by PCR NEGATIVE NEGATIVE Final    Comment:        The GeneXpert MRSA Assay (FDA approved for NASAL specimens only), is one component of a comprehensive MRSA colonization surveillance program. It is not intended to diagnose MRSA infection nor to guide or monitor treatment for MRSA infections.      Imaging: No results found.  Assessment and plan:   Michelle Duran is an 60 y.o. female patient with right frontal GBM, status post resection and chemotherapy, presented with what appears to be a simple partial seizure lasting 10 minutes. Nonfocal neurological examination. Recommend a Keppra IV loading dose of 1500 mg now. Tomorrow  morning, increase her regular Keppra dose to 1000 mg twice a day from current 500 mg twice a day. Recommend MRI of the brain with and without contrast.  Patient is at her baseline now. Can be discharged home from ER if remains medically stable after completing a brain MRI, follow-up with her regular outpatient physicians.  Discussed with ER physician.

## 2015-11-01 ENCOUNTER — Telehealth: Payer: Self-pay | Admitting: Radiation Oncology

## 2015-11-01 ENCOUNTER — Emergency Department (HOSPITAL_COMMUNITY)
Admission: EM | Admit: 2015-11-01 | Discharge: 2015-11-01 | Disposition: A | Discharge status: Home / Self Care | Payer: BC Managed Care – PPO | Attending: Emergency Medicine | Admitting: Emergency Medicine

## 2015-11-01 ENCOUNTER — Encounter (HOSPITAL_COMMUNITY): Payer: Self-pay

## 2015-11-01 ENCOUNTER — Telehealth: Payer: Self-pay | Admitting: *Deleted

## 2015-11-01 DIAGNOSIS — Z79899 Other long term (current) drug therapy: Secondary | ICD-10-CM | POA: Diagnosis not present

## 2015-11-01 DIAGNOSIS — I10 Essential (primary) hypertension: Secondary | ICD-10-CM | POA: Insufficient documentation

## 2015-11-01 DIAGNOSIS — K219 Gastro-esophageal reflux disease without esophagitis: Secondary | ICD-10-CM | POA: Insufficient documentation

## 2015-11-01 DIAGNOSIS — R569 Unspecified convulsions: Secondary | ICD-10-CM | POA: Insufficient documentation

## 2015-11-01 DIAGNOSIS — F410 Panic disorder [episodic paroxysmal anxiety] without agoraphobia: Secondary | ICD-10-CM | POA: Insufficient documentation

## 2015-11-01 DIAGNOSIS — E039 Hypothyroidism, unspecified: Secondary | ICD-10-CM | POA: Diagnosis not present

## 2015-11-01 DIAGNOSIS — Z794 Long term (current) use of insulin: Secondary | ICD-10-CM | POA: Diagnosis not present

## 2015-11-01 DIAGNOSIS — E119 Type 2 diabetes mellitus without complications: Secondary | ICD-10-CM | POA: Diagnosis not present

## 2015-11-01 DIAGNOSIS — Z7952 Long term (current) use of systemic steroids: Secondary | ICD-10-CM | POA: Diagnosis not present

## 2015-11-01 DIAGNOSIS — Z87891 Personal history of nicotine dependence: Secondary | ICD-10-CM | POA: Insufficient documentation

## 2015-11-01 DIAGNOSIS — Z86011 Personal history of benign neoplasm of the brain: Secondary | ICD-10-CM | POA: Insufficient documentation

## 2015-11-01 DIAGNOSIS — M1990 Unspecified osteoarthritis, unspecified site: Secondary | ICD-10-CM | POA: Diagnosis not present

## 2015-11-01 HISTORY — DX: Unspecified convulsions: R56.9

## 2015-11-01 LAB — CBG MONITORING, ED
GLUCOSE-CAPILLARY: 127 mg/dL — AB (ref 65–99)
Glucose-Capillary: 96 mg/dL (ref 65–99)

## 2015-11-01 MED ORDER — SODIUM CHLORIDE 0.9 % IV SOLN
2000.0000 mg | Freq: Once | INTRAVENOUS | Status: AC
  Administered 2015-11-01: 2000 mg via INTRAVENOUS
  Filled 2015-11-01: qty 20

## 2015-11-01 MED ORDER — CLONAZEPAM 0.5 MG PO TABS: ORAL_TABLET | ORAL | Status: DC

## 2015-11-01 MED ORDER — LEVETIRACETAM 500 MG PO TABS: 500.0000 mg | ORAL_TABLET | Freq: Two times a day (BID) | ORAL | Status: DC

## 2015-11-01 NOTE — Telephone Encounter (Signed)
Phoned patient. Explained her case will be presented in brain conference on Monday morning then, one of our staff will phone her back with further direction.

## 2015-11-01 NOTE — Telephone Encounter (Signed)
Patient phoned to inform this RN she had a seizure last night and presented to [facility] ED. She understands "the MRI isn't good". Also, she reports she was instructed to increase her Keppra. Thanked patient for the call. Explained this RN will inform Dr. Tammi Klippel, Dr. Lindi Adie, Shona Simpson, and Mont Dutton of these findings then, call her back with further directed. Patient verbalized understanding.

## 2015-11-01 NOTE — ED Provider Notes (Signed)
CSN: [id number]     Arrival date & time 11/01/15  1627 History   First MD Initiated Contact with Patient 11/01/15 1720     Chief Complaint  Patient presents with  . Seizures      HPI  Patient presents for evaluation of seizure. 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 Had had focal left face, left upper extremity seizure. Did not generalize. Had recollection of the symptoms. MRI and forcefully showed local recurrence, as well as new small area of tumor in the left frontal lobe. Neurologically intact in the emergency room after symptoms resolved. Seen in consult by neurology. Had been started prophylactically on Keppra 250 mg twice a day. He was increased yesterday to 1 g twice a day. Higher dose last night, and this morning. Had another recurrence today.  Describes her left side of her face feeling like it is drawn back. Feels a tightness in her left neck and somewhat into her left arm and shoulder. Is aware of this. Is able to take her right arm and support her left arm with it. Clearly not generalizing.  Past Medical History  Diagnosis Date  . Gallstone   . Hypertension   . Hypothyroidism   . Mitral valve prolapse   . Fibromyalgia   . Anxiety   . Vitamin D deficiency   . Diabetes mellitus without complication ([place])   . Tobacco abuse   . Mass of adrenal gland ([place])   . Wears glasses   . Complication of anesthesia     Pt was shaking after having epidural  during c-section  . PVC's (premature ventricular contractions)     saw cardiologist Dr. Claiborne Billings > 3 years ago with echo and stress (entered 08/08/2015)  . Vertigo   . Numbness and tingling     fingers and toes  . Arthritis   . GERD (gastroesophageal reflux disease)   . History of hiatal hernia   . Nocturia   . Family history of adverse reaction to anesthesia     mother had PONV  . Panic attacks   .  Neoplasm of brain ([place])   . Seizures [facility])    Past Surgical History  Procedure Laterality Date  . Cesarean section    . Multiple tooth extractions    . Esophagus stretched    . Craniotomy Right 08/09/2015    Procedure: Right Frontotemporal craniotomy for tumor resection with stereotactic navigation;  Surgeon: Kevan Ny Ditty, MD;  Location: [facility];  Service: Neurosurgery;  Laterality: Right;  Right Frontotemporal craniotomy for tumor resection with stereotactic navigation   Family History  Problem Relation Age of Onset  . Hypertension Father   . Glaucoma    . Ovarian cancer Cousin   . Breast cancer Paternal Grandmother    Social History  Substance Use Topics  . Smoking status: Former Smoker -- 1.00 packs/day for 41 years    Types: Cigarettes    Quit date: 07/24/2015  . Smokeless tobacco: Never Used  . Alcohol Use: No   OB History    No data available     Review of Systems  Constitutional: Negative for fever, chills, diaphoresis, appetite change and fatigue.  HENT: Negative for mouth sores, sore throat and trouble swallowing.   Eyes: Negative for visual disturbance.  Respiratory: Negative for cough, chest tightness, shortness of breath and wheezing.   Cardiovascular: Negative for chest pain.  Gastrointestinal:  Negative for nausea, vomiting, abdominal pain, diarrhea and abdominal distention.  Endocrine: Negative for polydipsia, polyphagia and polyuria.  Genitourinary: Negative for dysuria, frequency and hematuria.  Musculoskeletal: Negative for gait problem.  Skin: Negative for color change, pallor and rash.  Neurological: Positive for seizures. Negative for dizziness, syncope, light-headedness and headaches.  Hematological: Does not bruise/bleed easily.  Psychiatric/Behavioral: Negative for behavioral problems and confusion.      Allergies  Codeine; Prednisone; and Sulfonamide derivatives  Home Medications   Prior to Admission medications   Medication  Sig Start Date End Date Taking? Authorizing Provider  acetaminophen (TYLENOL) 650 MG CR tablet Take 1,300 mg by mouth every 8 (eight) hours as needed for pain.   Yes Historical Provider, MD  amoxicillin (AMOXIL) 500 MG capsule Take one capsule by mouth every 8 hours until all gone. Patient taking differently: Take 500 mg by mouth 2 (two) times daily. Take one capsule by mouth every 8 hours until all gone. 10/16/15  Yes Lenn Cal, DDS  dexamethasone (DECADRON) 4 MG tablet Take 2 mg by mouth 2 (two) times daily. 08/01/15  Yes Historical Provider, MD  Insulin Glargine (LANTUS) 100 UNIT/ML Solostar Pen Inject 20 Units into the skin daily at 10 pm. Patient taking differently: Inject 8 Units into the skin daily at 10 pm.  07/27/15  Yes Allie Bossier, MD  LORazepam (ATIVAN) 0.5 MG tablet TAKE 1 TABLET BY MOUTH EVERY 4 TO 6 HOURS AS NEEDED FOR NAUSEA 09/27/15  Yes Historical Provider, MD  pantoprazole (PROTONIX) 40 MG tablet Take 1 tablet (40 mg total) by mouth daily. Switch for any other PPI at similar dose and frequency 09/20/15  Yes Hayden Pedro, PA-C  potassium chloride (MICRO-K) 10 MEQ CR capsule Take 2 capsules (20 mEq total) by mouth 2 (two) times daily. 10/30/15  Yes Hayden Pedro, PA-C  sertraline (ZOLOFT) 100 MG tablet Take 150 mg by mouth at bedtime. Reported on 11/01/2015   Yes Historical Provider, MD  SYNTHROID 25 MCG tablet Take 12.5 mcg by mouth daily before breakfast.  10/13/15  Yes Historical Provider, MD  clonazePAM (KLONOPIN) 0.5 MG tablet 0.5mg  ODT.  1 q12 hrs prn seizure 11/01/15   Tanna Furry, MD  dapsone 100 MG tablet Take 1 tablet (100 mg total) by mouth daily. Take daily while receiving radiation Patient not taking: Reported on 10/31/2015 09/08/15   Nicholas Lose, MD  lactose free nutrition (BOOST PLUS) LIQD Take 237 mLs by mouth daily. Patient not taking: Reported on 10/31/2015 07/27/15   Allie Bossier, MD  levETIRAcetam (KEPPRA) 500 MG tablet Take 1 tablet (500 mg  total) by mouth 2 (two) times daily. 11/01/15   Tanna Furry, MD  nystatin (MYCOSTATIN) 100000 UNIT/ML suspension Take 5 mLs (500,000 Units total) by mouth 4 (four) times daily. Patient not taking: Reported on 10/31/2015 10/11/15   Nicholas Lose, MD  oxyCODONE-acetaminophen (PERCOCET/ROXICET) 5-325 MG tablet Take 1-2 tablets by mouth every 4 (four) hours as needed for moderate pain or severe pain. Reported on 09/29/2015 08/11/15   Historical Provider, MD  sertraline (ZOLOFT) 50 MG tablet Take 3 tablets (150 mg total) by mouth every evening. Patient not taking: Reported on 10/31/2015 07/27/15   Allie Bossier, MD  temozolomide [facility]) 180 MG capsule Take 1 capsule (180 mg total) by mouth daily. May take on an empty stomach or at bedtime to decrease nausea & vomiting. Patient not taking: Reported on 10/31/2015 10/02/15   Nicholas Lose, MD   BP 128/71 mmHg  Pulse 43  Temp(Src) 97.9 F (36.6 C) (Oral)  Resp 16  SpO2 98% Physical Exam  Constitutional: She is oriented to person, place, and time. She appears well-developed and well-nourished. No distress.  HENT:  Head: Normocephalic.    Eyes: Conjunctivae are normal. Pupils are equal, round, and reactive to light. No scleral icterus.  Neck: Normal range of motion. Neck supple. No thyromegaly present.  Cardiovascular: Normal rate and regular rhythm.  Exam reveals no gallop and no friction rub.   No murmur heard. Pulmonary/Chest: Effort normal and breath sounds normal. No respiratory distress. She has no wheezes. She has no rales.  Abdominal: Soft. Bowel sounds are normal. She exhibits no distension. There is no tenderness. There is no rebound.  Musculoskeletal: Normal range of motion.  Neurological: She is alert and oriented to person, place, and time.  Symmetric cranial nerves. No focal neurological deficits fortunately's. Normal sensation and strength. Normal DTRs. Does not have any evident Todd's paresis after her seizure.  Skin: Skin is warm and dry.  No rash noted.  Psychiatric: She has a normal mood and affect. Her behavior is normal.    ED Course  Procedures (including critical care time) Labs Review Labs Reviewed  CBG MONITORING, ED - Abnormal; Notable for the following:    Glucose-Capillary 127 (*)    All other components within normal limits  CBG MONITORING, ED    Imaging Review Mr Kizzie Fantasia Contrast  10/31/2015  CLINICAL DATA:  Status post 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 Recent seizure. History of hypertension, diabetes. EXAM: MRI HEAD WITHOUT AND WITH CONTRAST TECHNIQUE: Multiplanar, multiecho pulse sequences of the brain and surrounding structures were obtained without and with intravenous contrast. CONTRAST:  74mL MULTIHANCE GADOBENATE DIMEGLUMINE 529 MG/ML IV SOLN COMPARISON:  MRI of the brain August 30, 2015 FINDINGS: Thick irregular enhancement of RIGHT temporal lobe resection cavity, dominant component measuring 4.3 x 3.7 cm, previously 3.3 x 1.9 cm. Second RIGHT frontal lobe resection cavity shows markedly thickened nodular enhancement, progressed from prior examination, in total measuring 3 x 3.3 cm, previously 2.5 x 2.9 cm. RIGHT mesial frontal lobe nodule was sub cm, now 11 mm. In addition, new 9 mm LEFT mesial frontal lobe nodule, and multiple new subcentimeter enhancing nodules in the bifrontal lobes. Extensive FLAIR T2 hyperintense signal surrounding the resection cavities and the new masses, with additional slightly more conspicuous subcentimeter foci of FLAIR T2 hyperintense signal LEFT frontoparietal deep white matter. Reduced diffusion associated with the nodular enhancement without reduced diffusion to suggest acute ischemia. No midline shift. No hydrocephalus. No abnormal extra-axial fluid collections. Thickened RIGHT cerebrum dural enhancement consistent with recent RIGHT craniotomy. Normal major intracranial vascular flow voids present skull base. Ocular  globes and orbital contents are unremarkable. Increasing RIGHT mastoid effusion. Paranasal sinus are well aerated. Ocular globes and orbital contents are normal. IMPRESSION: Marked interval progression of multifocal GBM, with recurrent disease along the margin of the RIGHT frontal and RIGHT temporal lobe resection cavities. New bifrontal lobe small masses. Extensive abnormal FLAIR signal surrounding the enhancing masses suggesting a component of nonenhancing tumor. In addition, more conspicuous non nonenhancing abnormal signal in the LEFT frontoparietal white matter concerning for early tumor. Acute findings discussed with and reconfirmed by Dr.FLOYD on 10/31/2015 at 9:35 pm. Electronically Signed   By: Elon Alas M.D.   On: 10/31/2015 21:38   I have personally reviewed and evaluated these images and lab results as part of my medical decision-making.  EKG Interpretation None      MDM   Final diagnoses:  Seizure [facility])    Discussed with Dr. Silverio Decamp, who had seen the patient consultation in the emergency room yesterday. After our discussion was determined that the patient had been tolerating her higher dose of Keppra since yesterday. He recommended IV bolus of Keppra and increasing her dosage to 1500 twice a day. He also recommended when necessary 0.5 mg ODT clonazepam.  This was discussed at length with the patient and her mother. They felt comfortable with this plan. I have no reservations regarding her being treated again as an outpatient.    Tanna Furry, MD 11/01/15 [phone]

## 2015-11-01 NOTE — Telephone Encounter (Signed)
Patient called to report that she had a seizure yesterday and was seen in [facility] ED, MRI done which showed progression. Was told by ED physician to notify us of this. Informed Dr. Lindi Adie. Call from Sam with Dr. Tammi Klippel, patient placed on schedule for Brain conference on Monday. Sam called patient to let her know.

## 2015-11-01 NOTE — Discharge Instructions (Signed)
Increase your Keppra to 1500 mg morning, and evening. Clonazepam 0.5 mg oral dissolving tablet every 12 hours as needed for breakthrough seizure.   Seizure, Adult A seizure is abnormal electrical activity in the brain. Seizures usually last from 30 seconds to 2 minutes. There are various types of seizures. Before a seizure, you may have a warning sensation (aura) that a seizure is about to occur. An aura may include the following symptoms:   Fear or anxiety.  Nausea.  Feeling like the room is spinning (vertigo).  Vision changes, such as seeing flashing lights or spots. Common symptoms during a seizure include:  A change in attention or behavior (altered mental status).  Convulsions with rhythmic jerking movements.  Drooling.  Rapid eye movements.  Grunting.  Loss of bladder and bowel control.  Bitter taste in the mouth.  Tongue biting. After a seizure, you may feel confused and sleepy. You may also have an injury resulting from convulsions during the seizure. HOME CARE INSTRUCTIONS   If you are given medicines, take them exactly as prescribed by your health care provider.  Keep all follow-up appointments as directed by your health care provider.  Do not swim or drive or engage in risky activity during which a seizure could cause further injury to you or others until your health care provider says it is OK.  Get adequate rest.  Teach friends and family what to do if you have a seizure. They should:  Lay you on the ground to prevent a fall.  Put a cushion under your head.  Loosen any tight clothing around your neck.  Turn you on your side. If vomiting occurs, this helps keep your airway clear.  Stay with you until you recover.  Know whether or not you need emergency care. SEEK IMMEDIATE MEDICAL CARE IF:  The seizure lasts longer than 5 minutes.  The seizure is severe or you do not wake up immediately after the seizure.  You have an altered mental status  after the seizure.  You are having more frequent or worsening seizures. Someone should drive you to the emergency department or call local emergency services (911 in [place] MAKE SURE YOU:  Understand these instructions.  Will watch your condition.  Will get help right away if you are not doing well or get worse.   This information is not intended to replace advice given to you by your health care provider. Make sure you discuss any questions you have with your health care provider.   Document Released: 08/16/2000 Document Revised: 09/09/2014 Document Reviewed: 03/31/2013 Elsevier Interactive Patient Education [employer].

## 2015-11-01 NOTE — Telephone Encounter (Signed)
Received call from patient that she is "having another seizure and her mother is driving her to the emergency room." Will continue to monitor patient's status. Informed Mont Dutton, RT of this finding. Informed Shona Simpson, PA of this finding.Michelle Duran confirms she will inform Dr. Cyndy Freeze.

## 2015-11-01 NOTE — ED Notes (Signed)
Pt presents after seizure this afternoon, intermittent tingling in fingers and toes x "1-2 days," and intermittent facial twitching starting yesterday.  Denies pain.  Pt was seen at [facility] yesterday for new onset seizures.  Pt had 2 brain tumors removed in December.  Sts she is on seizure medication and had it increased yesterday.  Pt is followed by Ditty MD.

## 2015-11-02 ENCOUNTER — Other Ambulatory Visit: Payer: Self-pay

## 2015-11-02 DIAGNOSIS — C719 Malignant neoplasm of brain, unspecified: Secondary | ICD-10-CM

## 2015-11-02 MED ORDER — TEMOZOLOMIDE 250 MG PO CAPS: 250.0000 mg | ORAL_CAPSULE | Freq: Every day | ORAL | Status: DC

## 2015-11-03 ENCOUNTER — Telehealth: Payer: Self-pay | Admitting: *Deleted

## 2015-11-03 NOTE — Telephone Encounter (Signed)
Pt called stating she misplaced Rx for Keppra and asked ERCM to call in to [facility].  ERCM called pharmacy to find that pt has refill on file and is able to pick up tomorrow.  ERCM returned call to pt to update her on status.

## 2015-11-06 ENCOUNTER — Encounter: Payer: Self-pay | Admitting: Genetic Counselor

## 2015-11-07 ENCOUNTER — Telehealth: Payer: Self-pay | Admitting: *Deleted

## 2015-11-07 NOTE — Telephone Encounter (Signed)
Called patient to see if she can come in to be seen this afternoon or tomorrow morning. Patient at store and will call us back.

## 2015-11-09 ENCOUNTER — Telehealth: Payer: Self-pay | Admitting: Radiation Oncology

## 2015-11-09 ENCOUNTER — Ambulatory Visit
Admission: RE | Admit: 2015-11-09 | Discharge: 2015-11-09 | Disposition: A | Discharge status: Home / Self Care | Payer: BC Managed Care – PPO | Source: Ambulatory Visit | Attending: Radiation Oncology | Admitting: Radiation Oncology

## 2015-11-09 ENCOUNTER — Ambulatory Visit (HOSPITAL_BASED_OUTPATIENT_CLINIC_OR_DEPARTMENT_OTHER)
Admission: RE | Admit: 2015-11-09 | Discharge: 2015-11-09 | Disposition: A | Discharge status: Home / Self Care | Payer: BC Managed Care – PPO | Source: Ambulatory Visit | Attending: Radiation Oncology | Admitting: Radiation Oncology

## 2015-11-09 ENCOUNTER — Encounter: Payer: Self-pay | Admitting: Radiation Oncology

## 2015-11-09 VITALS — BP 111/70 | HR 52 | Resp 16 | Wt 166.0 lb

## 2015-11-09 DIAGNOSIS — Z66 Do not resuscitate: Secondary | ICD-10-CM | POA: Diagnosis not present

## 2015-11-09 DIAGNOSIS — Z515 Encounter for palliative care: Secondary | ICD-10-CM

## 2015-11-09 DIAGNOSIS — C712 Malignant neoplasm of temporal lobe: Secondary | ICD-10-CM | POA: Diagnosis not present

## 2015-11-09 DIAGNOSIS — E876 Hypokalemia: Secondary | ICD-10-CM

## 2015-11-09 DIAGNOSIS — R569 Unspecified convulsions: Secondary | ICD-10-CM

## 2015-11-09 DIAGNOSIS — C719 Malignant neoplasm of brain, unspecified: Secondary | ICD-10-CM

## 2015-11-09 LAB — BASIC METABOLIC PANEL
Anion Gap: 10 mEq/L (ref 3–11)
BUN: 14.8 mg/dL (ref 7.0–26.0)
CHLORIDE: 110 meq/L — AB (ref 98–109)
CO2: 22 meq/L (ref 22–29)
Calcium: 7.4 mg/dL — ABNORMAL LOW (ref 8.4–10.4)
Creatinine: 0.8 mg/dL (ref 0.6–1.1)
EGFR: 78 mL/min/{1.73_m2} — AB (ref >90)
GLUCOSE: 168 mg/dL — AB (ref 70–140)
POTASSIUM: 4 meq/L (ref 3.5–5.1)
Sodium: 142 mEq/L (ref 136–145)

## 2015-11-09 MED ORDER — LEVETIRACETAM 500 MG PO TABS: 1500.0000 mg | ORAL_TABLET | Freq: Two times a day (BID) | ORAL | Status: DC

## 2015-11-09 NOTE — Progress Notes (Signed)
Radiation Oncology         [phone]) [phone] ________________________________  Name: Michelle Duran MRN: [id number]  Date: 11/09/2015  DOB: [birth year]    Follow-Up Visit Note  CC: Reginia Naas, MD  Ditty, Kevan Ny, *  Diagnosis:   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 Seizures ([place]) 780.39 R56.9 levETIRAcetam (KEPPRA) 500 MG tablet  2. Glioblastoma multiforme of brain ([facility]) 191.9 C71.9     Interval Since Last Radiation:  1  months ; 10/11/2015  Narrative:  The patient returns today for routine follow-up.  Weight and vitals stable. Denies pain. Patient easily confused. Reports on rare occasion she has a whistle in her right ear. Denies headache, dizziness, nausea, vomiting, or diplopia. Scalp incision well approximated without redness, drainage or edema. Alopecia noted. Worked with patient to clarify appropriate medication regimen. Patient denies taking Temodar at this time. Left message with Gudena's nurse to determine if patient should be taking Temodar presently. Scheduled to follow up with Gudena 3/13 at 2:15pm and Ditty 3/17 at 3:15pm. Patient taking decadron 2 mg bid. No thrush noted.                                ALLERGIES:  is allergic to codeine; prednisone; and sulfonamide derivatives.  Meds: Current Outpatient Prescriptions  Medication Sig Dispense Refill  . amoxicillin (AMOXIL) 500 MG capsule Take one capsule by mouth every 8 hours until all gone. (Patient taking differently: Take 500 mg by mouth 2 (two) times daily. Take one capsule by mouth every 8 hours until all gone.) 22 capsule 0  . B-D ULTRAFINE III SHORT PEN 31G X 8 MM MISC     . clonazePAM (KLONOPIN) 0.5 MG disintegrating tablet DISSOLVE 1 TABLET IN MOUTH EVERY 12 HOURS AS NEEDED SEIZURE  0  . dexamethasone (DECADRON) 4 MG tablet Take 2 mg by mouth 2 (two) times daily.    . Insulin Glargine (LANTUS) 100  UNIT/ML Solostar Pen Inject 20 Units into the skin daily at 10 pm. (Patient taking differently: Inject 8 Units into the skin daily at 10 pm. ) 15 mL 11  . levETIRAcetam (KEPPRA) 500 MG tablet Take 1,500 mg by mouth 2 (two) times daily.    Marland Kitchen LORazepam (ATIVAN) 0.5 MG tablet Reported on 11/09/2015  3  . pantoprazole (PROTONIX) 40 MG tablet Take 1 tablet (40 mg total) by mouth daily. Switch for any other PPI at similar dose and frequency 30 tablet 4  . potassium chloride (MICRO-K) 10 MEQ CR capsule Take 2 capsules (20 mEq total) by mouth 2 (two) times daily. 60 capsule 0  . SYNTHROID 25 MCG tablet Take 12.5 mcg by mouth daily before breakfast.   0  . acetaminophen (TYLENOL) 650 MG CR tablet Take 1,300 mg by mouth every 8 (eight) hours as needed for pain. Reported on 11/09/2015    . dapsone 100 MG tablet Take 1 tablet (100 mg total) by mouth daily. Take daily while receiving radiation (Patient not taking: Reported on 10/31/2015) 30 tablet 1  . lactose free nutrition (BOOST PLUS) LIQD Take 237 mLs by mouth daily. (Patient not taking: Reported on 10/31/2015) 30 Can 0  . levETIRAcetam (KEPPRA) 500 MG tablet Take 3 tablets (1,500 mg total) by mouth 2 (two) times daily. 180 tablet 0  . nystatin (MYCOSTATIN) 100000 UNIT/ML suspension Take  5 mLs (500,000 Units total) by mouth 4 (four) times daily. (Patient not taking: Reported on 10/31/2015) 60 mL 0  . oxyCODONE-acetaminophen (PERCOCET/ROXICET) 5-325 MG tablet Take 1-2 tablets by mouth every 4 (four) hours as needed for moderate pain or severe pain. Reported on 11/09/2015    . sertraline (ZOLOFT) 100 MG tablet Take 150 mg by mouth at bedtime. Reported on 11/09/2015    . sertraline (ZOLOFT) 50 MG tablet Take 3 tablets (150 mg total) by mouth every evening. (Patient not taking: Reported on 10/31/2015) 90 tablet 0  . temozolomide (TEMODAR) 180 MG capsule Reported on 11/09/2015    . temozolomide (TEMODAR) 250 MG capsule Take 1 capsule (250 mg total) by mouth daily. May take on an  empty stomach or at bedtime to decrease nausea & vomiting. Take for 5 days. (Patient not taking: Reported on 11/09/2015) 5 capsule 0   No current facility-administered medications for this encounter.   Facility-Administered Medications Ordered in Other Encounters  Medication Dose Route Frequency Provider Last Rate Last Dose  . SONAFINE emulsion 1 application  1 application Topical BID Tyler Pita, MD   1 application at [id number]    Physical Findings: The patient is in no acute distress. Patient is alert and oriented.  weight is 166 lb (75.297 kg). Her blood pressure is 111/70 and her pulse is 52. Her respiration is 16 and oxygen saturation is 100%. .  No significant changes.  Lab Findings: Lab Results  Component Value Date   WBC 4.3 10/31/2015   WBC 6.0 10/11/2015   HGB 11.0* 10/31/2015   HGB 12.3 10/11/2015   HCT 33.3* 10/31/2015   HCT 36.3 10/11/2015   PLT 214 10/31/2015   PLT 215 10/11/2015    Lab Results  Component Value Date   NA 142 11/09/2015   NA 147* 10/31/2015   K 4.0 11/09/2015   K 3.3* 10/31/2015   CHLORIDE 110* 11/09/2015   CO2 22 11/09/2015   CO2 23 10/31/2015   GLUCOSE 168* 11/09/2015   GLUCOSE 120* 10/31/2015   BUN 14.8 11/09/2015   BUN 14 10/31/2015   CREATININE 0.8 11/09/2015   CREATININE 1.06* 10/31/2015   BILITOT 0.5 10/31/2015   BILITOT 1.31* 10/11/2015   ALKPHOS 36* 10/31/2015   ALKPHOS 42 10/11/2015   AST 17 10/31/2015   AST 13 10/11/2015   ALT 23 10/31/2015   ALT 17 10/11/2015   PROT 5.1* 10/31/2015   PROT 5.5* 10/11/2015   ALBUMIN 3.2* 10/31/2015   ALBUMIN 3.4* 10/11/2015   CALCIUM 7.4* 11/09/2015   CALCIUM 7.7* 10/31/2015   ANIONGAP 10 11/09/2015   ANIONGAP 14 10/31/2015    Radiographic Findings: Mr Jeri Cos Wo Contrast  10/31/2015  CLINICAL DATA:  Status post 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 Recent seizure. History of hypertension, diabetes. EXAM: MRI HEAD WITHOUT AND  WITH CONTRAST TECHNIQUE: Multiplanar, multiecho pulse sequences of the brain and surrounding structures were obtained without and with intravenous contrast. CONTRAST:  7mL MULTIHANCE GADOBENATE DIMEGLUMINE 529 MG/ML IV SOLN COMPARISON:  MRI of the brain August 30, 2015 FINDINGS: Thick irregular enhancement of RIGHT temporal lobe resection cavity, dominant component measuring 4.3 x 3.7 cm, previously 3.3 x 1.9 cm. Second RIGHT frontal lobe resection cavity shows markedly thickened nodular enhancement, progressed from prior examination, in total measuring 3 x 3.3 cm, previously 2.5 x 2.9 cm. RIGHT mesial frontal lobe nodule was sub cm, now 11 mm. In addition, new 9 mm LEFT mesial frontal lobe nodule,  and multiple new subcentimeter enhancing nodules in the bifrontal lobes. Extensive FLAIR T2 hyperintense signal surrounding the resection cavities and the new masses, with additional slightly more conspicuous subcentimeter foci of FLAIR T2 hyperintense signal LEFT frontoparietal deep white matter. Reduced diffusion associated with the nodular enhancement without reduced diffusion to suggest acute ischemia. No midline shift. No hydrocephalus. No abnormal extra-axial fluid collections. Thickened RIGHT cerebrum dural enhancement consistent with recent RIGHT craniotomy. Normal major intracranial vascular flow voids present skull base. Ocular globes and orbital contents are unremarkable. Increasing RIGHT mastoid effusion. Paranasal sinus are well aerated. Ocular globes and orbital contents are normal. IMPRESSION: Marked interval progression of multifocal GBM, with recurrent disease along the margin of the RIGHT frontal and RIGHT temporal lobe resection cavities. New bifrontal lobe small masses. Extensive abnormal FLAIR signal surrounding the enhancing masses suggesting a component of nonenhancing tumor. In addition, more conspicuous non nonenhancing abnormal signal in the LEFT frontoparietal white matter concerning for  early tumor. Acute findings discussed with and reconfirmed by Dr.FLOYD on 10/31/2015 at 9:35 pm. Electronically Signed   By: Elon Alas M.D.   On: 10/31/2015 21:38    Impression:  The patient is recovering from the effects of radiation.  Her MRI shows progression.  Plan:  She sees Gudena to discuss possible Avastin.  _____________________________________  Sheral Apley. Tammi Klippel, M.D.   This document serves as a record of services personally performed by Tyler Pita, MD. It was created on his behalf by Arlyce Harman, a trained medical scribe. The creation of this record is based on the scribe's personal observations and the provider's statements to them. This document has been checked and approved by the attending provider.

## 2015-11-09 NOTE — Telephone Encounter (Signed)
Called in [place] as directed by Wadie Lessen, NP to [facility] on [address].

## 2015-11-09 NOTE — Progress Notes (Signed)
Weight and vitals stable. Denies pain. Patient easily confused. Reports on rare occasion she has a whistle in her right ear. Denies headache, dizziness, nausea, vomiting, or diplopia. Scalp incision well approximated without redness, drainage or edema. Alopecia noted. Worked with patient to clarify appropriate medication regimen. Patient denies taking Temodar at this time. Left message with Gudena's nurse to determine if patient should be taking Temodar presently. Scheduled to follow up with Gudena 3/13 at 2:15pm and Ditty 3/17 at 3:15pm. Patient taking decadron 2 mg bid. No thrush noted.   BP 111/70 mmHg  Pulse 52  Resp 16  Wt 166 lb (75.297 kg)  SpO2 100% Wt Readings from Last 3 Encounters:  11/09/15 166 lb (75.297 kg)  10/11/15 164 lb 8 oz (74.617 kg)  10/05/15 166 lb 12.8 oz (75.66 kg)

## 2015-11-10 DIAGNOSIS — Z66 Do not resuscitate: Secondary | ICD-10-CM | POA: Insufficient documentation

## 2015-11-10 DIAGNOSIS — Z515 Encounter for palliative care: Secondary | ICD-10-CM | POA: Insufficient documentation

## 2015-11-10 NOTE — Consult Note (Signed)
Consultation Note Date: 11/10/2015   Patient Name: Michelle Duran  DOB: 10/31/1955  MRN: [id number]  Age / Sex: 60 y.o., female  PCP: Michelle Ada, MD Referring Physician: No att. providers found  Reason for Consultation: Pinch and introduction of Palliative Medicine into her treatment plan  Clinical Assessment/Narrative:   This NP Michelle Duran reviewed medical records, and meet with Michelle Duran in the OP Radiation-oncology clinic with her parents  to discuss diagnosis, prognosis, GOC, symptom management as indicated and emotional support.  A detailed discussion was had today regarding advanced directives, strongly encouraged to secure HPOA.  I gave her numbers to contact SW at the cancer center for assistance.    Concepts specific to code status, artifical feeding was had. (No artificial feeding now or in the future and she desires to be DNR)  Discussed importance of documenting HPOA.  Michelle Duran tells me she wants her 41 yo son  Michelle Duran to be her HPOA.     Most of today's time was spent allowing for space and time for Michelle Duran to share her thoughts, feelings and fears regarding living with a terminal illness.   "Unsure"   What she wants to do regarding further treatments, "what I've done so far hasn't been so good, as she takes off her hat "looks at me, I drool at dinner and embarrass my son", crying she speaks to not wanting to take the Temadar anymore because " I couldn't eat, every thing tasted bad and like metal".    She verbalizes an understanding of her long term prognosis, but remains hopeful for continued quality of life.  She speaks to a sense of " peace and joy" even though she know " there is no cure"   She is tearful throughout today's meeting.    Values and goals of care important to patient and family were attempted to be elicited.   Hospice benefit was discussed  Questions and concerns addressed.   Patient and  her family were encouraged to call with questions or concerns.  PMT will continue to support holistically.  It is clear that there is some confusion keeping track of doctor appoinments and her medications.  She sahres a large bag of disorganized bottles of meds with different dosing.  She currently is not taking Temadar   Michelle Lai  RN reorganized and labled all her medications.   SUMMARY OF RECOMMENDATIONS  - at this time continue to treat the treatable, Michelle Duran is hopeful for " more quality time"  ( she does understand the long term poor prognosis) - appointments next week with both Oncology and Surgery, open to offered treatment "if I can take it" - offered support meetings with Michelle Duran and her son with this NP, she will discuss with [facility]   Code Status/Advance Care Planning:  DNR-docuemnted  Code Status History    Date Active Date Inactive Code Status Order ID Comments User Context   08/09/2015  2:05 PM 08/11/2015 10:30 PM Full Code [id number]  Kevan Ny Ditty, MD Inpatient   07/24/2015  9:36 PM 07/27/2015  4:28 PM Full Code [id number]  Ivor Costa, MD ED      Other Directives:  Strongly encouraged   Symptom Management:  Fatigue:  -Pace yourself -Plan your day -Include naps and breaks -get a little exercise -fuel the body -deep breathing/prayer/medication *  Seizure prevention/control:   Patient had been taking only Keppra 1000 mg bid when last dose prescribed after most recent seizure was Keppra 1500 mg bid, will call  in correct dose to pharmancy  Palliative Prophylaxis:   Discussed safety in her home specific to her recent seizure activity, possiblity of moving her bedroom downstairs   Psycho-social/Spiritual:  Support System: Fair  Additional Recommendations: Education on Hospice, Data processing manager and Referral to [employer] given to parents:  Glioblastoma Multiforme   Glioblastoma multiforme (GBM) (also called glioblastoma) is  a fast-growing glioma that develops from astrocytes - star-shaped glial cells that support nerve cells. GBM is classified as a grade IV astrocytoma. These are the most invasive type of glial tumors, rapidly growing and commonly spreading to nearby brain tissue. They may be composed of several different kinds of cells (i.e. astrocytes, oligodendrocytes). Sometimes, they evolve from a low-grade astrocytoma or an oligodendroglioma. In adults, GBM occurs most often in the cerebral hemispheres, especially in the frontal and temporal lobes of the brain. GBM is a devastating brain cancer that typically results in death in the first 15 months after diagnosis.   GBMs are biologically aggressive tumors that present unique treatment challenges due to the following characteristics:   ?Localization of tumors in the brain  ?Inherent resistance to conventional therapy  ?Limited capacity of the brain to repair itself  ?Migration of malignant cells into adjacent brain tissue  ?The variably disrupted tumor blood supply which inhibits drug delivery  ?Tumor capillary leakage, resulting in an accumulation of fluid around the tumor (peritumoral edema) and intracranial hypertension  ?A limited response to therapy   ?The neurotoxicity of treatments directed at gliomas Prevalence and Incidence    GBM accounts for about 15 percent of all brain tumors and primarily occurs in adults between the ages of 110 and 22. Between [place] [address], 2005 and 12/30/2007, the median age for death from cancer of the brain and other areas of the nervous system was age 21.  Symptoms   Symptoms vary depending on the location of the brain tumor, but may include any of the following:   ?Persistent headaches  ?Double or blurred vision  ?Vomiting  ?Loss of appetite  ?Changes in mood and personality  ?Changes in ability to think and learn  ?New seizures  ?Speech difficulty of gradual onset  Diagnosis   Sophisticated imaging techniques can pinpoint brain  tumors. Diagnostic tools include computed tomography (CT or CAT scan) and magnetic resonance imaging (MRI). Intraoperative MRI also is used during surgery to guide tissue biopsies and tumor removal. Magnetic resonance spectroscopy (MRS) is used to examine the tumor's chemical profile and determine the nature of the lesions seen on the MRI. Positron emission tomography (PET scan) can help detect recurring brain tumors.  Grading   After a brain tumor is detected on a CT or MRI scan, a neurosurgeon obtains tumor tissue for a biopsy and the tissue is examined by a neuropathologist. The analysis of tumor tissue under a microscope is used to assign the tumor name and grade, and provides answers to the following questions: 1) From what type of brain cell did the tumor arise? (The name of the tumor is derived from this; for example, astrocytomas arise from astrocytes.); 2) Are there signs of rapid growth in the tumor cells?  The tumor name and grade help determine treatment options and also provide important information about prognosis. For more information on grading, click here.  Treatment Options   Palliative care, without any active intervention or treatment, is an option for patients who are disabled. Treatment options for others include surgery, radiation, radiosurgery or chemotherapy. The primary objective of surgery  is to remove as much of the tumor as possible without injuring brain tissue needed for neurological function (such as motor skills, the ability to speak and walk, etc.). However, high-grade tumors are surrounded by a zone of migrating, infiltrating tumor cells that invade surrounding tissues, making it more difficult to remove the entire tumor. If the tumor cannot be removed completely, surgery can reduce the amount of solid tumor tissue, remove those cells in the center of the tumor that may be resistant to treatment and reduce intracranial pressure. Debulking surgery can prolong the lives of  some patients or improve the quality of remaining life.   In most cases, surgeons perform a craniotomy, opening the skull to reach the tumor site. This is done frequently with image guidance, and at times using intra-operative mapping techniques to determine the locations of motor, sensory and speech/language cortex. Intraoperative mapping involves operating on a conscious patient and mapping the anatomy of their language function during the operation. The doctor then decides which portions of the tumor are safe to resect.   Sometime after surgery, when the wound is healed, radiation therapy can begin. The goal of radiation therapy is to kill tumor cells selectively while leaving normal brain tissue unharmed. In standard external beam radiation therapy, multiple sessions of standard-dose "fractions" of radiation are delivered to the tumor site as well as a margin in order to treat the zone of infiltrating tumor cells. Each treatment induces damage to both healthy and normal tissue. By the time the next treatment is given, most of the normal cells have repaired the damage, but the tumor tissue has not. This process is repeated for a total of 10 to 30 treatments, depending on the type of tumor. This additional treatment provides most patients with improved outcomes and longer survival rates compared to surgery alone or the best supportive care.   Radiosurgery is a treatment method that uses specialized radiation delivery systems to focus radiation at the site of the tumor while minimizing the radiation dose to the surrounding brain. Radiosurgery may be used in select cases for tumor recurrence, often using additional information derived from MRS (magnetic resonance spectroscopy) or PET scans (positron emission tomography).   Patients undergoing chemotherapy are administered special drugs designed to kill tumor cells. Chemotherapy with the drug temozolomide is the current standard of treatment for GBM. The drug  is administered every day during radiation therapy and then in six to eight cycles for five days at higher doses once radiation is completed. While the aim of chemotherapy is long-term tumor control, it does so in only about 20 percent of patients. The decision to prescribe other forms of chemotherapy for tumor recurrence is based on a patient's overall health, type of tumor and extent of the cancer. Before considering chemotherapy, patients should discuss it with their oncologists and/or neuro-oncologists.  Because traditional methods of treatment are unlikely to result in a prolonged remission of GBM tumors, researchers presently are investigating several innovative treatments in clinical trials. A number of these treatments are available on an investigational basis at centers specializing in brain-tumor therapies. These include gene therapy, highly focused radiation therapy, immunotherapy and chemotherapies utilized in conjunction with vaccines. One promising vaccine and chemotherapy study yielded an average time for recurrence of the tumor after treatment of 16.6 months, as compared to the previous six-month average recurrence. However, it is important to note that while some of these investigational treatments show promise, the most effective therapies introduced over the past three decades have  improved median survival of GBM patients by an average of only three months.   Glioblastoma Multiforme Resources   These websites offer additional helpful information on glioblastoma multiforme, its causes, as well as treatment options, support and more (Note: these sites are not under the auspice of AANS, and their listing here should not be seen as an endorsement of these sites or their content).   American Brain Tumor Association - www.abta.[place].org Brain Tumor Society - www.braintumor.[facility] - https://ingram.com/   Chief  Complaint/ Primary Diagnoses: Present on Admission:  **None**  I have reviewed the medical record, interviewed the patient and family, and examined the patient. The following aspects are pertinent.  Past Medical History  Diagnosis Date  . Gallstone   . Hypertension   . Hypothyroidism   . Mitral valve prolapse   . Fibromyalgia   . Anxiety   . Vitamin D deficiency   . Diabetes mellitus without complication ([place])   . Tobacco abuse   . Mass of adrenal gland ([place])   . Wears glasses   . Complication of anesthesia     Pt was shaking after having epidural  during c-section  . PVC's (premature ventricular contractions)     saw cardiologist Dr. Claiborne Billings > 3 years ago with echo and stress (entered 08/08/2015)  . Vertigo   . Numbness and tingling     fingers and toes  . Arthritis   . GERD (gastroesophageal reflux disease)   . History of hiatal hernia   . Nocturia   . Family history of adverse reaction to anesthesia     mother had PONV  . Panic attacks   . Neoplasm of brain ([place])   . Seizures [facility])    Social History   Social History  . Marital Status: Divorced    Spouse Name: N/A  . Number of Children: 1  . Years of Education: N/A   Occupational History  . cafateria Freight forwarder    Social History Main Topics  . Smoking status: Former Smoker -- 1.00 packs/day for 41 years    Types: Cigarettes    Quit date: 07/24/2015  . Smokeless tobacco: Never Used  . Alcohol Use: No  . Drug Use: No  . Sexual Activity: Not Currently   Other Topics Concern  . Not on file   Social History Narrative   Family History  Problem Relation Age of Onset  . Hypertension Father   . Glaucoma    . Ovarian cancer Cousin   . Breast cancer Paternal Grandmother    Scheduled Meds: Continuous Infusions: PRN Meds:. Medications Prior to Admission:  Prior to Admission medications   Medication Sig Start Date End Date Taking? Authorizing Provider  acetaminophen (TYLENOL) 650 MG CR tablet Take 1,300 mg by  mouth every 8 (eight) hours as needed for pain. Reported on 11/09/2015    Historical Provider, MD  amoxicillin (AMOXIL) 500 MG capsule Take one capsule by mouth every 8 hours until all gone. Patient taking differently: Take 500 mg by mouth 2 (two) times daily. Take one capsule by mouth every 8 hours until all gone. 10/16/15   Lenn Cal, DDS  B-D ULTRAFINE III SHORT PEN 31G X 8 MM MISC  11/03/15   Historical Provider, MD  clonazePAM (KLONOPIN) 0.5 MG disintegrating tablet DISSOLVE 1 TABLET IN MOUTH EVERY 12 HOURS AS NEEDED SEIZURE 11/01/15   Historical Provider, MD  dapsone 100 MG tablet Take 1 tablet (100 mg total) by mouth daily.  Take daily while receiving radiation Patient not taking: Reported on 10/31/2015 09/08/15   Nicholas Lose, MD  dexamethasone (DECADRON) 4 MG tablet Take 2 mg by mouth 2 (two) times daily. 08/01/15   Historical Provider, MD  Insulin Glargine (LANTUS) 100 UNIT/ML Solostar Pen Inject 20 Units into the skin daily at 10 pm. Patient taking differently: Inject 8 Units into the skin daily at 10 pm.  07/27/15   Allie Bossier, MD  lactose free nutrition (BOOST PLUS) LIQD Take 237 mLs by mouth daily. Patient not taking: Reported on 10/31/2015 07/27/15   Allie Bossier, MD  levETIRAcetam (KEPPRA) 500 MG tablet Take 3 tablets (1,500 mg total) by mouth 2 (two) times daily. 11/09/15   Knox Royalty, NP  levETIRAcetam (KEPPRA) 500 MG tablet Take 1,500 mg by mouth 2 (two) times daily.    Historical Provider, MD  LORazepam (ATIVAN) 0.5 MG tablet Reported on 11/09/2015 09/27/15   Historical Provider, MD  nystatin (MYCOSTATIN) 100000 UNIT/ML suspension Take 5 mLs (500,000 Units total) by mouth 4 (four) times daily. Patient not taking: Reported on 10/31/2015 10/11/15   Nicholas Lose, MD  oxyCODONE-acetaminophen (PERCOCET/ROXICET) 5-325 MG tablet Take 1-2 tablets by mouth every 4 (four) hours as needed for moderate pain or severe pain. Reported on 11/09/2015 08/11/15   Historical Provider, MD  pantoprazole  (PROTONIX) 40 MG tablet Take 1 tablet (40 mg total) by mouth daily. Switch for any other PPI at similar dose and frequency 09/20/15   Hayden Pedro, PA-C  potassium chloride (MICRO-K) 10 MEQ CR capsule Take 2 capsules (20 mEq total) by mouth 2 (two) times daily. 10/30/15   Hayden Pedro, PA-C  sertraline (ZOLOFT) 100 MG tablet Take 150 mg by mouth at bedtime. Reported on 11/09/2015    Historical Provider, MD  sertraline (ZOLOFT) 50 MG tablet Take 3 tablets (150 mg total) by mouth every evening. Patient not taking: Reported on 10/31/2015 07/27/15   Allie Bossier, MD  SYNTHROID 25 MCG tablet Take 12.5 mcg by mouth daily before breakfast.  10/13/15   Historical Provider, MD  temozolomide [facility]) 180 MG capsule Reported on 11/09/2015 10/02/15   Historical Provider, MD  temozolomide (TEMODAR) 250 MG capsule Take 1 capsule (250 mg total) by mouth daily. May take on an empty stomach or at bedtime to decrease nausea & vomiting. Take for 5 days. Patient not taking: Reported on 11/09/2015 11/02/15   Nicholas Lose, MD   Allergies  Allergen Reactions  . Codeine     REACTION: vomiting  . Prednisone     Panic attacks  . Sulfonamide Derivatives     REACTION: nausea    Review of Systems  Constitutional: Positive for fatigue.  HENT: Positive for drooling.     Physical Exam  Constitutional: She is oriented to person, place, and time. She appears well-developed.  HENT:  -noted suture line, clean dry and closed, layer of dry scab noted  Neurological: She is alert and oriented to person, place, and time.  Skin: Skin is warm and dry.    Vital Signs: There were no vitals taken for this visit.  SpO2:   O2 Device:  O2 Flow Rate: .   IO: Intake/output summary: No intake or output data in the 24 hours ending 11/10/15 0839  LBM:   Baseline Weight:   Most recent weight:        Palliative Assessment/Data:    Additional Data Reviewed:  CBC:    Component Value Date/Time   WBC 4.3 10/31/2015  1756   WBC 6.0 10/11/2015 1010   HGB 11.0* 10/31/2015 1756   HGB 12.3 10/11/2015 1010   HCT 33.3* 10/31/2015 1756   HCT 36.3 10/11/2015 1010   PLT 214 10/31/2015 1756   PLT 215 10/11/2015 1010   MCV 95.1 10/31/2015 1756   MCV 92.6 10/11/2015 1010   NEUTROABS 3.0 10/31/2015 1756   NEUTROABS 3.8 10/11/2015 1010   LYMPHSABS 1.0 10/31/2015 1756   LYMPHSABS 1.5 10/11/2015 1010   MONOABS 0.3 10/31/2015 1756   MONOABS 0.6 10/11/2015 1010   EOSABS 0.0 10/31/2015 1756   EOSABS 0.0 10/11/2015 1010   BASOSABS 0.0 10/31/2015 1756   BASOSABS 0.0 10/11/2015 1010   Comprehensive Metabolic Panel:    Component Value Date/Time   NA 142 11/09/2015 1507   NA 147* 10/31/2015 1756   K 4.0 11/09/2015 1507   K 3.3* 10/31/2015 1756   CL 110 10/31/2015 1756   CO2 22 11/09/2015 1507   CO2 23 10/31/2015 1756   BUN 14.8 11/09/2015 1507   BUN 14 10/31/2015 1756   CREATININE 0.8 11/09/2015 1507   CREATININE 1.06* 10/31/2015 1756   GLUCOSE 168* 11/09/2015 1507   GLUCOSE 120* 10/31/2015 1756   CALCIUM 7.4* 11/09/2015 1507   CALCIUM 7.7* 10/31/2015 1756   AST 17 10/31/2015 1756   AST 13 10/11/2015 1010   ALT 23 10/31/2015 1756   ALT 17 10/11/2015 1010   ALKPHOS 36* 10/31/2015 1756   ALKPHOS 42 10/11/2015 1010   BILITOT 0.5 10/31/2015 1756   BILITOT 1.31* 10/11/2015 1010   PROT 5.1* 10/31/2015 1756   PROT 5.5* 10/11/2015 1010   ALBUMIN 3.2* 10/31/2015 1756   ALBUMIN 3.4* 10/11/2015 1010   Discussed with Michelle Lai RN with Dr Tammi Klippel  Time In: 1500 Time Out: 1700 Time Total: 120 min Greater than 50%  of this time was spent counseling and coordinating care related to the above assessment and plan.  Signed by: Michelle Lessen, NP  Knox Royalty, NP  11/10/2015, 8:39 AM  Please contact Palliative Medicine Team phone at [phone] for questions and concerns.

## 2015-11-13 ENCOUNTER — Telehealth: Payer: Self-pay | Admitting: Hematology and Oncology

## 2015-11-13 ENCOUNTER — Ambulatory Visit (HOSPITAL_BASED_OUTPATIENT_CLINIC_OR_DEPARTMENT_OTHER): Payer: BC Managed Care – PPO | Admitting: Hematology and Oncology

## 2015-11-13 DIAGNOSIS — C711 Malignant neoplasm of frontal lobe: Secondary | ICD-10-CM | POA: Diagnosis not present

## 2015-11-13 DIAGNOSIS — C712 Malignant neoplasm of temporal lobe: Secondary | ICD-10-CM | POA: Diagnosis not present

## 2015-11-13 DIAGNOSIS — C719 Malignant neoplasm of brain, unspecified: Secondary | ICD-10-CM

## 2015-11-13 NOTE — Assessment & Plan Note (Signed)
Brain MRI 07/24/2015: Heterogeneous enhancing right frontal lobe mass 4.7 x 3.6 x 3.3 cm with vasogenic edema, heterogeneous enhancing mid right temporal lobe mass 4.1 x 3.1 x 2.2 cm with edema and right-to-left shift and mass effect. 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 and MGMT Methylation: Not Detected) Suggests Poorer response to chemo-XRT and Poor prognosis ----------------------------------------------------------------------------------------------------------------------------------------------------------- Treatment summary:Concurrent chemoradiation with daily temozolomide at 75 mg/m (180 mg dose) started 08/29/2015 completed 10/11/2015 Temozolomide toxicities: nausea, decreased taste, decreased appetite, constipation MRI brain 10/23/2015: Enhancement right temporal lobe cavity 4.3 x 3.7 cm previously 3.3 cm, second right frontal lobe nodule 3 x 3.3 cm, previously 2.9 cm, right mesial frontal lobe nodule 11 mm.  Treatment plan: Avastin 10 mg/kg every 2 weeks. Plan to start treatment in 2 weeks after port has been placed. Severe dental discomfort:Patient sees a dentist  Prognosis:her prognosis is guarded. Return to clinic in 2 weeks to start last Avastin 

## 2015-11-13 NOTE — Telephone Encounter (Signed)
appt made and avs printed. Left message for IR to place port. Pt aware that someone will call her with appt date/time

## 2015-11-13 NOTE — Progress Notes (Signed)
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 This was completed on 10/11/2015. She underwent a brain MRI the abdomen showed progression of disease in the cavity of her surgery, there was also evidence of new masses. She started experiencing seizures and she is currently on Keppra and clonazepam as needed. She had profound nausea to temozolomide and is very glad that she does not have to take it anymore.  REVIEW OF SYSTEMS:   Constitutional: Denies fevers, chills or abnormal weight loss Eyes: Denies blurriness of vision Ears, nose, mouth, throat, and face: Denies mucositis or sore throat Respiratory: Denies cough, dyspnea or wheezes Cardiovascular: Denies palpitation, chest discomfort Gastrointestinal:  Denies nausea, heartburn or change in bowel habits Skin: Denies abnormal skin rashes Lymphatics: Denies new lymphadenopathy or easy bruising Neurological:Denies numbness, tingling or new weaknesses Behavioral/Psych: Mood is stable, no new changes  Extremities: No lower extremity edema All other systems were reviewed with the patient and are negative.  I have reviewed the past medical history, past surgical  history, social history and family history with the patient and they are unchanged from previous note.  ALLERGIES:  is allergic to codeine; prednisone; and sulfonamide derivatives.  MEDICATIONS:  Current Outpatient Prescriptions  Medication Sig Dispense Refill  . acetaminophen (TYLENOL) 650 MG CR tablet Take 1,300 mg by mouth every 8 (eight) hours as needed for pain. Reported on 11/09/2015    . B-D ULTRAFINE III SHORT PEN 31G X 8 MM MISC     . clonazePAM (KLONOPIN) 0.5 MG disintegrating tablet DISSOLVE 1 TABLET IN MOUTH EVERY 12 HOURS AS NEEDED SEIZURE  0  . dexamethasone (DECADRON) 4 MG tablet Take 2 mg by mouth 2 (two) times daily.    . Insulin Glargine (LANTUS) 100 UNIT/ML Solostar Pen Inject 20 Units into the skin daily at 10 pm. (Patient taking differently: Inject 8 Units into the skin daily at 10 pm. ) 15 mL 11  . levETIRAcetam (KEPPRA) 500 MG tablet Take 3 tablets (1,500 mg total) by mouth 2 (two) times daily. 180 tablet 0  . LORazepam (ATIVAN) 0.5 MG tablet Reported on 11/09/2015  3  . nystatin (MYCOSTATIN) 100000 UNIT/ML suspension Take 5 mLs (500,000 Units total) by mouth 4 (four) times daily. (Patient not taking: Reported on 10/31/2015) 60 mL 0  . pantoprazole (PROTONIX) 40 MG tablet Take 1 tablet (40 mg total) by mouth daily. Switch for any other PPI at similar dose and frequency 30 tablet 4  . potassium chloride (MICRO-K) 10 MEQ CR capsule Take 2 capsules (20 mEq total) by mouth 2 (two) times daily. 60 capsule 0  . sertraline (ZOLOFT) 100 MG tablet Take 150 mg by mouth at bedtime. Reported on 11/09/2015    . SYNTHROID 25 MCG tablet Take 12.5 mcg by mouth  daily before breakfast.   0  . temozolomide (TEMODAR) 180 MG capsule Reported on 11/09/2015    . temozolomide (TEMODAR) 250 MG capsule Take 1 capsule (250 mg total) by mouth daily. May take on an empty stomach or at bedtime to decrease nausea & vomiting. Take for 5 days. (Patient not taking: Reported on 11/09/2015) 5 capsule 0   No current  facility-administered medications for this visit.   Facility-Administered Medications Ordered in Other Visits  Medication Dose Route Frequency Provider Last Rate Last Dose  . SONAFINE emulsion 1 application  1 application Topical BID Tyler Pita, MD   1 application at 08/67/61 1517    PHYSICAL EXAMINATION: ECOG PERFORMANCE STATUS: 1 - Symptomatic but completely ambulatory  Filed Vitals:   11/13/15 1403  BP: 115/55  Pulse: 53  Temp: 97.5 F (36.4 C)  Resp: 18   Filed Weights   11/13/15 1403  Weight: 165 lb (74.844 kg)    GENERAL:alert, no distress and comfortable SKIN: skin color, texture, turgor are normal, no rashes or significant lesions EYES: normal, Conjunctiva are pink and non-injected, sclera clear OROPHARYNX:no exudate, no erythema and lips, buccal mucosa, and tongue normal  NECK: supple, thyroid normal size, non-tender, without nodularity LYMPH:  no palpable lymphadenopathy in the cervical, axillary or inguinal LUNGS: clear to auscultation and percussion with normal breathing effort HEART: regular rate & rhythm and no murmurs and no lower extremity edema ABDOMEN:abdomen soft, non-tender and normal bowel sounds MUSCULOSKELETAL:no cyanosis of digits and no clubbing  NEURO: alert & oriented x 3 with fluent speech, no focal motor/sensory deficits EXTREMITIES: No lower extremity edema  LABORATORY DATA:  I have reviewed the data as listed   Chemistry      Component Value Date/Time   NA 142 11/09/2015 1507   NA 147* 10/31/2015 1756   K 4.0 11/09/2015 1507   K 3.3* 10/31/2015 1756   CL 110 10/31/2015 1756   CO2 22 11/09/2015 1507   CO2 23 10/31/2015 1756   BUN 14.8 11/09/2015 1507   BUN 14 10/31/2015 1756   CREATININE 0.8 11/09/2015 1507   CREATININE 1.06* 10/31/2015 1756      Component Value Date/Time   CALCIUM 7.4* 11/09/2015 1507   CALCIUM 7.7* 10/31/2015 1756   ALKPHOS 36* 10/31/2015 1756   ALKPHOS 42 10/11/2015 1010   AST 17 10/31/2015 1756   AST 13  10/11/2015 1010   ALT 23 10/31/2015 1756   ALT 17 10/11/2015 1010   BILITOT 0.5 10/31/2015 1756   BILITOT 1.31* 10/11/2015 1010       Lab Results  Component Value Date   WBC 4.3 10/31/2015   HGB 11.0* 10/31/2015   HCT 33.3* 10/31/2015   MCV 95.1 10/31/2015   PLT 214 10/31/2015   NEUTROABS 3.0 10/31/2015     ASSESSMENT & PLAN:  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 and MGMT Methylation: Not Detected) Suggests Poorer response to chemo-XRT and Poor prognosis ----------------------------------------------------------------------------------------------------------------------------------------------------------- Treatment summary:Concurrent chemoradiation with daily temozolomide at 75 mg/m (180 mg dose) started 08/29/2015 completed 10/11/2015 Temozolomide toxicities: nausea, decreased taste, decreased appetite, constipation MRI brain 10/23/2015: Enhancement right temporal lobe cavity 4.3 x 3.7 cm previously  3.3 cm, second right frontal lobe nodule 3 x 3.3 cm, previously 2.9 cm, right mesial frontal lobe nodule 11 mm.  Treatment plan: Avastin 10 mg/kg every 2 weeks. Plan to start treatment in 2 weeks after port has been placed. Severe dental discomfort:Patient sees a dentist  Prognosis:her prognosis is guarded. Return to clinic in 2 weeks to start last Avastin   Orders Placed This Encounter  Procedures  . IR Fluoro Guide CV Line Right    Indicate type of CVC ordering    Standing Status: Future     Number of Occurrences:      Standing Expiration Date: 01/12/2017    Order Specific Question:  Reason  for exam:    Answer:  Need for IV access for chemo    Order Specific Question:  Is the patient pregnant?    Answer:  No    Order Specific Question:  Preferred Imaging Location?    Answer:  [facility] [place]   The patient has a good understanding of the overall plan. she agrees with it. she will call with any problems that may develop before the next visit here.   Rulon Eisenmenger, MD 11/13/2015

## 2015-11-20 DIAGNOSIS — R569 Unspecified convulsions: Secondary | ICD-10-CM | POA: Insufficient documentation

## 2015-11-21 ENCOUNTER — Other Ambulatory Visit: Payer: Self-pay | Admitting: Radiation Oncology

## 2015-11-21 DIAGNOSIS — C719 Malignant neoplasm of brain, unspecified: Secondary | ICD-10-CM

## 2015-11-22 ENCOUNTER — Telehealth: Payer: Self-pay | Admitting: *Deleted

## 2015-11-22 ENCOUNTER — Other Ambulatory Visit: Payer: Self-pay | Admitting: Radiology

## 2015-11-22 NOTE — Telephone Encounter (Signed)
Per desk RN I have moved appt for treatment from 3/27 to 4/7 due to port placement

## 2015-11-23 ENCOUNTER — Other Ambulatory Visit: Payer: Self-pay | Admitting: Physician Assistant

## 2015-11-24 ENCOUNTER — Ambulatory Visit (HOSPITAL_COMMUNITY): Admission: RE | Admit: 2015-11-24 | Payer: BC Managed Care – PPO | Source: Ambulatory Visit

## 2015-11-24 ENCOUNTER — Other Ambulatory Visit (HOSPITAL_COMMUNITY): Payer: Self-pay

## 2015-11-27 ENCOUNTER — Ambulatory Visit: Payer: Self-pay | Admitting: Hematology and Oncology

## 2015-11-27 ENCOUNTER — Ambulatory Visit: Payer: Self-pay

## 2015-11-27 ENCOUNTER — Other Ambulatory Visit: Payer: Self-pay

## 2015-11-27 ENCOUNTER — Other Ambulatory Visit: Payer: Self-pay | Admitting: Radiology

## 2015-11-28 ENCOUNTER — Other Ambulatory Visit: Payer: Self-pay | Admitting: General Surgery

## 2015-11-28 ENCOUNTER — Ambulatory Visit (HOSPITAL_BASED_OUTPATIENT_CLINIC_OR_DEPARTMENT_OTHER): Payer: BC Managed Care – PPO | Admitting: Hematology and Oncology

## 2015-11-28 ENCOUNTER — Encounter: Payer: Self-pay | Admitting: Hematology and Oncology

## 2015-11-28 VITALS — BP 122/53 | HR 59 | Temp 98.3°F | Resp 18 | Ht 62.0 in | Wt 171.8 lb

## 2015-11-28 DIAGNOSIS — C711 Malignant neoplasm of frontal lobe: Secondary | ICD-10-CM

## 2015-11-28 DIAGNOSIS — C712 Malignant neoplasm of temporal lobe: Secondary | ICD-10-CM

## 2015-11-28 DIAGNOSIS — C719 Malignant neoplasm of brain, unspecified: Secondary | ICD-10-CM

## 2015-11-28 MED ORDER — INSULIN GLARGINE 100 UNIT/ML SOLOSTAR PEN: 8.0000 [IU] | PEN_INJECTOR | Freq: Every day | SUBCUTANEOUS | Status: DC

## 2015-11-28 MED ORDER — DEXAMETHASONE 4 MG PO TABS: 4.0000 mg | ORAL_TABLET | Freq: Every day | ORAL | Status: DC

## 2015-11-28 NOTE — Progress Notes (Signed)
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 She was noted to have progression of disease by MRI and tumor board recommended Avastin therapy. We scheduled her for port placement and Avastin treatment. However patient forgot this appointment as she has profound problems with memory. She came in today to ask additional questions about Avastin and to finalize the treatment plan. She now has a home Program[employer], applications. It appears that she is only taking dexamethasone 4 mg daily. She is controlling her blood sugars with Lantus.  REVIEW OF SYSTEMS:   Constitutional: Denies fevers, chills or abnormal weight loss, scar on the scalp Eyes: Denies blurriness of vision Ears, nose, mouth, throat, and face: Denies mucositis or sore throat Respiratory: Denies cough, dyspnea or wheezes Cardiovascular: Denies palpitation, chest discomfort Gastrointestinal:  Denies nausea, heartburn or change in bowel habits Skin: Denies abnormal skin rashes Lymphatics: Denies new lymphadenopathy or easy bruising Neurological:Denies numbness, tingling or new weaknesses, profound memory impairment and prior seizures Behavioral/Psych: Mood is  stable, no new changes  Extremities: No lower extremity edema  All other systems were reviewed with the patient and are negative.  I have reviewed the past medical history, past surgical history, social history and family history with the patient and they are unchanged from previous note.  ALLERGIES:  is allergic to codeine; prednisone; and sulfonamide derivatives.  MEDICATIONS:  Current Outpatient Prescriptions  Medication Sig Dispense Refill  . acetaminophen (TYLENOL) 650 MG CR tablet Take 1,300 mg by mouth every 8 (eight) hours as needed for pain. Reported on 11/09/2015    . B-D ULTRAFINE III SHORT PEN 31G X 8 MM MISC     . clonazePAM (KLONOPIN) 0.5 MG disintegrating tablet DISSOLVE 1 TABLET IN MOUTH EVERY 12 HOURS AS NEEDED SEIZURE  0  . dexamethasone (DECADRON) 4 MG tablet Take 1 tablet (4 mg total) by mouth daily.    . Insulin Glargine (LANTUS) 100 UNIT/ML Solostar Pen Inject 8 Units into the skin daily at 10 pm. 15 mL 11  . levETIRAcetam (KEPPRA) 500 MG tablet Take 3 tablets (1,500 mg total) by mouth 2 (two) times daily. 180 tablet 0  . LORazepam (ATIVAN) 0.5 MG tablet Reported on 11/09/2015  3  . pantoprazole (PROTONIX) 40 MG tablet Take 1 tablet (40 mg total) by mouth daily. Switch for any other PPI at similar dose and frequency 30 tablet 4  . sertraline (ZOLOFT) 100 MG tablet Take 150 mg by mouth at bedtime. Reported on 11/09/2015    . SYNTHROID 25 MCG tablet Take 12.5 mcg by mouth daily before breakfast.   0   No current facility-administered medications for this visit.   Facility-Administered Medications Ordered in Other Visits  Medication Dose Route Frequency Provider Last Rate Last Dose  . SONAFINE emulsion 1 application  1 application Topical BID Tyler Pita, MD   1 application at 53/29/92 1517    PHYSICAL EXAMINATION: ECOG PERFORMANCE STATUS: 1 - Symptomatic but completely ambulatory  Filed Vitals:   11/28/15 1219  BP: 122/53  Pulse: 59  Temp: 98.3 F (36.8 C)    Resp: 18   Filed Weights   11/28/15 1219  Weight: 171 lb 12.8 oz (77.928 kg)    GENERAL:alert, no distress and comfortable SKIN: skin color, texture, turgor are normal, no rashes or significant lesions EYES: normal, Conjunctiva are pink and non-injected, sclera clear OROPHARYNX:no exudate, no erythema and lips, buccal mucosa, and tongue normal  NECK: supple, thyroid normal size, non-tender, without nodularity LYMPH:  no palpable lymphadenopathy in the cervical, axillary or inguinal LUNGS: clear to auscultation and percussion with normal breathing effort HEART: regular rate & rhythm and no murmurs and no lower extremity edema ABDOMEN:abdomen soft, non-tender and normal bowel sounds MUSCULOSKELETAL:no cyanosis of digits and no clubbing  NEURO: alert & oriented x 3 with fluent speech, no focal motor/sensory deficits, memory impairment EXTREMITIES: No lower extremity edema  LABORATORY DATA:  I have reviewed the data as listed   Chemistry      Component Value Date/Time   NA 142 11/09/2015 1507   NA 147* 10/31/2015 1756   K 4.0 11/09/2015 1507   K 3.3* 10/31/2015 1756   CL 110 10/31/2015 1756   CO2 22 11/09/2015 1507   CO2 23 10/31/2015 1756   BUN 14.8 11/09/2015 1507   BUN 14 10/31/2015 1756   CREATININE 0.8 11/09/2015 1507   CREATININE 1.06* 10/31/2015 1756      Component Value Date/Time   CALCIUM 7.4* 11/09/2015 1507   CALCIUM 7.7* 10/31/2015 1756   ALKPHOS 36* 10/31/2015 1756   ALKPHOS 42 10/11/2015 1010   AST 17 10/31/2015 1756   AST 13 10/11/2015 1010   ALT 23 10/31/2015 1756   ALT 17 10/11/2015 1010   BILITOT 0.5 10/31/2015 1756   BILITOT 1.31* 10/11/2015 1010       Lab Results  Component Value Date   WBC 4.3 10/31/2015   HGB 11.0* 10/31/2015   HCT 33.3* 10/31/2015   MCV 95.1 10/31/2015   PLT 214 10/31/2015   NEUTROABS 3.0 10/31/2015     ASSESSMENT & PLAN:  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 and MGMT Methylation: Not Detected) Suggests Poorer response to chemo-XRT and Poor prognosis ----------------------------------------------------------------------------------------------------------------------------------------------------------- Treatment summary:Concurrent chemoradiation with daily temozolomide at 75 mg/m (180 mg dose) started 08/29/2015 completed 10/11/2015 Temozolomide toxicities: nausea, decreased taste, decreased appetite, constipation MRI brain 10/23/2015: Enhancement right temporal lobe cavity 4.3 x 3.7 cm previously 3.3 cm, second right frontal lobe nodule 3 x 3.3 cm, previously 2.9 cm, right mesial frontal lobe nodule 11 mm.  Treatment plan: Avastin 10 mg/kg every 2 weeks. Plan to start treatment after port is in place. Severe dental discomfort:Patient sees a dentist  Prognosis:her prognosis is guarded.  Patient had an appointment for port placement and she totally missed it because she has trouble with memory. We set her up for a port placement for tomorrow. She had already forgotten about it. I provided her with written  instructions for her port placement for tomorrow.  Return to clinic with cycle 2 Avastin.  No orders of the defined types were placed in this encounter.   The patient has a good understanding of the overall plan. she agrees with it. she will call with any problems that may develop before the next visit here.   Rulon Eisenmenger, MD 11/28/2015

## 2015-11-28 NOTE — Progress Notes (Signed)
Unable to get in to exam room prior to MD.  No assessment performed.  

## 2015-11-28 NOTE — Assessment & Plan Note (Signed)
Brain MRI 07/24/2015: Heterogeneous enhancing right frontal lobe mass 4.7 x 3.6 x 3.3 cm with vasogenic edema, heterogeneous enhancing mid right temporal lobe mass 4.1 x 3.1 x 2.2 cm with edema and right-to-left shift and mass effect. 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 and MGMT Methylation: Not Detected) Suggests Poorer response to chemo-XRT and Poor prognosis ----------------------------------------------------------------------------------------------------------------------------------------------------------- Treatment summary:Concurrent chemoradiation with daily temozolomide at 75 mg/m (180 mg dose) started 08/29/2015 completed 10/11/2015 Temozolomide toxicities: nausea, decreased taste, decreased appetite, constipation MRI brain 10/23/2015: Enhancement right temporal lobe cavity 4.3 x 3.7 cm previously 3.3 cm, second right frontal lobe nodule 3 x 3.3 cm, previously 2.9 cm, right mesial frontal lobe nodule 11 mm.  Treatment plan: Avastin 10 mg/kg every 2 weeks. Plan to start treatment in 2 weeks after port has been placed. Severe dental discomfort:Patient sees a dentist  Prognosis:her prognosis is guarded. Return to clinic Avastin

## 2015-11-29 ENCOUNTER — Other Ambulatory Visit: Payer: Self-pay | Admitting: Hematology and Oncology

## 2015-11-29 ENCOUNTER — Ambulatory Visit (HOSPITAL_COMMUNITY)
Admission: RE | Admit: 2015-11-29 | Discharge: 2015-11-29 | Disposition: A | Discharge status: Home / Self Care | Payer: BC Managed Care – PPO | Source: Ambulatory Visit | Attending: Hematology and Oncology | Admitting: Hematology and Oncology

## 2015-11-29 ENCOUNTER — Telehealth: Payer: Self-pay | Admitting: Hematology and Oncology

## 2015-11-29 ENCOUNTER — Encounter (HOSPITAL_COMMUNITY): Payer: Self-pay

## 2015-11-29 DIAGNOSIS — E039 Hypothyroidism, unspecified: Secondary | ICD-10-CM | POA: Diagnosis not present

## 2015-11-29 DIAGNOSIS — Z9221 Personal history of antineoplastic chemotherapy: Secondary | ICD-10-CM | POA: Diagnosis not present

## 2015-11-29 DIAGNOSIS — Z923 Personal history of irradiation: Secondary | ICD-10-CM | POA: Diagnosis not present

## 2015-11-29 DIAGNOSIS — C711 Malignant neoplasm of frontal lobe: Secondary | ICD-10-CM | POA: Insufficient documentation

## 2015-11-29 DIAGNOSIS — E119 Type 2 diabetes mellitus without complications: Secondary | ICD-10-CM | POA: Diagnosis not present

## 2015-11-29 DIAGNOSIS — Z8249 Family history of ischemic heart disease and other diseases of the circulatory system: Secondary | ICD-10-CM | POA: Insufficient documentation

## 2015-11-29 DIAGNOSIS — I10 Essential (primary) hypertension: Secondary | ICD-10-CM | POA: Insufficient documentation

## 2015-11-29 DIAGNOSIS — Z87891 Personal history of nicotine dependence: Secondary | ICD-10-CM | POA: Diagnosis not present

## 2015-11-29 DIAGNOSIS — M797 Fibromyalgia: Secondary | ICD-10-CM | POA: Insufficient documentation

## 2015-11-29 DIAGNOSIS — R569 Unspecified convulsions: Secondary | ICD-10-CM | POA: Diagnosis not present

## 2015-11-29 DIAGNOSIS — F419 Anxiety disorder, unspecified: Secondary | ICD-10-CM | POA: Insufficient documentation

## 2015-11-29 DIAGNOSIS — Z794 Long term (current) use of insulin: Secondary | ICD-10-CM | POA: Insufficient documentation

## 2015-11-29 DIAGNOSIS — K219 Gastro-esophageal reflux disease without esophagitis: Secondary | ICD-10-CM | POA: Insufficient documentation

## 2015-11-29 DIAGNOSIS — E559 Vitamin D deficiency, unspecified: Secondary | ICD-10-CM | POA: Diagnosis not present

## 2015-11-29 DIAGNOSIS — C719 Malignant neoplasm of brain, unspecified: Secondary | ICD-10-CM

## 2015-11-29 DIAGNOSIS — Z79899 Other long term (current) drug therapy: Secondary | ICD-10-CM | POA: Insufficient documentation

## 2015-11-29 LAB — CBC WITH DIFFERENTIAL/PLATELET
BASOS ABS: 0 10*3/uL (ref 0.0–0.1)
BASOS PCT: 0 %
EOS ABS: 0 10*3/uL (ref 0.0–0.7)
Eosinophils Relative: 0 %
HEMATOCRIT: 37 % (ref 36.0–46.0)
HEMOGLOBIN: 12.6 g/dL (ref 12.0–15.0)
Lymphocytes Relative: 12 %
Lymphs Abs: 0.8 10*3/uL (ref 0.7–4.0)
MCH: 32.6 pg (ref 26.0–34.0)
MCHC: 34.1 g/dL (ref 30.0–36.0)
MCV: 95.9 fL (ref 78.0–100.0)
MONOS PCT: 7 %
Monocytes Absolute: 0.5 10*3/uL (ref 0.1–1.0)
NEUTROS ABS: 5.7 10*3/uL (ref 1.7–7.7)
NEUTROS PCT: 81 %
Platelets: 242 10*3/uL (ref 150–400)
RBC: 3.86 MIL/uL — AB (ref 3.87–5.11)
RDW: 14.9 % (ref 11.5–15.5)
WBC: 7.1 10*3/uL (ref 4.0–10.5)

## 2015-11-29 LAB — BASIC METABOLIC PANEL
ANION GAP: 8 (ref 5–15)
BUN: 23 mg/dL — ABNORMAL HIGH (ref 6–20)
CALCIUM: 8.8 mg/dL — AB (ref 8.9–10.3)
CO2: 25 mmol/L (ref 22–32)
Chloride: 108 mmol/L (ref 101–111)
Creatinine, Ser: 0.73 mg/dL (ref 0.44–1.00)
Glucose, Bld: 151 mg/dL — ABNORMAL HIGH (ref 65–99)
Potassium: 4.2 mmol/L (ref 3.5–5.1)
SODIUM: 141 mmol/L (ref 135–145)

## 2015-11-29 LAB — PROTIME-INR
INR: 0.97 (ref 0.00–1.49)
PROTHROMBIN TIME: 13.1 s (ref 11.6–15.2)

## 2015-11-29 MED ORDER — LIDOCAINE-EPINEPHRINE 1 %-1:100000 IJ SOLN
INTRAMUSCULAR | Status: AC | PRN
  Administered 2015-11-29: 10 mL

## 2015-11-29 MED ORDER — HEPARIN SOD (PORK) LOCK FLUSH 100 UNIT/ML IV SOLN
INTRAVENOUS | Status: AC
  Filled 2015-11-29: qty 5

## 2015-11-29 MED ORDER — CEFAZOLIN SODIUM 1 G IJ SOLR
2.0000 g | INTRAMUSCULAR | Status: AC
  Administered 2015-11-29: 2 g via INTRAVENOUS
  Filled 2015-11-29: qty 20

## 2015-11-29 MED ORDER — MIDAZOLAM HCL 2 MG/2ML IJ SOLN
INTRAMUSCULAR | Status: AC
  Filled 2015-11-29: qty 4

## 2015-11-29 MED ORDER — FENTANYL CITRATE (PF) 100 MCG/2ML IJ SOLN
INTRAMUSCULAR | Status: AC | PRN
  Administered 2015-11-29 (×2): 25 ug via INTRAVENOUS

## 2015-11-29 MED ORDER — FENTANYL CITRATE (PF) 100 MCG/2ML IJ SOLN
INTRAMUSCULAR | Status: AC
  Filled 2015-11-29: qty 2

## 2015-11-29 MED ORDER — LIDOCAINE-EPINEPHRINE 2 %-1:100000 IJ SOLN
INTRAMUSCULAR | Status: AC
  Filled 2015-11-29: qty 1

## 2015-11-29 MED ORDER — HEPARIN SOD (PORK) LOCK FLUSH 100 UNIT/ML IV SOLN
INTRAVENOUS | Status: AC | PRN
  Administered 2015-11-29: 500 [IU]

## 2015-11-29 MED ORDER — SODIUM CHLORIDE 0.9 % IV SOLN
INTRAVENOUS | Status: DC
  Administered 2015-11-29: 13:00:00 via INTRAVENOUS

## 2015-11-29 MED ORDER — MIDAZOLAM HCL 2 MG/2ML IJ SOLN
INTRAMUSCULAR | Status: AC | PRN
  Administered 2015-11-29: 0.5 mg via INTRAVENOUS
  Administered 2015-11-29: 1 mg via INTRAVENOUS

## 2015-11-29 NOTE — Sedation Documentation (Signed)
Patient denies pain and is resting comfortably.  

## 2015-11-29 NOTE — Discharge Instructions (Signed)
Implanted Port Home Guide °An implanted port is a type of central line that is placed under the skin. Central lines are used to provide IV access when treatment or nutrition needs to be given through a person's veins. Implanted ports are used for long-term IV access. An implanted port may be placed because:  °· You need IV medicine that would be irritating to the small veins in your hands or arms.   °· You need long-term IV medicines, such as antibiotics.   °· You need IV nutrition for a long period.   °· You need frequent blood draws for lab tests.   °· You need dialysis.   °Implanted ports are usually placed in the chest area, but they can also be placed in the upper arm, the abdomen, or the leg. An implanted port has two main parts:  °· Reservoir. The reservoir is round and will appear as a small, raised area under your skin. The reservoir is the part where a needle is inserted to give medicines or draw blood.   °· Catheter. The catheter is a thin, flexible tube that extends from the reservoir. The catheter is placed into a large vein. Medicine that is inserted into the reservoir goes into the catheter and then into the vein.   °HOW WILL I CARE FOR MY INCISION SITE? °Do not get the incision site wet. Bathe or shower as directed by your health care provider.  °HOW IS MY PORT ACCESSED? °Special steps must be taken to access the port:  °· Before the port is accessed, a numbing cream can be placed on the skin. This helps numb the skin over the port site.   °· Your health care provider uses a sterile technique to access the port. °· Your health care provider must put on a mask and sterile gloves. °· The skin over your port is cleaned carefully with an antiseptic and allowed to dry. °· The port is gently pinched between sterile gloves, and a needle is inserted into the port. °· Only "non-coring" port needles should be used to access the port. Once the port is accessed, a blood return should be checked. This helps  ensure that the port is in the vein and is not clogged.   °· If your port needs to remain accessed for a constant infusion, a clear (transparent) bandage will be placed over the needle site. The bandage and needle will need to be changed every week, or as directed by your health care provider.   °· Keep the bandage covering the needle clean and dry. Do not get it wet. Follow your health care provider's instructions on how to take a shower or bath while the port is accessed.   °· If your port does not need to stay accessed, no bandage is needed over the port.   °WHAT IS FLUSHING? °Flushing helps keep the port from getting clogged. Follow your health care provider's instructions on how and when to flush the port. Ports are usually flushed with saline solution or a medicine called heparin. The need for flushing will depend on how the port is used.  °· If the port is used for intermittent medicines or blood draws, the port will need to be flushed:   °· After medicines have been given.   °· After blood has been drawn.   °· As part of routine maintenance.   °· If a constant infusion is running, the port may not need to be flushed.   °HOW LONG WILL MY PORT STAY IMPLANTED? °The port can stay in for as long as your health care   provider thinks it is needed. When it is time for the port to come out, surgery will be done to remove it. The procedure is similar to the one performed when the port was put in.  °WHEN SHOULD I SEEK IMMEDIATE MEDICAL CARE? °When you have an implanted port, you should seek immediate medical care if:  °· You notice a bad smell coming from the incision site.   °· You have swelling, redness, or drainage at the incision site.   °· You have more swelling or pain at the port site or the surrounding area.   °· You have a fever that is not controlled with medicine. °  °This information is not intended to replace advice given to you by your health care provider. Make sure you discuss any questions you have with  your health care provider. °  °Document Released: 08/19/2005 Document Revised: 06/09/2013 Document Reviewed: 04/26/2013 °Elsevier Interactive Patient Education ©2016 [employer] °Implanted Port Insertion, Care After °Refer to this sheet in the next few weeks. These instructions provide you with information on caring for yourself after your procedure. Your health care provider may also give you more specific instructions. Your treatment has been planned according to current medical practices, but problems sometimes occur. Call your health care provider if you have any problems or questions after your procedure. °WHAT TO EXPECT AFTER THE PROCEDURE °After your procedure, it is typical to have the following:  °· Discomfort at the port insertion site. Ice packs to the area will help. °· Bruising on the skin over the port. This will subside in 3-4 days. °HOME CARE INSTRUCTIONS °· After your port is placed, you will get a manufacturer's information card. The card has information about your port. Keep this card with you at all times.   °· Know what kind of port you have. There are many types of ports available.   °· Wear a medical alert bracelet in case of an emergency. This can help alert health care workers that you have a port.   °· The port can stay in for as long as your health care provider believes it is necessary.   °· A home health care nurse may give medicines and take care of the port.   °· You or a family member can get special training and directions for giving medicine and taking care of the port at home.   °SEEK MEDICAL CARE IF:  °· Your port does not flush or you are unable to get a blood return.   °· You have a fever or chills. °SEEK IMMEDIATE MEDICAL CARE IF: °· You have new fluid or pus coming from your incision.   °· You notice a bad smell coming from your incision site.   °· You have swelling, pain, or more redness at the incision or port site.   °· You have chest pain or shortness of breath. °  °This  information is not intended to replace advice given to you by your health care provider. Make sure you discuss any questions you have with your health care provider. °  °Document Released: 06/09/2013 Document Revised: 08/24/2013 Document Reviewed: 06/09/2013 °Elsevier Interactive Patient Education ©2016 [employer] °Moderate Conscious Sedation, Adult, Care After °Refer to this sheet in the next few weeks. These instructions provide you with information on caring for yourself after your procedure. Your health care provider may also give you more specific instructions. Your treatment has been planned according to current medical practices, but problems sometimes occur. Call your health care provider if you have any problems or questions after your   procedure. °WHAT TO EXPECT AFTER THE PROCEDURE  °After your procedure: °· You may feel sleepy, clumsy, and have poor balance for several hours. °· Vomiting may occur if you eat too soon after the procedure. °HOME CARE INSTRUCTIONS °· Do not participate in any activities where you could become injured for at least 24 hours. Do not: °¨ Drive. °¨ Swim. °¨ Ride a bicycle. °¨ Operate heavy machinery. °¨ Cook. °¨ Use power tools. °¨ Climb ladders. °¨ Work from a high place. °· Do not make important decisions or sign legal documents until you are improved. °· If you vomit, drink water, juice, or soup when you can drink without vomiting. Make sure you have little or no nausea before eating solid foods. °· Only take over-the-counter or prescription medicines for pain, discomfort, or fever as directed by your health care provider. °· Make sure you and your family fully understand everything about the medicines given to you, including what side effects may occur. °· You should not drink alcohol, take sleeping pills, or take medicines that cause drowsiness for at least 24 hours. °· If you smoke, do not smoke without supervision. °· If you are feeling better, you may resume normal  activities 24 hours after you were sedated. °· Keep all appointments with your health care provider. °SEEK MEDICAL CARE IF: °· Your skin is pale or bluish in color. °· You continue to feel nauseous or vomit. °· Your pain is getting worse and is not helped by medicine. °· You have bleeding or swelling. °· You are still sleepy or feeling clumsy after 24 hours. °SEEK IMMEDIATE MEDICAL CARE IF: °· You develop a rash. °· You have difficulty breathing. °· You develop any type of allergic problem. °· You have a fever. °MAKE SURE YOU: °· Understand these instructions. °· Will watch your condition. °· Will get help right away if you are not doing well or get worse. °  °This information is not intended to replace advice given to you by your health care provider. Make sure you discuss any questions you have with your health care provider. °  °Document Released: 06/09/2013 Document Revised: 09/09/2014 Document Reviewed: 06/09/2013 °Elsevier Interactive Patient Education ©2016 Elsevier Inc. ° °

## 2015-11-29 NOTE — Procedures (Signed)
Successful placement of right IJ approach port-a-cath with tip at the superior caval atrial junction. The catheter is ready for immediate use. Estimated Blood Loss: Minimal No immediate post procedural complications.  Jay Colbie Sliker, MD Pager #: [phone]   

## 2015-11-29 NOTE — Telephone Encounter (Signed)
Left vm to inform pt of appt change 4/7 to 4/13 date/time

## 2015-11-29 NOTE — H&P (Signed)
Chief Complaint: Patient was seen in consultation today for Port-A-Cath placement  Referring Physician(s): Eros  Supervising Physician: Sandi Mariscal  History of Present Illness: Michelle Duran is a 60 y.o. female with history of glioblastoma multiforme right frontal lobe 07/2015 status post biopsy and chemoradiation with Temodar. Recent MRI of the brain has revealed marked interval progression of the tumor and patient presents today for Port-A-Cath placement for Avastin therapy. Additional past medical history as below.  Past Medical History  Diagnosis Date  . Gallstone   . Hypertension   . Hypothyroidism   . Mitral valve prolapse   . Fibromyalgia   . Anxiety   . Vitamin D deficiency   . Diabetes mellitus without complication ([place])   . Tobacco abuse   . Mass of adrenal gland ([place])   . Wears glasses   . Complication of anesthesia     Pt was shaking after having epidural  during c-section  . PVC's (premature ventricular contractions)     saw cardiologist Dr. Claiborne Billings > 3 years ago with echo and stress (entered 08/08/2015)  . Vertigo   . Numbness and tingling     fingers and toes  . Arthritis   . GERD (gastroesophageal reflux disease)   . History of hiatal hernia   . Nocturia   . Family history of adverse reaction to anesthesia     mother had PONV  . Panic attacks   . Neoplasm of brain ([place])   . Seizures [facility])     Past Surgical History  Procedure Laterality Date  . Cesarean section    . Multiple tooth extractions    . Esophagus stretched    . Craniotomy Right 08/09/2015    Procedure: Right Frontotemporal craniotomy for tumor resection with stereotactic navigation;  Surgeon: Kevan Ny Ditty, MD;  Location: [facility];  Service: Neurosurgery;  Laterality: Right;  Right Frontotemporal craniotomy for tumor resection with stereotactic navigation    Allergies: Codeine; Prednisone; and Sulfonamide derivatives  Medications: Prior to Admission medications     Medication Sig Start Date End Date Taking? Authorizing Provider  acetaminophen (TYLENOL) 650 MG CR tablet Take 1,300 mg by mouth every 8 (eight) hours as needed for pain. Reported on 11/09/2015    Historical Provider, MD  B-D ULTRAFINE III SHORT PEN 31G X 8 MM MISC  11/03/15   Historical Provider, MD  clonazePAM (KLONOPIN) 0.5 MG disintegrating tablet DISSOLVE 1 TABLET IN MOUTH EVERY 12 HOURS AS NEEDED SEIZURE 11/01/15   Historical Provider, MD  dexamethasone (DECADRON) 4 MG tablet Take 1 tablet (4 mg total) by mouth daily. 11/28/15   Nicholas Lose, MD  Insulin Glargine (LANTUS) 100 UNIT/ML Solostar Pen Inject 8 Units into the skin daily at 10 pm. 11/28/15   Nicholas Lose, MD  levETIRAcetam (KEPPRA) 500 MG tablet Take 3 tablets (1,500 mg total) by mouth 2 (two) times daily. 11/09/15   Knox Royalty, NP  LORazepam (ATIVAN) 0.5 MG tablet Reported on 11/09/2015 09/27/15   Historical Provider, MD  pantoprazole (PROTONIX) 40 MG tablet Take 1 tablet (40 mg total) by mouth daily. Switch for any other PPI at similar dose and frequency 09/20/15   Hayden Pedro, PA-C  sertraline (ZOLOFT) 100 MG tablet Take 150 mg by mouth at bedtime. Reported on 11/09/2015    Historical Provider, MD  SYNTHROID 25 MCG tablet Take 12.5 mcg by mouth daily before breakfast.  10/13/15   Historical Provider, MD     Family History  Problem Relation  Age of Onset  . Hypertension Father   . Glaucoma    . Ovarian cancer Cousin   . Breast cancer Paternal Grandmother     Social History   Social History  . Marital Status: Divorced    Spouse Name: N/A  . Number of Children: 1  . Years of Education: N/A   Occupational History  . cafateria Freight forwarder    Social History Main Topics  . Smoking status: Former Smoker -- 1.00 packs/day for 41 years    Types: Cigarettes    Quit date: 07/24/2015  . Smokeless tobacco: Never Used  . Alcohol Use: No  . Drug Use: No  . Sexual Activity: Not Currently   Other Topics Concern  . Not on file    Social History Narrative      Review of Systems  Constitutional: Negative for fever and chills.  Respiratory: Negative for cough and shortness of breath.   Cardiovascular: Negative for chest pain.  Gastrointestinal: Negative for nausea, vomiting and abdominal pain.  Genitourinary: Negative for dysuria and hematuria.  Musculoskeletal: Negative for back pain.  Neurological: Negative for headaches.  Psychiatric/Behavioral:       Some occasional forgetfulness    Vital Signs: Blood pressure 122/83, heart rate 54, temp 97.9, respirations 16, O2 sats 98% room air  Physical Exam  Constitutional: She is oriented to person, place, and time. She appears well-developed and well-nourished.  Surgical scar rt frontal region of scalp  Cardiovascular:  Bradycardic but regular rhythm  Pulmonary/Chest: Effort normal and breath sounds normal.  Abdominal: Soft. Bowel sounds are normal. There is no tenderness.  obese  Musculoskeletal: She exhibits no edema.  Neurological: She is alert and oriented to person, place, and time.    Mallampati Score:     Imaging: Mr Jeri Cos [id number] Contrast  10/31/2015  CLINICAL DATA:  Status post 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 Recent seizure. History of hypertension, diabetes. EXAM: MRI HEAD WITHOUT AND WITH CONTRAST TECHNIQUE: Multiplanar, multiecho pulse sequences of the brain and surrounding structures were obtained without and with intravenous contrast. CONTRAST:  51mL MULTIHANCE GADOBENATE DIMEGLUMINE 529 MG/ML IV SOLN COMPARISON:  MRI of the brain August 30, 2015 FINDINGS: Thick irregular enhancement of RIGHT temporal lobe resection cavity, dominant component measuring 4.3 x 3.7 cm, previously 3.3 x 1.9 cm. Second RIGHT frontal lobe resection cavity shows markedly thickened nodular enhancement, progressed from prior examination, in total measuring 3 x 3.3 cm, previously 2.5 x 2.9 cm. RIGHT mesial frontal  lobe nodule was sub cm, now 11 mm. In addition, new 9 mm LEFT mesial frontal lobe nodule, and multiple new subcentimeter enhancing nodules in the bifrontal lobes. Extensive FLAIR T2 hyperintense signal surrounding the resection cavities and the new masses, with additional slightly more conspicuous subcentimeter foci of FLAIR T2 hyperintense signal LEFT frontoparietal deep white matter. Reduced diffusion associated with the nodular enhancement without reduced diffusion to suggest acute ischemia. No midline shift. No hydrocephalus. No abnormal extra-axial fluid collections. Thickened RIGHT cerebrum dural enhancement consistent with recent RIGHT craniotomy. Normal major intracranial vascular flow voids present skull base. Ocular globes and orbital contents are unremarkable. Increasing RIGHT mastoid effusion. Paranasal sinus are well aerated. Ocular globes and orbital contents are normal. IMPRESSION: Marked interval progression of multifocal GBM, with recurrent disease along the margin of the RIGHT frontal and RIGHT temporal lobe resection cavities. New bifrontal lobe small masses. Extensive abnormal FLAIR signal surrounding the enhancing masses suggesting a component of nonenhancing tumor. In  addition, more conspicuous non nonenhancing abnormal signal in the LEFT frontoparietal white matter concerning for early tumor. Acute findings discussed with and reconfirmed by Dr.FLOYD on 10/31/2015 at 9:35 pm. Electronically Signed   By: Elon Alas M.D.   On: 10/31/2015 21:38    Labs:  CBC:  Recent Labs  09/05/15 1040 09/26/15 0918 10/11/15 1010 10/31/15 1749 10/31/15 1756  WBC 10.5* 8.3 6.0  --  4.3  HGB 12.2 13.6 12.3 11.2* 11.0*  HCT 36.0 39.6 36.3 33.0* 33.3*  PLT 308 371 215  --  214    COAGS:  Recent Labs  07/24/15 1530 10/31/15 1756  INR 0.99 1.14  APTT 30 25    BMP:  Recent Labs  07/25/15 0418 08/07/15 1545  08/10/15 0322  10/09/15 1158 10/11/15 1010 10/31/15 1749  10/31/15 1756 11/09/15 1507  NA 137 134*  < > 135  < > 145 143 146* 147* 142  K 3.9 4.5  < > 4.6  < > 3.1* 3.1* 3.2* 3.3* 4.0  CL 105 101  --  102  --   --   --  106 110  --   CO2 25 28  --  26  < > 27 21*  --  23 22  GLUCOSE 230* 239*  --  225*  < > 154* 122 116* 120* 168*  BUN 9 31*  --  17  < > 18.3 16.1 14 14  14.8  CALCIUM 9.2 9.0  --  8.6*  < > 9.1 8.7  --  7.7* 7.4*  CREATININE 0.83 0.95  --  0.91  < > 1.1 1.0 0.90 1.06* 0.8  GFRNONAA >60 >60  --  >60  --   --   --   --  57*  --   GFRAA >60 >60  --  >60  --   --   --   --  >60  --   < > = values in this interval not displayed.  LIVER FUNCTION TESTS:  Recent Labs  09/26/15 0918 10/09/15 1158 10/11/15 1010 10/31/15 1756  BILITOT 0.51 1.19 1.31* 0.5  AST 14 15 13 17   ALT 15 19 17 23   ALKPHOS 56 44 42 36*  PROT 6.3* 6.0* 5.5* 5.1*  ALBUMIN 3.6 3.6 3.4* 3.2*    TUMOR MARKERS: No results for input(s): AFPTM, CEA, CA199, CHROMGRNA in the last 8760 hours.  Assessment and Plan: 60 y.o. female with history of glioblastoma multiforme right frontal lobe 07/2015 status post biopsy and chemoradiation with Temodar. Recent MRI of the brain has revealed marked interval progression of the tumor and patient presents today for Port-A-Cath placement for Avastin therapy.Risks and benefits discussed with the patient/parents including, but not limited to bleeding, infection, pneumothorax, or fibrin sheath development and need for additional procedures. All of the patient's questions were answered, patient is agreeable to proceed.Consent signed and in chart. Labs pending.      Thank you for this interesting consult.  I greatly enjoyed meeting Michelle Duran and look forward to participating in their care.  A copy of this report was sent to the requesting provider on this date.  Electronically Signed: D. Rowe Robert 11/29/2015, 1:16 PM   I spent a total of 20 minutes in face to face in clinical consultation, greater than 50% of which was  counseling/coordinating care for Port-A-Cath placement

## 2015-12-04 ENCOUNTER — Encounter: Payer: Self-pay | Admitting: *Deleted

## 2015-12-04 NOTE — Progress Notes (Signed)
[place] Work  Clinical Social Work was referred by nurse for assessment of psychosocial needs and issues with adjustment to illness.  Clinical Social Worker has made several attempts to contact pt at home via her phone and her mailbox is full. CSW will met with patient at [facility] on 4/13 during treatment to offer support and assess for needs.      Loren Racer, [place] Worker [place]  [place] Phone: [phone] Fax: [phone]

## 2015-12-08 ENCOUNTER — Ambulatory Visit: Payer: Self-pay

## 2015-12-08 ENCOUNTER — Ambulatory Visit: Payer: Self-pay | Admitting: Hematology and Oncology

## 2015-12-14 ENCOUNTER — Ambulatory Visit (HOSPITAL_BASED_OUTPATIENT_CLINIC_OR_DEPARTMENT_OTHER): Payer: BC Managed Care – PPO

## 2015-12-14 ENCOUNTER — Other Ambulatory Visit (HOSPITAL_BASED_OUTPATIENT_CLINIC_OR_DEPARTMENT_OTHER): Payer: BC Managed Care – PPO

## 2015-12-14 ENCOUNTER — Ambulatory Visit: Payer: BC Managed Care – PPO

## 2015-12-14 VITALS — BP 137/71 | HR 48 | Temp 97.7°F | Resp 98

## 2015-12-14 DIAGNOSIS — Z5112 Encounter for antineoplastic immunotherapy: Secondary | ICD-10-CM | POA: Diagnosis not present

## 2015-12-14 DIAGNOSIS — C712 Malignant neoplasm of temporal lobe: Secondary | ICD-10-CM

## 2015-12-14 DIAGNOSIS — C719 Malignant neoplasm of brain, unspecified: Secondary | ICD-10-CM

## 2015-12-14 DIAGNOSIS — C711 Malignant neoplasm of frontal lobe: Secondary | ICD-10-CM

## 2015-12-14 DIAGNOSIS — Z95828 Presence of other vascular implants and grafts: Secondary | ICD-10-CM

## 2015-12-14 LAB — CBC WITH DIFFERENTIAL/PLATELET
BASO%: 0 % (ref 0.0–2.0)
BASOS ABS: 0 10*3/uL (ref 0.0–0.1)
EOS%: 0.1 % (ref 0.0–7.0)
Eosinophils Absolute: 0 10*3/uL (ref 0.0–0.5)
HCT: 39.1 % (ref 34.8–46.6)
HEMOGLOBIN: 13.3 g/dL (ref 11.6–15.9)
LYMPH#: 0.9 10*3/uL (ref 0.9–3.3)
LYMPH%: 11.1 % — ABNORMAL LOW (ref 14.0–49.7)
MCH: 33.3 pg (ref 25.1–34.0)
MCHC: 34 g/dL (ref 31.5–36.0)
MCV: 98 fL (ref 79.5–101.0)
MONO#: 0.7 10*3/uL (ref 0.1–0.9)
MONO%: 9 % (ref 0.0–14.0)
NEUT#: 6.2 10*3/uL (ref 1.5–6.5)
NEUT%: 79.8 % — AB (ref 38.4–76.8)
Platelets: 227 10*3/uL (ref 145–400)
RBC: 3.99 10*6/uL (ref 3.70–5.45)
RDW: 14.6 % — AB (ref 11.2–14.5)
WBC: 7.8 10*3/uL (ref 3.9–10.3)
nRBC: 0 % (ref 0–0)

## 2015-12-14 LAB — COMPREHENSIVE METABOLIC PANEL
ALT: 24 U/L (ref 0–55)
AST: 11 U/L (ref 5–34)
Albumin: 3.1 g/dL — ABNORMAL LOW (ref 3.5–5.0)
Alkaline Phosphatase: 63 U/L (ref 40–150)
Anion Gap: 10 mEq/L (ref 3–11)
BUN: 24.3 mg/dL (ref 7.0–26.0)
CHLORIDE: 108 meq/L (ref 98–109)
CO2: 24 meq/L (ref 22–29)
Calcium: 8.8 mg/dL (ref 8.4–10.4)
Creatinine: 0.8 mg/dL (ref 0.6–1.1)
EGFR: 83 mL/min/{1.73_m2} — AB (ref >90)
GLUCOSE: 148 mg/dL — AB (ref 70–140)
Potassium: 4 mEq/L (ref 3.5–5.1)
SODIUM: 142 meq/L (ref 136–145)
Total Bilirubin: <0.3 mg/dL (ref 0.20–1.20)
Total Protein: 5.5 g/dL — ABNORMAL LOW (ref 6.4–8.3)

## 2015-12-14 LAB — UA PROTEIN, DIPSTICK - CHCC: Protein, ur: <30 mg/dL

## 2015-12-14 MED ORDER — SODIUM CHLORIDE 0.9% FLUSH
10.0000 mL | INTRAVENOUS | Status: DC | PRN
  Administered 2015-12-14: 10 mL via INTRAVENOUS
  Filled 2015-12-14: qty 10

## 2015-12-14 MED ORDER — SODIUM CHLORIDE 0.9 % IV SOLN
10.0000 mg/kg | Freq: Once | INTRAVENOUS | Status: AC
  Administered 2015-12-14: 750 mg via INTRAVENOUS
  Filled 2015-12-14: qty 30

## 2015-12-14 MED ORDER — SODIUM CHLORIDE 0.9% FLUSH
10.0000 mL | INTRAVENOUS | Status: DC | PRN
  Administered 2015-12-14: 10 mL
  Filled 2015-12-14: qty 10

## 2015-12-14 MED ORDER — HEPARIN SOD (PORK) LOCK FLUSH 100 UNIT/ML IV SOLN
500.0000 [IU] | Freq: Once | INTRAVENOUS | Status: AC | PRN
  Administered 2015-12-14: 500 [IU]
  Filled 2015-12-14: qty 5

## 2015-12-14 MED ORDER — SODIUM CHLORIDE 0.9 % IV SOLN
Freq: Once | INTRAVENOUS | Status: AC
  Administered 2015-12-14: 10:00:00 via INTRAVENOUS

## 2015-12-14 NOTE — Patient Instructions (Signed)

## 2015-12-14 NOTE — Patient Instructions (Signed)
[facility] Discharge Instructions for Patients Receiving Chemotherapy  Today you received the following chemotherapy agents: Avastin.  To help prevent nausea and vomiting after your treatment, we encourage you to take your nausea medication as directed.   If you develop nausea and vomiting that is not controlled by your nausea medication, call the clinic.   BELOW ARE SYMPTOMS THAT SHOULD BE REPORTED IMMEDIATELY:  *FEVER GREATER THAN 100.5 F  *CHILLS WITH OR WITHOUT FEVER  NAUSEA AND VOMITING THAT IS NOT CONTROLLED WITH YOUR NAUSEA MEDICATION  *UNUSUAL SHORTNESS OF BREATH  *UNUSUAL BRUISING OR BLEEDING  TENDERNESS IN MOUTH AND THROAT WITH OR WITHOUT PRESENCE OF ULCERS  *URINARY PROBLEMS  *BOWEL PROBLEMS  UNUSUAL RASH Items with * indicate a potential emergency and should be followed up as soon as possible.  Feel free to call the clinic you have any questions or concerns. The clinic phone number is [phone].  Please show the CHEMO ALERT CARD at check-in to the Emergency Department and triage nurse.   Bevacizumab injection What is this medicine? BEVACIZUMAB (be va SIZ yoo mab) is a monoclonal antibody. It is used to treat cervical cancer, colorectal cancer, glioblastoma multiforme, non-small cell lung cancer (NSCLC), ovarian cancer, and renal cell cancer. This medicine may be used for other purposes; ask your health care provider or pharmacist if you have questions. What should I tell my health care provider before I take this medicine? They need to know if you have any of these conditions: -blood clots -heart disease, including heart failure, heart attack, or chest pain (angina) -high blood pressure -infection (especially a virus infection such as chickenpox, cold sores, or herpes) -kidney disease -lung disease -prior chemotherapy with doxorubicin, daunorubicin, epirubicin, or other anthracycline type chemotherapy agents -recent or ongoing radiation  therapy -recent surgery -stroke -an unusual or allergic reaction to bevacizumab, hamster proteins, mouse proteins, other medicines, foods, dyes, or preservatives -pregnant or trying to get pregnant -breast-feeding How should I use this medicine? This medicine is for infusion into a vein. It is given by a health care professional in a hospital or clinic setting. Talk to your pediatrician regarding the use of this medicine in children. Special care may be needed. Overdosage: If you think you have taken too much of this medicine contact a poison control center or emergency room at once. NOTE: This medicine is only for you. Do not share this medicine with others. What if I miss a dose? It is important not to miss your dose. Call your doctor or health care professional if you are unable to keep an appointment. What may interact with this medicine? Interactions are not expected. This list may not describe all possible interactions. Give your health care provider a list of all the medicines, herbs, non-prescription drugs, or dietary supplements you use. Also tell them if you smoke, drink alcohol, or use illegal drugs. Some items may interact with your medicine. What should I watch for while using this medicine? Your condition will be monitored carefully while you are receiving this medicine. You will need important blood work and urine testing done while you are taking this medicine. During your treatment, let your health care professional know if you have any unusual symptoms, such as difficulty breathing. This medicine may rarely cause 'gastrointestinal perforation' (holes in the stomach, intestines or colon), a serious side effect requiring surgery to repair. This medicine should be started at least 28 days following major surgery and the site of the surgery should be totally   healed. Check with your doctor before scheduling dental work or surgery while you are receiving this treatment. Talk to your  doctor if you have recently had surgery or if you have a wound that has not healed. Do not become pregnant while taking this medicine or for 6 months after stopping it. Women should inform their doctor if they wish to become pregnant or think they might be pregnant. There is a potential for serious side effects to an unborn child. Talk to your health care professional or pharmacist for more information. Do not breast-feed an infant while taking this medicine. This medicine has caused ovarian failure in some women. This medicine may interfere with the ability to have a child. You should talk to your doctor or health care professional if you are concerned about your fertility. What side effects may I notice from receiving this medicine? Side effects that you should report to your doctor or health care professional as soon as possible: -allergic reactions like skin rash, itching or hives, swelling of the face, lips, or tongue -signs of infection - fever or chills, cough, sore throat, pain or trouble passing urine -signs of decreased platelets or bleeding - bruising, pinpoint red spots on the skin, black, tarry stools, nosebleeds, blood in the urine -breathing problems -changes in vision -chest pain -confusion -jaw pain, especially after dental work -mouth sores -seizures -severe abdominal pain -severe headache -sudden numbness or weakness of the face, arm or leg -swelling of legs or ankles -symptoms of a stroke: change in mental awareness, inability to talk or move one side of the body (especially in patients with lung cancer) -trouble passing urine or change in the amount of urine -trouble speaking or understanding -trouble walking, dizziness, loss of balance or coordination Side effects that usually do not require medical attention (report to your doctor or health care professional if they continue or are bothersome): -constipation -diarrhea -dry skin -headache -loss of appetite -nausea,  vomiting This list may not describe all possible side effects. Call your doctor for medical advice about side effects. You may report side effects to FDA at 1-800-FDA-1088. Where should I keep my medicine? This drug is given in a hospital or clinic and will not be stored at home. NOTE: This sheet is a summary. It may not cover all possible information. If you have questions about this medicine, talk to your doctor, pharmacist, or health care provider.    2016, Elsevier/Gold Standard. (2014-10-18 16:58:44)  

## 2015-12-27 ENCOUNTER — Other Ambulatory Visit: Payer: Self-pay

## 2015-12-28 ENCOUNTER — Other Ambulatory Visit (HOSPITAL_BASED_OUTPATIENT_CLINIC_OR_DEPARTMENT_OTHER): Payer: BC Managed Care – PPO

## 2015-12-28 ENCOUNTER — Ambulatory Visit (HOSPITAL_BASED_OUTPATIENT_CLINIC_OR_DEPARTMENT_OTHER): Payer: BC Managed Care – PPO

## 2015-12-28 ENCOUNTER — Ambulatory Visit: Payer: BC Managed Care – PPO

## 2015-12-28 ENCOUNTER — Telehealth: Payer: Self-pay | Admitting: Hematology and Oncology

## 2015-12-28 ENCOUNTER — Encounter: Payer: Self-pay | Admitting: Hematology and Oncology

## 2015-12-28 ENCOUNTER — Ambulatory Visit (HOSPITAL_BASED_OUTPATIENT_CLINIC_OR_DEPARTMENT_OTHER): Payer: BC Managed Care – PPO | Admitting: Hematology and Oncology

## 2015-12-28 VITALS — BP 126/61 | HR 52

## 2015-12-28 VITALS — BP 149/71 | HR 51 | Temp 98.0°F | Resp 18 | Ht 62.0 in | Wt 173.9 lb

## 2015-12-28 DIAGNOSIS — L271 Localized skin eruption due to drugs and medicaments taken internally: Secondary | ICD-10-CM | POA: Diagnosis not present

## 2015-12-28 DIAGNOSIS — C711 Malignant neoplasm of frontal lobe: Secondary | ICD-10-CM | POA: Diagnosis not present

## 2015-12-28 DIAGNOSIS — C719 Malignant neoplasm of brain, unspecified: Secondary | ICD-10-CM

## 2015-12-28 DIAGNOSIS — C712 Malignant neoplasm of temporal lobe: Secondary | ICD-10-CM

## 2015-12-28 DIAGNOSIS — D496 Neoplasm of unspecified behavior of brain: Secondary | ICD-10-CM

## 2015-12-28 DIAGNOSIS — Z5112 Encounter for antineoplastic immunotherapy: Secondary | ICD-10-CM | POA: Diagnosis not present

## 2015-12-28 LAB — COMPREHENSIVE METABOLIC PANEL
ALBUMIN: 3.1 g/dL — AB (ref 3.5–5.0)
ALT: 38 U/L (ref 0–55)
AST: 13 U/L (ref 5–34)
Alkaline Phosphatase: 52 U/L (ref 40–150)
Anion Gap: 8 mEq/L (ref 3–11)
BUN: 22.9 mg/dL (ref 7.0–26.0)
CHLORIDE: 109 meq/L (ref 98–109)
CO2: 24 mEq/L (ref 22–29)
CREATININE: 0.8 mg/dL (ref 0.6–1.1)
Calcium: 8.7 mg/dL (ref 8.4–10.4)
EGFR: 81 mL/min/{1.73_m2} — ABNORMAL LOW (ref >90)
GLUCOSE: 175 mg/dL — AB (ref 70–140)
POTASSIUM: 3.8 meq/L (ref 3.5–5.1)
SODIUM: 141 meq/L (ref 136–145)
Total Bilirubin: 0.37 mg/dL (ref 0.20–1.20)
Total Protein: 5.5 g/dL — ABNORMAL LOW (ref 6.4–8.3)

## 2015-12-28 LAB — CBC WITH DIFFERENTIAL/PLATELET
BASO%: 0.2 % (ref 0.0–2.0)
BASOS ABS: 0 10*3/uL (ref 0.0–0.1)
EOS%: 0.1 % (ref 0.0–7.0)
Eosinophils Absolute: 0 10*3/uL (ref 0.0–0.5)
HEMATOCRIT: 39.5 % (ref 34.8–46.6)
HGB: 13.3 g/dL (ref 11.6–15.9)
LYMPH#: 1.3 10*3/uL (ref 0.9–3.3)
LYMPH%: 15 % (ref 14.0–49.7)
MCH: 33.4 pg (ref 25.1–34.0)
MCHC: 33.8 g/dL (ref 31.5–36.0)
MCV: 98.8 fL (ref 79.5–101.0)
MONO#: 0.6 10*3/uL (ref 0.1–0.9)
MONO%: 7.4 % (ref 0.0–14.0)
NEUT#: 6.8 10*3/uL — ABNORMAL HIGH (ref 1.5–6.5)
NEUT%: 77.3 % — ABNORMAL HIGH (ref 38.4–76.8)
Platelets: 232 10*3/uL (ref 145–400)
RBC: 3.99 10*6/uL (ref 3.70–5.45)
RDW: 14.8 % — AB (ref 11.2–14.5)
WBC: 8.8 10*3/uL (ref 3.9–10.3)

## 2015-12-28 MED ORDER — HEPARIN SOD (PORK) LOCK FLUSH 100 UNIT/ML IV SOLN
500.0000 [IU] | Freq: Once | INTRAVENOUS | Status: AC | PRN
  Administered 2015-12-28: 500 [IU]
  Filled 2015-12-28: qty 5

## 2015-12-28 MED ORDER — SODIUM CHLORIDE 0.9 % IV SOLN
Freq: Once | INTRAVENOUS | Status: AC
  Administered 2015-12-28: 12:00:00 via INTRAVENOUS

## 2015-12-28 MED ORDER — SODIUM CHLORIDE 0.9 % IV SOLN
10.0000 mg/kg | Freq: Once | INTRAVENOUS | Status: AC
  Administered 2015-12-28: 750 mg via INTRAVENOUS
  Filled 2015-12-28: qty 30

## 2015-12-28 MED ORDER — SODIUM CHLORIDE 0.9% FLUSH
10.0000 mL | INTRAVENOUS | Status: DC | PRN
  Administered 2015-12-28: 10 mL
  Filled 2015-12-28: qty 10

## 2015-12-28 MED ORDER — SODIUM CHLORIDE 0.9 % IJ SOLN
10.0000 mL | INTRAMUSCULAR | Status: DC | PRN
  Administered 2015-12-28: 10 mL via INTRAVENOUS
  Filled 2015-12-28: qty 10

## 2015-12-28 NOTE — Patient Instructions (Signed)
[facility] Discharge Instructions for Patients Receiving Chemotherapy  Today you received the following chemotherapy agents: Avastin.  To help prevent nausea and vomiting after your treatment, we encourage you to take your nausea medication as directed.   If you develop nausea and vomiting that is not controlled by your nausea medication, call the clinic.   BELOW ARE SYMPTOMS THAT SHOULD BE REPORTED IMMEDIATELY:  *FEVER GREATER THAN 100.5 F  *CHILLS WITH OR WITHOUT FEVER  NAUSEA AND VOMITING THAT IS NOT CONTROLLED WITH YOUR NAUSEA MEDICATION  *UNUSUAL SHORTNESS OF BREATH  *UNUSUAL BRUISING OR BLEEDING  TENDERNESS IN MOUTH AND THROAT WITH OR WITHOUT PRESENCE OF ULCERS  *URINARY PROBLEMS  *BOWEL PROBLEMS  UNUSUAL RASH Items with * indicate a potential emergency and should be followed up as soon as possible.  Feel free to call the clinic you have any questions or concerns. The clinic phone number is [phone].  Please show the CHEMO ALERT CARD at check-in to the Emergency Department and triage nurse.   Bevacizumab injection What is this medicine? BEVACIZUMAB (be va SIZ yoo mab) is a monoclonal antibody. It is used to treat cervical cancer, colorectal cancer, glioblastoma multiforme, non-small cell lung cancer (NSCLC), ovarian cancer, and renal cell cancer. This medicine may be used for other purposes; ask your health care provider or pharmacist if you have questions. What should I tell my health care provider before I take this medicine? They need to know if you have any of these conditions: -blood clots -heart disease, including heart failure, heart attack, or chest pain (angina) -high blood pressure -infection (especially a virus infection such as chickenpox, cold sores, or herpes) -kidney disease -lung disease -prior chemotherapy with doxorubicin, daunorubicin, epirubicin, or other anthracycline type chemotherapy agents -recent or ongoing radiation  therapy -recent surgery -stroke -an unusual or allergic reaction to bevacizumab, hamster proteins, mouse proteins, other medicines, foods, dyes, or preservatives -pregnant or trying to get pregnant -breast-feeding How should I use this medicine? This medicine is for infusion into a vein. It is given by a health care professional in a hospital or clinic setting. Talk to your pediatrician regarding the use of this medicine in children. Special care may be needed. Overdosage: If you think you have taken too much of this medicine contact a poison control center or emergency room at once. NOTE: This medicine is only for you. Do not share this medicine with others. What if I miss a dose? It is important not to miss your dose. Call your doctor or health care professional if you are unable to keep an appointment. What may interact with this medicine? Interactions are not expected. This list may not describe all possible interactions. Give your health care provider a list of all the medicines, herbs, non-prescription drugs, or dietary supplements you use. Also tell them if you smoke, drink alcohol, or use illegal drugs. Some items may interact with your medicine. What should I watch for while using this medicine? Your condition will be monitored carefully while you are receiving this medicine. You will need important blood work and urine testing done while you are taking this medicine. During your treatment, let your health care professional know if you have any unusual symptoms, such as difficulty breathing. This medicine may rarely cause 'gastrointestinal perforation' (holes in the stomach, intestines or colon), a serious side effect requiring surgery to repair. This medicine should be started at least 28 days following major surgery and the site of the surgery should be totally   healed. Check with your doctor before scheduling dental work or surgery while you are receiving this treatment. Talk to your  doctor if you have recently had surgery or if you have a wound that has not healed. Do not become pregnant while taking this medicine or for 6 months after stopping it. Women should inform their doctor if they wish to become pregnant or think they might be pregnant. There is a potential for serious side effects to an unborn child. Talk to your health care professional or pharmacist for more information. Do not breast-feed an infant while taking this medicine. This medicine has caused ovarian failure in some women. This medicine may interfere with the ability to have a child. You should talk to your doctor or health care professional if you are concerned about your fertility. What side effects may I notice from receiving this medicine? Side effects that you should report to your doctor or health care professional as soon as possible: -allergic reactions like skin rash, itching or hives, swelling of the face, lips, or tongue -signs of infection - fever or chills, cough, sore throat, pain or trouble passing urine -signs of decreased platelets or bleeding - bruising, pinpoint red spots on the skin, black, tarry stools, nosebleeds, blood in the urine -breathing problems -changes in vision -chest pain -confusion -jaw pain, especially after dental work -mouth sores -seizures -severe abdominal pain -severe headache -sudden numbness or weakness of the face, arm or leg -swelling of legs or ankles -symptoms of a stroke: change in mental awareness, inability to talk or move one side of the body (especially in patients with lung cancer) -trouble passing urine or change in the amount of urine -trouble speaking or understanding -trouble walking, dizziness, loss of balance or coordination Side effects that usually do not require medical attention (report to your doctor or health care professional if they continue or are bothersome): -constipation -diarrhea -dry skin -headache -loss of appetite -nausea,  vomiting This list may not describe all possible side effects. Call your doctor for medical advice about side effects. You may report side effects to FDA at 1-800-FDA-1088. Where should I keep my medicine? This drug is given in a hospital or clinic and will not be stored at home. NOTE: This sheet is a summary. It may not cover all possible information. If you have questions about this medicine, talk to your doctor, pharmacist, or health care provider.    2016, Elsevier/Gold Standard. (2014-10-18 16:58:44)  

## 2015-12-28 NOTE — Telephone Encounter (Signed)
appt made and MRI to be sch by central radiology

## 2015-12-28 NOTE — Patient Instructions (Signed)

## 2015-12-28 NOTE — Progress Notes (Signed)
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   12/14/2015 -  Chemotherapy Avastin 10 mg/kg every 2 weeks    CHIEF COMPLIANT: Follow-up on Avastin  INTERVAL HISTORY: Michelle Duran is a 60 year old with above-mentioned history of glioblastoma multiforme relapsed disease who is here for Avastin cycle 2. She tolerated cycle 1 fairly well. She complains of mild petechial rash on her left hand as well as easy bruising on her thighs and her arms. Her mother is complaining that she has very poor memory as well as some slurring of speech. She is also complaining of esophagitis.  REVIEW OF SYSTEMS:   Constitutional: Denies fevers, chills or abnormal weight loss Eyes: Denies blurriness of vision Ears, nose, mouth, throat, and face: Denies mucositis or sore throat Respiratory: Denies cough, dyspnea or wheezes Cardiovascular: Denies palpitation, chest discomfort Gastrointestinal:  Denies nausea, or change in bowel habits, complains of esophagitis Skin: Denies abnormal skin rashes Lymphatics: Denies new lymphadenopathy or easy bruising Neurological:Denies numbness, tingling or new weaknesses Behavioral/Psych: Mood is stable, no new  changes  Extremities: No lower extremity edema  All other systems were reviewed with the patient and are negative.  I have reviewed the past medical history, past surgical history, social history and family history with the patient and they are unchanged from previous note.  ALLERGIES:  is allergic to codeine; prednisone; and sulfonamide derivatives.  MEDICATIONS:  Current Outpatient Prescriptions  Medication Sig Dispense Refill  . acetaminophen (TYLENOL) 650 MG CR tablet Take 1,300 mg by mouth every 8 (eight) hours as needed for pain. Reported on 11/09/2015    . B-D ULTRAFINE III SHORT PEN 31G X 8 MM MISC     . clonazePAM (KLONOPIN) 0.5 MG disintegrating tablet DISSOLVE 1 TABLET IN MOUTH EVERY 12 HOURS AS NEEDED SEIZURE  0  . dexamethasone (DECADRON) 4 MG tablet Take 1 tablet (4 mg total) by mouth daily.    . Insulin Glargine (LANTUS) 100 UNIT/ML Solostar Pen Inject 8 Units into the skin daily at 10 pm. 15 mL 11  . levETIRAcetam (KEPPRA) 500 MG tablet Take 3 tablets (1,500 mg total) by mouth 2 (two) times daily. 180 tablet 0  . LORazepam (ATIVAN) 0.5 MG tablet Reported on 11/09/2015  3  . pantoprazole (PROTONIX) 40 MG tablet Take 1 tablet (40 mg total) by mouth daily. Switch for any other PPI at similar dose and frequency 30 tablet 4  . sertraline (ZOLOFT) 100 MG tablet Take 150 mg by mouth at bedtime. Reported on 11/09/2015    . SYNTHROID 25 MCG tablet Take 12.5 mcg by mouth daily before breakfast.   0   No current facility-administered medications for this visit.   Facility-Administered Medications Ordered in Other Visits  Medication Dose Route  Frequency Provider Last Rate Last Dose  . SONAFINE emulsion 1 application  1 application Topical BID Tyler Pita, MD   1 application at 67/12/45 1517    PHYSICAL EXAMINATION: ECOG PERFORMANCE STATUS: 1 - Symptomatic but completely ambulatory  Filed Vitals:   12/28/15 1017  BP: 149/71  Pulse: 51  Temp: 98 F (36.7 C)  Resp: 18   Filed  Weights   12/28/15 1017  Weight: 173 lb 14.4 oz (78.881 kg)    GENERAL:alert, no distress and comfortable SKIN: skin color, texture, turgor are normal, no rashes or significant lesions EYES: normal, Conjunctiva are pink and non-injected, sclera clear OROPHARYNX:no exudate, no erythema and lips, buccal mucosa, and tongue normal  NECK: supple, thyroid normal size, non-tender, without nodularity LYMPH:  no palpable lymphadenopathy in the cervical, axillary or inguinal LUNGS: clear to auscultation and percussion with normal breathing effort HEART: regular rate & rhythm and no murmurs and no lower extremity edema ABDOMEN:abdomen soft, non-tender and normal bowel sounds MUSCULOSKELETAL:no cyanosis of digits and no clubbing  NEURO: alert & oriented x 3 with fluent speech, no focal motor/sensory deficits EXTREMITIES: No lower extremity edema  LABORATORY DATA:  I have reviewed the data as listed   Chemistry      Component Value Date/Time   NA 142 12/14/2015 0901   NA 141 11/29/2015 1256   K 4.0 12/14/2015 0901   K 4.2 11/29/2015 1256   CL 108 11/29/2015 1256   CO2 24 12/14/2015 0901   CO2 25 11/29/2015 1256   BUN 24.3 12/14/2015 0901   BUN 23* 11/29/2015 1256   CREATININE 0.8 12/14/2015 0901   CREATININE 0.73 11/29/2015 1256      Component Value Date/Time   CALCIUM 8.8 12/14/2015 0901   CALCIUM 8.8* 11/29/2015 1256   ALKPHOS 63 12/14/2015 0901   ALKPHOS 36* 10/31/2015 1756   AST 11 12/14/2015 0901   AST 17 10/31/2015 1756   ALT 24 12/14/2015 0901   ALT 23 10/31/2015 1756   BILITOT <0.30 12/14/2015 0901   BILITOT 0.5 10/31/2015 1756       Lab Results  Component Value Date   WBC 8.8 12/28/2015   HGB 13.3 12/28/2015   HCT 39.5 12/28/2015   MCV 98.8 12/28/2015   PLT 232 12/28/2015   NEUTROABS 6.8* 12/28/2015   ASSESSMENT & PLAN:  Glioblastoma multiforme of brain ([facility]) Brain MRI 07/24/2015: Heterogeneous enhancing right frontal lobe mass 4.7 x 3.6 x 3.3 cm with  vasogenic edema, heterogeneous enhancing mid right temporal lobe mass 4.1 x 3.1 x 2.2 cm with edema and right-to-left shift and mass effect. 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 and MGMT Methylation: Not Detected) Suggests Poorer response to chemo-XRT and Poor prognosis ----------------------------------------------------------------------------------------------------------------------------------------------------------- Treatment summary:Concurrent chemoradiation with daily temozolomide at 75 mg/m (180 mg dose) started 08/29/2015 completed 10/11/2015 MRI brain 10/23/2015: Enhancement right temporal lobe cavity 4.3 x 3.7 cm previously 3.3 cm, second right frontal lobe nodule 3 x 3.3 cm, previously 2.9 cm, right mesial frontal lobe nodule 11 mm.  Treatment plan: Delay in starting treatment because patient missed appointments for port placement  Avastin 10 mg/kg every 2 weeks starting at 12/14/2015, today is cycle 2  Avastin toxicities:  1. Petechial rash 2. Bruising on the arms and thighs    Return to clinic in 4 weeks for follow-up every 2 weeks for Avastin;  Brain MRI will be done after 4 cycles of Avastin  in the first week of June.  Orders Placed This Encounter  Procedures  . MR Brain W Wo Contrast    Standing Status: Future     Number of Occurrences:      Standing Expiration Date: 12/27/2016    Order Specific Question:  If indicated for the ordered procedure, I authorize the administration of contrast media per Radiology protocol    Answer:  Yes    Order Specific Question:  Reason for Exam (SYMPTOM  OR DIAGNOSIS REQUIRED)    Answer:  [facility] restaging    Order Specific Question:  Preferred imaging location?    Answer:  [facility] (table limit-350 lbs)    Order Specific Question:  What is the patient's sedation requirement?    Answer:  No Sedation    Order  Specific Question:  Does the patient have a pacemaker or implanted devices?    Answer:  No   The patient has a good understanding of the overall plan. she agrees with it. she will call with any problems that may develop before the next visit here.   Rulon Eisenmenger, MD 12/28/2015

## 2015-12-28 NOTE — Assessment & Plan Note (Signed)
Brain MRI 07/24/2015: Heterogeneous enhancing right frontal lobe mass 4.7 x 3.6 x 3.3 cm with vasogenic edema, heterogeneous enhancing mid right temporal lobe mass 4.1 x 3.1 x 2.2 cm with edema and right-to-left shift and mass effect. 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 and MGMT Methylation: Not Detected) Suggests Poorer response to chemo-XRT and Poor prognosis ----------------------------------------------------------------------------------------------------------------------------------------------------------- Treatment summary:Concurrent chemoradiation with daily temozolomide at 75 mg/m (180 mg dose) started 08/29/2015 completed 10/11/2015 MRI brain 10/23/2015: Enhancement right temporal lobe cavity 4.3 x 3.7 cm previously 3.3 cm, second right frontal lobe nodule 3 x 3.3 cm, previously 2.9 cm, right mesial frontal lobe nodule 11 mm.  Treatment plan: Delay in starting treatment because patient missed appointments for port placement  Avastin 10 mg/kg every 2 weeks starting at 12/14/2015, today is cycle 2 Avastin toxicities:  Dental discomfort:  Return to clinic in 4 weeks for follow-up every 2 weeks for Avastin

## 2015-12-28 NOTE — Progress Notes (Signed)
Unable to get in to exam room prior to MD.  No assessment performed.  

## 2016-01-05 ENCOUNTER — Other Ambulatory Visit: Payer: Self-pay | Admitting: *Deleted

## 2016-01-05 DIAGNOSIS — K219 Gastro-esophageal reflux disease without esophagitis: Secondary | ICD-10-CM

## 2016-01-05 DIAGNOSIS — C719 Malignant neoplasm of brain, unspecified: Secondary | ICD-10-CM

## 2016-01-05 MED ORDER — DEXAMETHASONE 4 MG PO TABS: 4.0000 mg | ORAL_TABLET | Freq: Every day | ORAL | Status: DC

## 2016-01-05 MED ORDER — LORAZEPAM 0.5 MG PO TABS: ORAL_TABLET | ORAL | Status: DC

## 2016-01-05 MED ORDER — PANTOPRAZOLE SODIUM 40 MG PO TBEC: 40.0000 mg | DELAYED_RELEASE_TABLET | Freq: Every day | ORAL | Status: DC

## 2016-01-11 ENCOUNTER — Ambulatory Visit (HOSPITAL_BASED_OUTPATIENT_CLINIC_OR_DEPARTMENT_OTHER): Payer: BC Managed Care – PPO

## 2016-01-11 ENCOUNTER — Ambulatory Visit (HOSPITAL_BASED_OUTPATIENT_CLINIC_OR_DEPARTMENT_OTHER): Payer: BC Managed Care – PPO | Admitting: Nurse Practitioner

## 2016-01-11 ENCOUNTER — Ambulatory Visit: Payer: BC Managed Care – PPO

## 2016-01-11 ENCOUNTER — Other Ambulatory Visit (HOSPITAL_BASED_OUTPATIENT_CLINIC_OR_DEPARTMENT_OTHER): Payer: BC Managed Care – PPO

## 2016-01-11 VITALS — BP 143/76 | HR 53 | Temp 98.6°F | Resp 16

## 2016-01-11 DIAGNOSIS — C712 Malignant neoplasm of temporal lobe: Secondary | ICD-10-CM

## 2016-01-11 DIAGNOSIS — C711 Malignant neoplasm of frontal lobe: Secondary | ICD-10-CM | POA: Diagnosis not present

## 2016-01-11 DIAGNOSIS — C719 Malignant neoplasm of brain, unspecified: Secondary | ICD-10-CM

## 2016-01-11 DIAGNOSIS — D496 Neoplasm of unspecified behavior of brain: Secondary | ICD-10-CM

## 2016-01-11 DIAGNOSIS — R600 Localized edema: Secondary | ICD-10-CM

## 2016-01-11 DIAGNOSIS — Z5112 Encounter for antineoplastic immunotherapy: Secondary | ICD-10-CM

## 2016-01-11 DIAGNOSIS — E876 Hypokalemia: Secondary | ICD-10-CM | POA: Diagnosis not present

## 2016-01-11 DIAGNOSIS — R21 Rash and other nonspecific skin eruption: Secondary | ICD-10-CM | POA: Diagnosis not present

## 2016-01-11 DIAGNOSIS — R233 Spontaneous ecchymoses: Secondary | ICD-10-CM

## 2016-01-11 DIAGNOSIS — R609 Edema, unspecified: Secondary | ICD-10-CM

## 2016-01-11 LAB — CBC WITH DIFFERENTIAL/PLATELET
BASO%: 0.2 % (ref 0.0–2.0)
BASOS ABS: 0 10*3/uL (ref 0.0–0.1)
EOS ABS: 0.1 10*3/uL (ref 0.0–0.5)
EOS%: 0.4 % (ref 0.0–7.0)
HCT: 35.8 % (ref 34.8–46.6)
HGB: 12.2 g/dL (ref 11.6–15.9)
LYMPH%: 9.3 % — AB (ref 14.0–49.7)
MCH: 33.1 pg (ref 25.1–34.0)
MCHC: 34.1 g/dL (ref 31.5–36.0)
MCV: 97 fL (ref 79.5–101.0)
MONO#: 0.9 10*3/uL (ref 0.1–0.9)
MONO%: 7.7 % (ref 0.0–14.0)
NEUT%: 82.4 % — AB (ref 38.4–76.8)
NEUTROS ABS: 9.3 10*3/uL — AB (ref 1.5–6.5)
PLATELETS: 191 10*3/uL (ref 145–400)
RBC: 3.69 10*6/uL — AB (ref 3.70–5.45)
RDW: 13.1 % (ref 11.2–14.5)
WBC: 11.3 10*3/uL — AB (ref 3.9–10.3)
lymph#: 1.1 10*3/uL (ref 0.9–3.3)

## 2016-01-11 LAB — COMPREHENSIVE METABOLIC PANEL
ALBUMIN: 2.9 g/dL — AB (ref 3.5–5.0)
ALK PHOS: 56 U/L (ref 40–150)
ALT: 23 U/L (ref 0–55)
ANION GAP: 9 meq/L (ref 3–11)
AST: 12 U/L (ref 5–34)
BILIRUBIN TOTAL: 0.38 mg/dL (ref 0.20–1.20)
BUN: 13.6 mg/dL (ref 7.0–26.0)
CO2: 24 meq/L (ref 22–29)
Calcium: 8.6 mg/dL (ref 8.4–10.4)
Chloride: 109 mEq/L (ref 98–109)
Creatinine: 0.8 mg/dL (ref 0.6–1.1)
EGFR: 77 mL/min/{1.73_m2} — AB (ref >90)
Glucose: 163 mg/dl — ABNORMAL HIGH (ref 70–140)
POTASSIUM: 3.3 meq/L — AB (ref 3.5–5.1)
Sodium: 142 mEq/L (ref 136–145)
TOTAL PROTEIN: 5.4 g/dL — AB (ref 6.4–8.3)

## 2016-01-11 MED ORDER — SODIUM CHLORIDE 0.9% FLUSH
10.0000 mL | INTRAVENOUS | Status: DC | PRN
  Administered 2016-01-11: 10 mL
  Filled 2016-01-11: qty 10

## 2016-01-11 MED ORDER — POTASSIUM CHLORIDE CRYS ER 20 MEQ PO TBCR
40.0000 meq | EXTENDED_RELEASE_TABLET | Freq: Once | ORAL | Status: AC
  Administered 2016-01-11: 40 meq via ORAL
  Filled 2016-01-11: qty 2

## 2016-01-11 MED ORDER — SODIUM CHLORIDE 0.9 % IV SOLN
10.0000 mg/kg | Freq: Once | INTRAVENOUS | Status: AC
  Administered 2016-01-11: 750 mg via INTRAVENOUS
  Filled 2016-01-11: qty 30

## 2016-01-11 MED ORDER — HEPARIN SOD (PORK) LOCK FLUSH 100 UNIT/ML IV SOLN
500.0000 [IU] | Freq: Once | INTRAVENOUS | Status: AC | PRN
  Administered 2016-01-11: 500 [IU]
  Filled 2016-01-11: qty 5

## 2016-01-11 MED ORDER — SODIUM CHLORIDE 0.9 % IJ SOLN
10.0000 mL | INTRAMUSCULAR | Status: DC | PRN
  Administered 2016-01-11: 10 mL via INTRAVENOUS
  Filled 2016-01-11: qty 10

## 2016-01-11 MED ORDER — SODIUM CHLORIDE 0.9 % IV SOLN
Freq: Once | INTRAVENOUS | Status: AC
  Administered 2016-01-11: 10:00:00 via INTRAVENOUS

## 2016-01-11 NOTE — Progress Notes (Signed)
Selena Lesser, NP notified of patient's purpuric rash on left forearm and bilateral lower extremity edema. Cyndee will see patient in the infusion room.

## 2016-01-11 NOTE — Patient Instructions (Signed)
[facility] Discharge Instructions for Patients Receiving Chemotherapy  Today you received the following chemotherapy agents Avastin To help prevent nausea and vomiting after your treatment, we encourage you to take your nausea medication as prescribed.   If you develop nausea and vomiting that is not controlled by your nausea medication, call the clinic.   BELOW ARE SYMPTOMS THAT SHOULD BE REPORTED IMMEDIATELY:  *FEVER GREATER THAN 100.5 F  *CHILLS WITH OR WITHOUT FEVER  NAUSEA AND VOMITING THAT IS NOT CONTROLLED WITH YOUR NAUSEA MEDICATION  *UNUSUAL SHORTNESS OF BREATH  *UNUSUAL BRUISING OR BLEEDING  TENDERNESS IN MOUTH AND THROAT WITH OR WITHOUT PRESENCE OF ULCERS  *URINARY PROBLEMS  *BOWEL PROBLEMS  UNUSUAL RASH Items with * indicate a potential emergency and should be followed up as soon as possible.  Feel free to call the clinic you have any questions or concerns. The clinic phone number is [phone].  Please show the CHEMO ALERT CARD at check-in to the Emergency Department and triage nurse.   

## 2016-01-11 NOTE — Patient Instructions (Signed)

## 2016-01-12 ENCOUNTER — Encounter: Payer: Self-pay | Admitting: Nurse Practitioner

## 2016-01-12 ENCOUNTER — Telehealth: Payer: Self-pay | Admitting: Hematology and Oncology

## 2016-01-12 ENCOUNTER — Telehealth: Payer: Self-pay

## 2016-01-12 DIAGNOSIS — R609 Edema, unspecified: Secondary | ICD-10-CM | POA: Insufficient documentation

## 2016-01-12 DIAGNOSIS — R600 Localized edema: Secondary | ICD-10-CM | POA: Insufficient documentation

## 2016-01-12 DIAGNOSIS — R233 Spontaneous ecchymoses: Secondary | ICD-10-CM | POA: Insufficient documentation

## 2016-01-12 NOTE — Assessment & Plan Note (Addendum)
Patient continues with her Avastin for treatment of her glioblastoma brain tumor on an every two-week basis.  Patient will receive the Avastin today as planned.  Patient will return on Thursday, 01/18/2016 for labs only.  Patient is scheduled to return on 01/25/2016 for follow-up visit here at the cancer center.

## 2016-01-12 NOTE — Assessment & Plan Note (Signed)
Patient reports some mild increased peripheral edema to her bilateral lower extremities for the past week or so.  Patient denies any chest pain, chest pressure, or shortness of breath.  Exam today reveals mild, +1 edema to her bilateral lower extremities; with no evidence of infection.  Patient was advised to elevate her legs above the level of for heart when ever she is resting.  She may also consider trying compression hose as well.  She should remain as active as possible.

## 2016-01-12 NOTE — Telephone Encounter (Signed)
Patient calling today regarding her potassium which was 3.3 yesterday.  She is confused about the medication and dosing.  Call transferred to Eulas Post, RN for clarification.

## 2016-01-12 NOTE — Progress Notes (Signed)
SYMPTOM MANAGEMENT CLINIC    Chief Complaint: Rash  HPI:  Michelle Duran 60 y.o. female diagnosed with glioblastoma brain tumor.  Currently undergoing Avastin therapy.   Patient reports development of a rash to her left forearm.  Within the past few days.  She also states she has several areas of bruising which she has bumped into things.  Recently as well.  She denies any pruritus or discomfort to this rash.  Labs obtained today reveal a WBC of 11.3, ANC 9.3, hemoglobin 2.2, platelet count 191.  Exam today reveals a scattered petechial rash to the left posterior forearm.  There is no evidence of infection.  Advised patient that patient's rash is actually a petechial rash; and could quite possibly be secondary to the Avastin therapy.  She is receiving.  Will continue to monitor closely.     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   12/14/2015 -  Chemotherapy Avastin 10 mg/kg every 2 weeks    Review of Systems  Cardiovascular: Positive for leg swelling.  Skin: Positive for rash. Negative for itching.  All other systems reviewed and are negative.   Past Medical History  Diagnosis Date  . Gallstone   . Hypertension   . Hypothyroidism   . Mitral valve prolapse   . Fibromyalgia   . Anxiety   . Vitamin D  deficiency   . Diabetes mellitus without complication ([place])   . Tobacco abuse   . Mass of adrenal gland ([place])   . Wears glasses   . Complication of anesthesia     Pt was shaking after having epidural  during c-section  . PVC's (premature ventricular contractions)     saw cardiologist Dr. Claiborne Billings > 3 years ago with echo and stress (entered 08/08/2015)  . Vertigo   . Numbness and tingling     fingers and toes  . Arthritis   . GERD (gastroesophageal reflux disease)   . History of hiatal hernia   . Nocturia   . Family history of adverse reaction to anesthesia     mother had PONV  . Panic attacks   . Neoplasm of brain ([place])   . Seizures [facility])     Past Surgical History  Procedure Laterality Date  . Cesarean section    . Multiple tooth extractions    . Esophagus stretched    . Craniotomy Right 08/09/2015    Procedure: Right Frontotemporal craniotomy for tumor resection with stereotactic navigation;  Surgeon: Kevan Ny Ditty, MD;  Location: [facility];  Service: Neurosurgery;  Laterality: Right;  Right Frontotemporal craniotomy for tumor resection with stereotactic navigation    has Hypothyroidism; PANIC DISORDER; MIGRAINE HEADACHE; Essential hypertension; Mitral valve disorder; CARDIAC ARRHYTHMIA; GERD; HIATAL HERNIA; IRRITABLE BOWEL SYNDROME; RECTAL FISSURE; FIBROMYALGIA; ANAL FISSURE, HX OF; Brain mass; Facial droop; Slurred speech; Glioblastoma multiforme of brain (  [place]); Depression; Anxiety state; Tobacco abuse; Diabetes mellitus, iatrogenic ([place]); Brain neoplasm ([place]); Dental abscess; DNR (do not resuscitate); Palliative care encounter; Seizures ([place]); Hypokalemia; Petechial rash; and Peripheral edema on her problem list.    is allergic to codeine; prednisone; and sulfonamide derivatives.    Medication List       This list is accurate as of: 01/11/16 11:59 PM.  Always use your most recent med list.               acetaminophen 650 MG CR tablet  Commonly known as:  TYLENOL   Take 1,300 mg by mouth every 8 (eight) hours as needed for pain. Reported on 11/09/2015     B-D ULTRAFINE III SHORT PEN 31G X 8 MM Misc  Generic drug:  Insulin Pen Needle     clonazePAM 0.5 MG disintegrating tablet  Commonly known as:  KLONOPIN  DISSOLVE 1 TABLET IN MOUTH EVERY 12 HOURS AS NEEDED SEIZURE     dexamethasone 4 MG tablet  Commonly known as:  DECADRON  Take 1 tablet (4 mg total) by mouth daily.     Insulin Glargine 100 UNIT/ML Solostar Pen  Commonly known as:  LANTUS  Inject 8 Units into the skin daily at 10 pm.     levETIRAcetam 500 MG tablet  Commonly known as:  KEPPRA  Take 3 tablets (1,500 mg total) by mouth 2 (two) times daily.     LORazepam 0.5 MG tablet  Commonly known as:  ATIVAN  Take 1 tablet every 4-6 hors prn nausea     pantoprazole 40 MG tablet  Commonly known as:  PROTONIX  Take 1 tablet (40 mg total) by mouth daily. Switch for any other PPI at similar dose and frequency     sertraline 100 MG tablet  Commonly known as:  ZOLOFT  Take 150 mg by mouth at bedtime. Reported on 11/09/2015     SYNTHROID 25 MCG tablet  Generic drug:  levothyroxine  Take 12.5 mcg by mouth daily before breakfast.         PHYSICAL EXAMINATION  Oncology Vitals 01/11/2016 12/28/2015  Height - -  Weight - -  Weight (lbs) - -  BMI (kg/m2) - -  Temp 98.6 -  Pulse 53 52  Resp 16 -  SpO2 98 -  BSA (m2) - -   BP Readings from Last 2 Encounters:  01/11/16 143/76  12/28/15 126/61    Physical Exam  Constitutional: She is oriented to person, place, and time and well-developed, well-nourished, and in no distress.  HENT:  Head: Normocephalic and atraumatic.  Eyes: Conjunctivae and EOM are normal. Pupils are equal, round, and reactive to light. Right eye exhibits no discharge. Left eye exhibits no discharge. No scleral icterus.  Neck: Normal range of motion.  Pulmonary/Chest: Effort normal. No respiratory distress.  Musculoskeletal: Normal range of motion. She exhibits  edema. She exhibits no tenderness.  Patient has +1 edema to her bilateral lower extremities.  There is no calf tenderness.  Neurological: She is alert and oriented to person, place, and time.  Skin: Skin is warm and dry. No erythema. No pallor.  Patient has what appears to be a petechial rash to the left posterior forearm only.  Psychiatric: Affect normal.  Nursing note and vitals reviewed.   LABORATORY DATA:. Appointment on 01/11/2016  Component Date Value Ref Range Status  . WBC 01/11/2016 11.3* 3.9 - 10.3 10e3/uL Final  . NEUT# 01/11/2016 9.3* 1.5 - 6.5 10e3/uL Final  . HGB  01/11/2016 12.2  11.6 - 15.9 g/dL Final  . HCT 01/11/2016 35.8  34.8 - 46.6 % Final  . Platelets 01/11/2016 191  145 - 400 10e3/uL Final  . MCV 01/11/2016 97.0  79.5 - 101.0 fL Final  . MCH 01/11/2016 33.1  25.1 - 34.0 pg Final  . MCHC 01/11/2016 34.1  31.5 - 36.0 g/dL Final  . RBC 01/11/2016 3.69* 3.70 - 5.45 10e6/uL Final  . RDW 01/11/2016 13.1  11.2 - 14.5 % Final  . lymph# 01/11/2016 1.1  0.9 - 3.3 10e3/uL Final  . MONO# 01/11/2016 0.9  0.1 - 0.9 10e3/uL Final  . Eosinophils Absolute 01/11/2016 0.1  0.0 - 0.5 10e3/uL Final  . Basophils Absolute 01/11/2016 0.0  0.0 - 0.1 10e3/uL Final  . NEUT% 01/11/2016 82.4* 38.4 - 76.8 % Final  . LYMPH% 01/11/2016 9.3* 14.0 - 49.7 % Final  . MONO% 01/11/2016 7.7  0.0 - 14.0 % Final  . EOS% 01/11/2016 0.4  0.0 - 7.0 % Final  . BASO% 01/11/2016 0.2  0.0 - 2.0 % Final  . Sodium 01/11/2016 142  136 - 145 mEq/L Final  . Potassium 01/11/2016 3.3* 3.5 - 5.1 mEq/L Final  . Chloride 01/11/2016 109  98 - 109 mEq/L Final  . CO2 01/11/2016 24  22 - 29 mEq/L Final  . Glucose 01/11/2016 163* 70 - 140 mg/dl Final   Glucose reference range is for nonfasting patients. Fasting glucose reference range is 70- 100.  Marland Kitchen BUN 01/11/2016 13.6  7.0 - 26.0 mg/dL Final  . Creatinine 01/11/2016 0.8  0.6 - 1.1 mg/dL Final  . Total Bilirubin 01/11/2016 0.38  0.20 - 1.20 mg/dL Final  . Alkaline  Phosphatase 01/11/2016 56  40 - 150 U/L Final  . AST 01/11/2016 12  5 - 34 U/L Final  . ALT 01/11/2016 23  0 - 55 U/L Final  . Total Protein 01/11/2016 5.4* 6.4 - 8.3 g/dL Final  . Albumin 01/11/2016 2.9* 3.5 - 5.0 g/dL Final  . Calcium 01/11/2016 8.6  8.4 - 10.4 mg/dL Final  . Anion Gap 01/11/2016 9  3 - 11 mEq/L Final  . EGFR 01/11/2016 77* >90 ml/min/1.73 m2 Final   eGFR is calculated using the CKD-EPI Creatinine Equation (2009)   Left forearm:      RADIOGRAPHIC STUDIES: No results found.  ASSESSMENT/PLAN:    Petechial rash Patient reports development of a rash to her left forearm.  Within the past few days.  She also states she has several areas of bruising which she has bumped into things.  Recently as well.  She denies any pruritus or discomfort to this rash.  Labs obtained today reveal a WBC of 11.3, ANC 9.3, hemoglobin 2.2, platelet count 191.  Exam today reveals a scattered petechial rash to the left posterior forearm.  There is no evidence of infection.  Advised patient that patient's rash is actually a petechial rash; and could quite possibly be secondary to the Avastin therapy.  She is receiving.  Will continue to monitor closely.    Peripheral edema Patient reports some mild increased peripheral edema to her bilateral lower extremities for the past week or so.  Patient denies any chest pain, chest pressure, or shortness of breath.  Exam today reveals mild, +1 edema to her bilateral lower extremities; with no evidence of infection.  Patient was advised to elevate her legs above the level of for heart when ever she is resting.  She may also consider trying compression hose as well.  She should remain as active as possible.  Hypokalemia Potassium is decreased to 3.3 today.  Patient states that she has potassium 10 mEq tablets at home; but has not been taking them.  Will give patient potassium 40 mEq orally while at the [place] today; and instructed patient to  start taking the potassium 10 mEq tablets on a daily basis until recheck.  Glioblastoma multiforme of brain [facility]) Patient continues with her Avastin for treatment of her glioblastoma brain tumor on an every two-week basis.  Patient will receive the Avastin today as planned.  Patient will return on Thursday, 01/18/2016 for labs only.  Patient is scheduled to return on 01/25/2016 for follow-up visit here at the cancer center.   Patient stated understanding of all instructions; and was in agreement with this plan of care. The patient knows to call the clinic with any problems, questions or concerns.   Total time spent with patient was 25 minutes;  with greater than 75 percent of that time spent in face to face counseling regarding patient's symptoms,  and coordination of care and follow up.  Disclaimer:This dictation was prepared with Dragon/digital dictation along with [employer]. Any transcriptional errors that result from this process are unintentional.  Drue Second, NP 01/12/2016

## 2016-01-12 NOTE — Telephone Encounter (Signed)
Pt called triage questioning how much K+ to take in regards to yesterday's visit with Selena Lesser, NP in infusion. Pt was informed by Selena Lesser, NP to take 10 mEq daily.  Pt informed Cyndee she had some K+ already at home from previous prescription and wished to take those.  I spoke with Cyndee today who confirmed plan of care was for pt to take 10 mEq daily and to follow up in 1 week for repeat labs.  Pt verbalized understanding and without further questions or concerns at time of call.

## 2016-01-12 NOTE — Assessment & Plan Note (Signed)
Patient reports development of a rash to her left forearm.  Within the past few days.  She also states she has several areas of bruising which she has bumped into things.  Recently as well.  She denies any pruritus or discomfort to this rash.  Labs obtained today reveal a WBC of 11.3, ANC 9.3, hemoglobin 2.2, platelet count 191.  Exam today reveals a scattered petechial rash to the left posterior forearm.  There is no evidence of infection.  Advised patient that patient's rash is actually a petechial rash; and could quite possibly be secondary to the Avastin therapy.  She is receiving.  Will continue to monitor closely.

## 2016-01-12 NOTE — Assessment & Plan Note (Signed)
Potassium is decreased to 3.3 today.  Patient states that she has potassium 10 mEq tablets at home; but has not been taking them.  Will give patient potassium 40 mEq orally while at the [place] today; and instructed patient to start taking the potassium 10 mEq tablets on a daily basis until recheck.

## 2016-01-12 NOTE — Telephone Encounter (Signed)
cld & spoke to pt son and adv of time & date of appt for 5/18-son stated he will give pt message

## 2016-01-16 ENCOUNTER — Telehealth: Payer: Self-pay

## 2016-01-16 ENCOUNTER — Other Ambulatory Visit: Payer: Self-pay

## 2016-01-16 NOTE — Telephone Encounter (Signed)
Received VM from pt questioning whether we received disability forms for this month.  Upon looking into this, I am unable to find any forms that were received etc.  Pt states d/t her disease process she doesn't remember if she turned these in or not and "probably forgot."  Pt states she will send this in to [place] as soon as possible as she has missed the deadline to receive her disability check this month.  I informed pt to send [place] anything we could fill out or do to help her obtain this and to help her in the future.  Pt verbalized understanding.  Pt also questioning appt time for lab work on 01/18/16.  I confirmed appointment with pt and pt without further questions or concerns at time of call.

## 2016-01-18 ENCOUNTER — Other Ambulatory Visit (HOSPITAL_BASED_OUTPATIENT_CLINIC_OR_DEPARTMENT_OTHER): Payer: BC Managed Care – PPO

## 2016-01-18 ENCOUNTER — Telehealth: Payer: Self-pay

## 2016-01-18 DIAGNOSIS — C711 Malignant neoplasm of frontal lobe: Secondary | ICD-10-CM

## 2016-01-18 DIAGNOSIS — C712 Malignant neoplasm of temporal lobe: Secondary | ICD-10-CM | POA: Diagnosis not present

## 2016-01-18 DIAGNOSIS — C719 Malignant neoplasm of brain, unspecified: Secondary | ICD-10-CM

## 2016-01-18 LAB — COMPREHENSIVE METABOLIC PANEL
ALT: 20 U/L (ref 0–55)
ANION GAP: 8 meq/L (ref 3–11)
AST: 10 U/L (ref 5–34)
Albumin: 2.9 g/dL — ABNORMAL LOW (ref 3.5–5.0)
Alkaline Phosphatase: 61 U/L (ref 40–150)
BUN: 17.8 mg/dL (ref 7.0–26.0)
CHLORIDE: 110 meq/L — AB (ref 98–109)
CO2: 24 meq/L (ref 22–29)
Calcium: 9.2 mg/dL (ref 8.4–10.4)
Creatinine: 0.8 mg/dL (ref 0.6–1.1)
EGFR: 77 mL/min/{1.73_m2} — AB (ref >90)
Glucose: 206 mg/dl — ABNORMAL HIGH (ref 70–140)
Potassium: 3.5 mEq/L (ref 3.5–5.1)
Sodium: 142 mEq/L (ref 136–145)
Total Bilirubin: 0.3 mg/dL (ref 0.20–1.20)
Total Protein: 5.8 g/dL — ABNORMAL LOW (ref 6.4–8.3)

## 2016-01-18 LAB — CBC WITH DIFFERENTIAL/PLATELET
BASO%: 0.1 % (ref 0.0–2.0)
Basophils Absolute: 0 10*3/uL (ref 0.0–0.1)
EOS ABS: 0.1 10*3/uL (ref 0.0–0.5)
EOS%: 0.5 % (ref 0.0–7.0)
HEMATOCRIT: 38.6 % (ref 34.8–46.6)
HGB: 13.4 g/dL (ref 11.6–15.9)
LYMPH%: 8 % — ABNORMAL LOW (ref 14.0–49.7)
MCH: 33.4 pg (ref 25.1–34.0)
MCHC: 34.7 g/dL (ref 31.5–36.0)
MCV: 96.3 fL (ref 79.5–101.0)
MONO#: 0.8 10*3/uL (ref 0.1–0.9)
MONO%: 6 % (ref 0.0–14.0)
NEUT#: 11.3 10*3/uL — ABNORMAL HIGH (ref 1.5–6.5)
NEUT%: 85.4 % — ABNORMAL HIGH (ref 38.4–76.8)
Platelets: 233 10*3/uL (ref 145–400)
RBC: 4.01 10*6/uL (ref 3.70–5.45)
RDW: 12.8 % (ref 11.2–14.5)
WBC: 13.3 10*3/uL — ABNORMAL HIGH (ref 3.9–10.3)
lymph#: 1.1 10*3/uL (ref 0.9–3.3)

## 2016-01-18 NOTE — Telephone Encounter (Signed)
Called pt back in regards to VM pt left requesting clarification on appointments.  Informed pt she was scheduled today for labs at 9:30am and brain MRI with Dr. Cyndy Freeze this afternoon at 1:00pm.  I instructed pt since she was having this MRI done by her neurosurgeon, Dr. Lindi Adie stated we could cancel the brain MRI he ordered for the beginning of June.  Pt verbalized understanding of this.  Reviewed next weeks appointments with pt to help alleviate some of her confusion.  Pt verbalized understanding of these appointments and without further questions or concerns at time of call.

## 2016-01-19 ENCOUNTER — Telehealth: Payer: Self-pay | Admitting: *Deleted

## 2016-01-19 NOTE — Telephone Encounter (Signed)
TC to pt to report lab results yesterday. Potassium was 3.5. Pt instructed to continue taking Potassium 10 meq daily. Pt repeated back and verbalized an understanding. Pt confirms appt with Dr. Lindi Adie 5/25 labs prior

## 2016-01-21 ENCOUNTER — Other Ambulatory Visit: Payer: Self-pay | Admitting: Radiation Oncology

## 2016-01-23 ENCOUNTER — Telehealth: Payer: Self-pay | Admitting: Radiation Oncology

## 2016-01-23 NOTE — Telephone Encounter (Signed)
Sam, Can you follow up with Michelle Duran? We weren't the ones who started this. I thought she was getting in with a neurologist for management of this.

## 2016-01-23 NOTE — Telephone Encounter (Signed)
Phoned patient to inquire if she continues to follow up with Dr. Cyndy Freeze. Patient confirms she saw Dr. Cyndy Freeze today. Additionally, patient reports Dr. Cyndy Freeze plans to refill her Keppra and protonix for her today. Patient denies any other needs at this time.

## 2016-01-25 ENCOUNTER — Other Ambulatory Visit: Payer: Self-pay | Admitting: *Deleted

## 2016-01-25 ENCOUNTER — Ambulatory Visit (HOSPITAL_BASED_OUTPATIENT_CLINIC_OR_DEPARTMENT_OTHER): Payer: BC Managed Care – PPO

## 2016-01-25 ENCOUNTER — Ambulatory Visit: Payer: BC Managed Care – PPO

## 2016-01-25 ENCOUNTER — Other Ambulatory Visit (HOSPITAL_BASED_OUTPATIENT_CLINIC_OR_DEPARTMENT_OTHER): Payer: BC Managed Care – PPO

## 2016-01-25 ENCOUNTER — Ambulatory Visit (HOSPITAL_BASED_OUTPATIENT_CLINIC_OR_DEPARTMENT_OTHER): Payer: BC Managed Care – PPO | Admitting: Hematology and Oncology

## 2016-01-25 ENCOUNTER — Encounter: Payer: Self-pay | Admitting: Hematology and Oncology

## 2016-01-25 ENCOUNTER — Telehealth: Payer: Self-pay | Admitting: Hematology and Oncology

## 2016-01-25 VITALS — BP 123/69 | HR 61 | Temp 98.2°F | Resp 17 | Wt 176.9 lb

## 2016-01-25 DIAGNOSIS — C719 Malignant neoplasm of brain, unspecified: Secondary | ICD-10-CM

## 2016-01-25 DIAGNOSIS — C711 Malignant neoplasm of frontal lobe: Secondary | ICD-10-CM

## 2016-01-25 DIAGNOSIS — R21 Rash and other nonspecific skin eruption: Secondary | ICD-10-CM

## 2016-01-25 DIAGNOSIS — D496 Neoplasm of unspecified behavior of brain: Secondary | ICD-10-CM

## 2016-01-25 DIAGNOSIS — Z5112 Encounter for antineoplastic immunotherapy: Secondary | ICD-10-CM

## 2016-01-25 LAB — COMPREHENSIVE METABOLIC PANEL
ALT: 21 U/L (ref 0–55)
AST: 10 U/L (ref 5–34)
Albumin: 3.2 g/dL — ABNORMAL LOW (ref 3.5–5.0)
Alkaline Phosphatase: 56 U/L (ref 40–150)
Anion Gap: 12 mEq/L — ABNORMAL HIGH (ref 3–11)
BUN: 17.1 mg/dL (ref 7.0–26.0)
CHLORIDE: 109 meq/L (ref 98–109)
CO2: 22 meq/L (ref 22–29)
CREATININE: 1 mg/dL (ref 0.6–1.1)
Calcium: 9.2 mg/dL (ref 8.4–10.4)
EGFR: 65 mL/min/{1.73_m2} — ABNORMAL LOW (ref >90)
Glucose: 178 mg/dl — ABNORMAL HIGH (ref 70–140)
Potassium: 3.5 mEq/L (ref 3.5–5.1)
SODIUM: 143 meq/L (ref 136–145)
Total Bilirubin: 0.41 mg/dL (ref 0.20–1.20)
Total Protein: 6 g/dL — ABNORMAL LOW (ref 6.4–8.3)

## 2016-01-25 LAB — UA PROTEIN, DIPSTICK - CHCC: Protein, ur: <30 mg/dL

## 2016-01-25 LAB — CBC WITH DIFFERENTIAL/PLATELET
BASO%: 0.3 % (ref 0.0–2.0)
Basophils Absolute: 0 10*3/uL (ref 0.0–0.1)
EOS%: 0.4 % (ref 0.0–7.0)
Eosinophils Absolute: 0 10*3/uL (ref 0.0–0.5)
HCT: 41 % (ref 34.8–46.6)
HGB: 13.7 g/dL (ref 11.6–15.9)
LYMPH#: 2.8 10*3/uL (ref 0.9–3.3)
LYMPH%: 29.8 % (ref 14.0–49.7)
MCH: 32.9 pg (ref 25.1–34.0)
MCHC: 33.5 g/dL (ref 31.5–36.0)
MCV: 98.2 fL (ref 79.5–101.0)
MONO#: 0.5 10*3/uL (ref 0.1–0.9)
MONO%: 5.6 % (ref 0.0–14.0)
NEUT#: 6.1 10*3/uL (ref 1.5–6.5)
NEUT%: 63.9 % (ref 38.4–76.8)
Platelets: 264 10*3/uL (ref 145–400)
RBC: 4.18 10*6/uL (ref 3.70–5.45)
RDW: 13.1 % (ref 11.2–14.5)
WBC: 9.5 10*3/uL (ref 3.9–10.3)

## 2016-01-25 MED ORDER — SODIUM CHLORIDE 0.9 % IV SOLN
10.0000 mg/kg | Freq: Once | INTRAVENOUS | Status: AC
  Administered 2016-01-25: 750 mg via INTRAVENOUS
  Filled 2016-01-25: qty 30

## 2016-01-25 MED ORDER — SODIUM CHLORIDE 0.9% FLUSH
10.0000 mL | INTRAVENOUS | Status: DC | PRN
  Administered 2016-01-25: 10 mL
  Filled 2016-01-25: qty 10

## 2016-01-25 MED ORDER — HEPARIN SOD (PORK) LOCK FLUSH 100 UNIT/ML IV SOLN
500.0000 [IU] | Freq: Once | INTRAVENOUS | Status: AC | PRN
  Administered 2016-01-25: 500 [IU]
  Filled 2016-01-25: qty 5

## 2016-01-25 MED ORDER — SODIUM CHLORIDE 0.9 % IV SOLN
Freq: Once | INTRAVENOUS | Status: AC
  Administered 2016-01-25: 10:00:00 via INTRAVENOUS

## 2016-01-25 MED ORDER — SODIUM CHLORIDE 0.9 % IJ SOLN
10.0000 mL | INTRAMUSCULAR | Status: DC | PRN
  Administered 2016-01-25: 10 mL via INTRAVENOUS
  Filled 2016-01-25: qty 10

## 2016-01-25 NOTE — Progress Notes (Signed)
Patient Care Team: Carol Ada, MD as PCP - General (Family Medicine)  DIAGNOSIS: GBM  SUMMARY OF ONCOLOGIC HISTORY:   Glioblastoma multiforme of brain ([place])   07/26/2015 Imaging Brain MRI: Heterogeneous enhancing right frontal lobe mass 4.7 x 3.6 x 3.3 cm with vasogenic edema, heterogeneous enhancing mid right temporal lobe mass 4.1 x 3.1 x 2.2 cm with edema and right-to-left shift and mass effect   08/09/2015 Initial Diagnosis Tumor resection right frontal (2.3 cm) and temporal lobes (2.4 cm): Glioblastoma WHO grade 4 (IDH1/IDH2 Mutations Not detected)   08/29/2015 - 10/11/2015 Radiation Therapy Concurrent radiation with Temodar   10/23/2015 Imaging Brain MRI: Enhancement right temporal lobe cavity 4.3 x 3.7 cm previously 3.3 cm, second right frontal lobe nodule 3 x 3.3 cm, previously 2.9 cm, right mesial frontal lobe nodule 11 mm.   12/14/2015 -  Chemotherapy Avastin 10 mg/kg every 2 weeks    CHIEF COMPLIANT: Follow-up on Avastin  INTERVAL HISTORY: Michelle Duran is a 60 year old with above-mentioned history of glioblastoma of the brain who is currently on second line treatment with Avastin. She is tolerating Avastin fairly well except for a petechial rash on her forearm related to Avastin therapy. She has not had any other bleeding problems. Her blood pressures been under good control. She has not been ill or sick from the Avastin. She's been eating well. Occasionally her family reports that her speech may be garbled. Denies any other new neurological changes. She is currently on dexamethasone half a tablet daily.  REVIEW OF SYSTEMS:   Constitutional: Denies fevers, chills or abnormal weight loss Eyes: Denies blurriness of vision Ears, nose, mouth, throat, and face: Denies mucositis or sore throat Respiratory: Denies cough, dyspnea or wheezes Cardiovascular: Denies palpitation, chest discomfort Gastrointestinal:  Denies nausea, heartburn or change in bowel habits Skin: Denies abnormal  skin rashes Lymphatics: Denies new lymphadenopathy or easy bruising Neurological: Speech issues Behavioral/Psych: Mood is stable, no new changes  Extremities: No lower extremity edema  All other systems were reviewed with the patient and are negative.  I have reviewed the past medical history, past surgical history, social history and family history with the patient and they are unchanged from previous note.  ALLERGIES:  is allergic to codeine; prednisone; and sulfonamide derivatives.  MEDICATIONS:  Current Outpatient Prescriptions  Medication Sig Dispense Refill  . acetaminophen (TYLENOL) 650 MG CR tablet Take 1,300 mg by mouth every 8 (eight) hours as needed for pain. Reported on 11/09/2015    . B-D ULTRAFINE III SHORT PEN 31G X 8 MM MISC     . clonazePAM (KLONOPIN) 0.5 MG disintegrating tablet DISSOLVE 1 TABLET IN MOUTH EVERY 12 HOURS AS NEEDED SEIZURE  0  . dexamethasone (DECADRON) 4 MG tablet Take 1 tablet (4 mg total) by mouth daily. 30 tablet 1  . Insulin Glargine (LANTUS) 100 UNIT/ML Solostar Pen Inject 8 Units into the skin daily at 10 pm. 15 mL 11  . levETIRAcetam (KEPPRA) 500 MG tablet TAKE 3 TABLETS BY MOUTH TWICE A DAY 180 tablet 0  . LORazepam (ATIVAN) 0.5 MG tablet Take 1 tablet every 4-6 hors prn nausea 30 tablet 0  . pantoprazole (PROTONIX) 40 MG tablet Take 1 tablet (40 mg total) by mouth daily. Switch for any other PPI at similar dose and frequency 30 tablet 3  . sertraline (ZOLOFT) 100 MG tablet Take 150 mg by mouth at bedtime. Reported on 11/09/2015    . SYNTHROID 25 MCG tablet Take 12.5 mcg by mouth daily before  breakfast.   0   No current facility-administered medications for this visit.   Facility-Administered Medications Ordered in Other Visits  Medication Dose Route Frequency Provider Last Rate Last Dose  . SONAFINE emulsion 1 application  1 application Topical BID Tyler Pita, MD   1 application at 54/00/86 1517    PHYSICAL EXAMINATION: ECOG PERFORMANCE  STATUS: 1 - Symptomatic but completely ambulatory  Filed Vitals:   01/25/16 0851  BP: 123/69  Pulse: 61  Temp: 98.2 F (36.8 C)  Resp: 17   Filed Weights   01/25/16 0851  Weight: 176 lb 14.4 oz (80.241 kg)    GENERAL:alert, no distress and comfortable SKIN: skin color, texture, turgor are normal, no rashes or significant lesions EYES: normal, Conjunctiva are pink and non-injected, sclera clear OROPHARYNX:no exudate, no erythema and lips, buccal mucosa, and tongue normal  NECK: supple, thyroid normal size, non-tender, without nodularity LYMPH:  no palpable lymphadenopathy in the cervical, axillary or inguinal LUNGS: clear to auscultation and percussion with normal breathing effort HEART: regular rate & rhythm and no murmurs and no lower extremity edema ABDOMEN:abdomen soft, non-tender and normal bowel sounds MUSCULOSKELETAL:no cyanosis of digits and no clubbing  NEURO: alert & oriented x 3 with fluent speech, no focal motor/sensory deficits EXTREMITIES: No lower extremity edema  LABORATORY DATA:  I have reviewed the data as listed   Chemistry      Component Value Date/Time   NA 142 01/18/2016 0931   NA 141 11/29/2015 1256   K 3.5 01/18/2016 0931   K 4.2 11/29/2015 1256   CL 108 11/29/2015 1256   CO2 24 01/18/2016 0931   CO2 25 11/29/2015 1256   BUN 17.8 01/18/2016 0931   BUN 23* 11/29/2015 1256   CREATININE 0.8 01/18/2016 0931   CREATININE 0.73 11/29/2015 1256      Component Value Date/Time   CALCIUM 9.2 01/18/2016 0931   CALCIUM 8.8* 11/29/2015 1256   ALKPHOS 61 01/18/2016 0931   ALKPHOS 36* 10/31/2015 1756   AST 10 01/18/2016 0931   AST 17 10/31/2015 1756   ALT 20 01/18/2016 0931   ALT 23 10/31/2015 1756   BILITOT 0.30 01/18/2016 0931   BILITOT 0.5 10/31/2015 1756       Lab Results  Component Value Date   WBC 9.5 01/25/2016   HGB 13.7 01/25/2016   HCT 41.0 01/25/2016   MCV 98.2 01/25/2016   PLT 264 01/25/2016   NEUTROABS 6.1 01/25/2016      ASSESSMENT & PLAN:  Glioblastoma multiforme of brain ([place]) Brain MRI 07/24/2015: Heterogeneous enhancing right frontal lobe mass 4.7 x 3.6 x 3.3 cm with vasogenic edema, heterogeneous enhancing mid right temporal lobe mass 4.1 x 3.1 x 2.2 cm with edema and right-to-left shift and mass effect. 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 and MGMT Methylation: Not Detected) Suggests Poorer response to chemo-XRT and Poor prognosis ----------------------------------------------------------------------------------------------------------------------------------------------------------- Treatment summary:Concurrent chemoradiation with daily temozolomide at 75 mg/m (180 mg dose) started 08/29/2015 completed 10/11/2015 MRI brain 10/23/2015: Enhancement right temporal lobe cavity 4.3 x 3.7 cm previously 3.3 cm, second right frontal lobe nodule 3 x 3.3 cm, previously 2.9 cm, right mesial frontal lobe nodule 11 mm.  Treatment plan: Delay in starting treatment because patient missed appointments for port placement  Avastin 10 mg/kg every 2 weeks starting at 12/14/2015, today is cycle 4  Avastin toxicities:  1. Petechial rash 2. Bruising on the arms and thighs  Brain MRI was done by Dr. Cyndy Freeze I do not have a copy of this report. She was informed that the scan showed improvement.  Return to clinic in 4 weeks for follow-up every 2 weeks for Avastin;     No orders of the defined types were placed in this encounter.   The patient has a good understanding of the overall plan. she agrees with it. she will call with any problems that may develop before the next visit here.   Rulon Eisenmenger, MD 01/25/2016

## 2016-01-25 NOTE — Patient Instructions (Signed)

## 2016-01-25 NOTE — Assessment & Plan Note (Signed)
Brain MRI 07/24/2015: Heterogeneous enhancing right frontal lobe mass 4.7 x 3.6 x 3.3 cm with vasogenic edema, heterogeneous enhancing mid right temporal lobe mass 4.1 x 3.1 x 2.2 cm with edema and right-to-left shift and mass effect. 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 and MGMT Methylation: Not Detected) Suggests Poorer response to chemo-XRT and Poor prognosis ----------------------------------------------------------------------------------------------------------------------------------------------------------- Treatment summary:Concurrent chemoradiation with daily temozolomide at 75 mg/m (180 mg dose) started 08/29/2015 completed 10/11/2015 MRI brain 10/23/2015: Enhancement right temporal lobe cavity 4.3 x 3.7 cm previously 3.3 cm, second right frontal lobe nodule 3 x 3.3 cm, previously 2.9 cm, right mesial frontal lobe nodule 11 mm.  Treatment plan: Delay in starting treatment because patient missed appointments for port placement  Avastin 10 mg/kg every 2 weeks starting at 12/14/2015, today is cycle 4  Avastin toxicities:  1. Petechial rash 2. Bruising on the arms and thighs   Return to clinic in 4 weeks for follow-up every 2 weeks for Avastin;  Brain MRI will be done after 4 cycles of Avastin in the first week of June

## 2016-01-25 NOTE — Telephone Encounter (Signed)
appt made and avs printed °

## 2016-01-25 NOTE — Patient Instructions (Signed)
[facility] Discharge Instructions for Patients Receiving Chemotherapy  Today you received the following chemotherapy agents Avastin To help prevent nausea and vomiting after your treatment, we encourage you to take your nausea medication as prescribed.   If you develop nausea and vomiting that is not controlled by your nausea medication, call the clinic.   BELOW ARE SYMPTOMS THAT SHOULD BE REPORTED IMMEDIATELY:  *FEVER GREATER THAN 100.5 F  *CHILLS WITH OR WITHOUT FEVER  NAUSEA AND VOMITING THAT IS NOT CONTROLLED WITH YOUR NAUSEA MEDICATION  *UNUSUAL SHORTNESS OF BREATH  *UNUSUAL BRUISING OR BLEEDING  TENDERNESS IN MOUTH AND THROAT WITH OR WITHOUT PRESENCE OF ULCERS  *URINARY PROBLEMS  *BOWEL PROBLEMS  UNUSUAL RASH Items with * indicate a potential emergency and should be followed up as soon as possible.  Feel free to call the clinic you have any questions or concerns. The clinic phone number is [phone].  Please show the CHEMO ALERT CARD at check-in to the Emergency Department and triage nurse.   

## 2016-01-30 ENCOUNTER — Other Ambulatory Visit: Payer: Self-pay | Admitting: Hematology and Oncology

## 2016-01-31 ENCOUNTER — Telehealth: Payer: Self-pay

## 2016-02-01 ENCOUNTER — Telehealth: Payer: Self-pay

## 2016-02-01 NOTE — Telephone Encounter (Signed)
Called and spoke with Ander Purpura - SW at [facility] - shared with her the needs of patient regarding utility bills and seeking help to get disability status.  Lauren stated that she will have someone reach out to patient to assist with these needs.    Called patient to inform of the above.

## 2016-02-01 NOTE — Telephone Encounter (Signed)
Spoke with patient over the telephone, patients states she is in need of assistance with utility bills and seeking disability status.  Currently is not working with a SW from the [employer]  Patient expressed diminised memory, speaking problems and difficulty writing.  Lives with 59 y.o. Mother, she is seeking advocate for her needs. Shared with patient that I will contact the [place] to express her needs and call her back.

## 2016-02-01 NOTE — Progress Notes (Signed)
This encounter was created in error - please disregard.

## 2016-02-01 NOTE — Telephone Encounter (Signed)
Called and spoke with Ander Purpura, SW with [facility], reviewed patients needs with her (utility bills and disability status), she will have a Education officer, museum reach out to her to assist with these needs.  Shared with Ander Purpura that patient is having difficulty with memory, speech and writing.  Called patient to inform her that a SW will be calling her to assist with her needs.

## 2016-02-02 ENCOUNTER — Encounter: Payer: Self-pay | Admitting: *Deleted

## 2016-02-02 NOTE — Progress Notes (Signed)
[place] Work  Clinical Social Work was referred by Advertising account planner for assessment of psychosocial needs.  Clinical Social Worker attempted to contact patient at home to offer support and assess for needs.  CSW left detailed message explaining role of CSW and encouraged pt to return call. CSW will meet with pt at treatment on 02/08/16 and continue to assist.     Loren Racer, [place] Worker [place]  [facility] Phone: [phone] Fax: [phone]

## 2016-02-05 NOTE — Congregational Nurse Program (Signed)
Congregational Nurse Program Note  Date of Encounter: 02/01/2016  Past Medical History: Past Medical History  Diagnosis Date  . Gallstone   . Hypertension   . Hypothyroidism   . Mitral valve prolapse   . Fibromyalgia   . Anxiety   . Vitamin D deficiency   . Diabetes mellitus without complication ([place])   . Tobacco abuse   . Mass of adrenal gland ([place])   . Wears glasses   . Complication of anesthesia     Pt was shaking after having epidural  during c-section  . PVC's (premature ventricular contractions)     saw cardiologist Dr. Claiborne Billings > 3 years ago with echo and stress (entered 08/08/2015)  . Vertigo   . Numbness and tingling     fingers and toes  . Arthritis   . GERD (gastroesophageal reflux disease)   . History of hiatal hernia   . Nocturia   . Family history of adverse reaction to anesthesia     mother had PONV  . Panic attacks   . Neoplasm of brain ([place])   . Seizures ([place])     Encounter Details:

## 2016-02-05 NOTE — Congregational Nurse Program (Signed)
Congregational Nurse Program Note  Date of Encounter: 02/01/2016  Past Medical History: Past Medical History  Diagnosis Date  . Gallstone   . Hypertension   . Hypothyroidism   . Mitral valve prolapse   . Fibromyalgia   . Anxiety   . Vitamin D deficiency   . Diabetes mellitus without complication ([place])   . Tobacco abuse   . Mass of adrenal gland ([place])   . Wears glasses   . Complication of anesthesia     Pt was shaking after having epidural  during c-section  . PVC's (premature ventricular contractions)     saw cardiologist Dr. Claiborne Billings > 3 years ago with echo and stress (entered 08/08/2015)  . Vertigo   . Numbness and tingling     fingers and toes  . Arthritis   . GERD (gastroesophageal reflux disease)   . History of hiatal hernia   . Nocturia   . Family history of adverse reaction to anesthesia     mother had PONV  . Panic attacks   . Neoplasm of brain ([place])   . Seizures ([place])     Encounter Details:

## 2016-02-08 ENCOUNTER — Other Ambulatory Visit: Payer: Self-pay | Admitting: Hematology and Oncology

## 2016-02-08 ENCOUNTER — Emergency Department (HOSPITAL_COMMUNITY)
Admission: EM | Admit: 2016-02-08 | Discharge: 2016-02-08 | Disposition: A | Discharge status: Home / Self Care | Payer: BC Managed Care – PPO | Attending: Emergency Medicine | Admitting: Emergency Medicine

## 2016-02-08 ENCOUNTER — Other Ambulatory Visit: Payer: Self-pay

## 2016-02-08 ENCOUNTER — Encounter (HOSPITAL_COMMUNITY): Payer: Self-pay | Admitting: Emergency Medicine

## 2016-02-08 ENCOUNTER — Emergency Department (HOSPITAL_COMMUNITY): Payer: BC Managed Care – PPO

## 2016-02-08 ENCOUNTER — Ambulatory Visit (HOSPITAL_BASED_OUTPATIENT_CLINIC_OR_DEPARTMENT_OTHER): Payer: BC Managed Care – PPO

## 2016-02-08 VITALS — BP 122/78 | HR 55 | Temp 98.0°F

## 2016-02-08 DIAGNOSIS — M1990 Unspecified osteoarthritis, unspecified site: Secondary | ICD-10-CM | POA: Insufficient documentation

## 2016-02-08 DIAGNOSIS — R1011 Right upper quadrant pain: Secondary | ICD-10-CM | POA: Diagnosis not present

## 2016-02-08 DIAGNOSIS — Z85841 Personal history of malignant neoplasm of brain: Secondary | ICD-10-CM | POA: Insufficient documentation

## 2016-02-08 DIAGNOSIS — Z87891 Personal history of nicotine dependence: Secondary | ICD-10-CM | POA: Diagnosis not present

## 2016-02-08 DIAGNOSIS — R030 Elevated blood-pressure reading, without diagnosis of hypertension: Secondary | ICD-10-CM

## 2016-02-08 DIAGNOSIS — C711 Malignant neoplasm of frontal lobe: Secondary | ICD-10-CM

## 2016-02-08 DIAGNOSIS — Z5112 Encounter for antineoplastic immunotherapy: Secondary | ICD-10-CM | POA: Diagnosis not present

## 2016-02-08 DIAGNOSIS — E119 Type 2 diabetes mellitus without complications: Secondary | ICD-10-CM | POA: Insufficient documentation

## 2016-02-08 DIAGNOSIS — Z794 Long term (current) use of insulin: Secondary | ICD-10-CM | POA: Diagnosis not present

## 2016-02-08 DIAGNOSIS — M549 Dorsalgia, unspecified: Secondary | ICD-10-CM | POA: Insufficient documentation

## 2016-02-08 DIAGNOSIS — R109 Unspecified abdominal pain: Secondary | ICD-10-CM | POA: Diagnosis present

## 2016-02-08 DIAGNOSIS — I10 Essential (primary) hypertension: Secondary | ICD-10-CM | POA: Insufficient documentation

## 2016-02-08 DIAGNOSIS — IMO0001 Reserved for inherently not codable concepts without codable children: Secondary | ICD-10-CM

## 2016-02-08 DIAGNOSIS — E039 Hypothyroidism, unspecified: Secondary | ICD-10-CM | POA: Diagnosis not present

## 2016-02-08 DIAGNOSIS — C719 Malignant neoplasm of brain, unspecified: Secondary | ICD-10-CM

## 2016-02-08 LAB — URINALYSIS, ROUTINE W REFLEX MICROSCOPIC
BILIRUBIN URINE: NEGATIVE
GLUCOSE, UA: NEGATIVE mg/dL
HGB URINE DIPSTICK: NEGATIVE
Ketones, ur: NEGATIVE mg/dL
Leukocytes, UA: NEGATIVE
Nitrite: NEGATIVE
PROTEIN: NEGATIVE mg/dL
SPECIFIC GRAVITY, URINE: 1.018 (ref 1.005–1.030)
pH: 6 (ref 5.0–8.0)

## 2016-02-08 LAB — CBC
HCT: 36.5 % (ref 36.0–46.0)
Hemoglobin: 13 g/dL (ref 12.0–15.0)
MCH: 33.4 pg (ref 26.0–34.0)
MCHC: 35.6 g/dL (ref 30.0–36.0)
MCV: 93.8 fL (ref 78.0–100.0)
PLATELETS: 215 10*3/uL (ref 150–400)
RBC: 3.89 MIL/uL (ref 3.87–5.11)
RDW: 12.8 % (ref 11.5–15.5)
WBC: 9.6 10*3/uL (ref 4.0–10.5)

## 2016-02-08 LAB — COMPREHENSIVE METABOLIC PANEL
ALBUMIN: 3.4 g/dL — AB (ref 3.5–5.0)
ALK PHOS: 52 U/L (ref 38–126)
ALT: 19 U/L (ref 14–54)
AST: 20 U/L (ref 15–41)
Anion gap: 7 (ref 5–15)
BUN: 19 mg/dL (ref 6–20)
CALCIUM: 8.6 mg/dL — AB (ref 8.9–10.3)
CO2: 24 mmol/L (ref 22–32)
Chloride: 108 mmol/L (ref 101–111)
Creatinine, Ser: 0.72 mg/dL (ref 0.44–1.00)
GFR calc Af Amer: >60 mL/min (ref >60)
GFR calc non Af Amer: >60 mL/min (ref >60)
GLUCOSE: 111 mg/dL — AB (ref 65–99)
Potassium: 3.6 mmol/L (ref 3.5–5.1)
SODIUM: 139 mmol/L (ref 135–145)
Total Bilirubin: 0.7 mg/dL (ref 0.3–1.2)
Total Protein: 5.6 g/dL — ABNORMAL LOW (ref 6.5–8.1)

## 2016-02-08 LAB — LIPASE, BLOOD: Lipase: 29 U/L (ref 11–51)

## 2016-02-08 MED ORDER — SODIUM CHLORIDE 0.9% FLUSH
10.0000 mL | INTRAVENOUS | Status: DC | PRN
  Administered 2016-02-08: 10 mL
  Filled 2016-02-08: qty 10

## 2016-02-08 MED ORDER — FAMOTIDINE IN NACL 20-0.9 MG/50ML-% IV SOLN
20.0000 mg | Freq: Once | INTRAVENOUS | Status: AC
  Administered 2016-02-08: 20 mg via INTRAVENOUS

## 2016-02-08 MED ORDER — HEPARIN SOD (PORK) LOCK FLUSH 100 UNIT/ML IV SOLN
500.0000 [IU] | Freq: Once | INTRAVENOUS | Status: AC | PRN
  Administered 2016-02-08: 500 [IU]
  Filled 2016-02-08: qty 5

## 2016-02-08 MED ORDER — FAMOTIDINE IN NACL 20-0.9 MG/50ML-% IV SOLN
INTRAVENOUS | Status: AC
  Filled 2016-02-08: qty 50

## 2016-02-08 MED ORDER — SODIUM CHLORIDE 0.9 % IV SOLN
10.0000 mg/kg | Freq: Once | INTRAVENOUS | Status: AC
  Administered 2016-02-08: 750 mg via INTRAVENOUS
  Filled 2016-02-08: qty 30

## 2016-02-08 MED ORDER — SODIUM CHLORIDE 0.9 % IV SOLN
Freq: Once | INTRAVENOUS | Status: AC
  Administered 2016-02-08: 10:00:00 via INTRAVENOUS

## 2016-02-08 NOTE — Progress Notes (Signed)
Pt arrived in ifusion in NAD. States her abdominal pain is just an ache now. Denies nausea/vomiting. Drinking coke and nibbling on cheese and crackers. Given IV Pepcid as ordered.

## 2016-02-08 NOTE — Progress Notes (Signed)
Received call from lobby stating pt wishing to speak with me regarding ED visit last night.  Pt states she experienced RUQ abdominal pain that radiated to her back.  According to pt and ED notes, she was worked up for several things including gallstones which were all ruled out.  Abd [place] ordered which was also negative.  Pt discharged from ED.  Pt states the pain has resolved she just feels "sore" in her abdomen.  Pt unsure of whether she could receive Avastin treatment because of this episode.  Information reviewed with Dr. Lindi Adie who states pt is still able to receive Avastin treatment today.  Labs from ED visit all within normal range and thus do not need to be repeated this morning prior to treatment per Dr. Lindi Adie.  Verbal order given for pt to receive IV Pepcid while in treatment room.  Pt instructed to take Protonix BID and to notify us should she experience this pain again or any additional symptoms.  Pt verbalized understanding and agrees to contact us with any further questions or concerns.  All information regarding plan of care relayed to [facility] RN.

## 2016-02-08 NOTE — Discharge Instructions (Signed)
Tylenol as needed for pain.  Call your primary care physician in the morning to schedule a follow up appointment in regard's to your abdominal pain. Your blood pressure was also elevated today, so I would like your primary physician to recheck this as well.   Please seek immediate care if you develop any of the following symptoms: The pain does not go away.  You have a fever.  You keep throwing up (vomiting). You pass bloody or black tarry stools.  There is bright red blood in the stool.  There is rectal pain.  You do not seem to be getting better.  You have any questions or concerns.

## 2016-02-08 NOTE — Patient Instructions (Signed)
[facility] Discharge Instructions for Patients Receiving Chemotherapy  Today you received the following chemotherapy agents: Avastin.  To help prevent nausea and vomiting after your treatment, we encourage you to take your nausea medication: Lorazepam as directed.   If you develop nausea and vomiting that is not controlled by your nausea medication, call the clinic.   BELOW ARE SYMPTOMS THAT SHOULD BE REPORTED IMMEDIATELY:  *FEVER GREATER THAN 100.5 F  *CHILLS WITH OR WITHOUT FEVER  NAUSEA AND VOMITING THAT IS NOT CONTROLLED WITH YOUR NAUSEA MEDICATION  *UNUSUAL SHORTNESS OF BREATH  *UNUSUAL BRUISING OR BLEEDING  TENDERNESS IN MOUTH AND THROAT WITH OR WITHOUT PRESENCE OF ULCERS  *URINARY PROBLEMS  *BOWEL PROBLEMS  UNUSUAL RASH Items with * indicate a potential emergency and should be followed up as soon as possible.  Feel free to call the clinic you have any questions or concerns. The clinic phone number is [phone].  Please show the [place] at check-in to the Emergency Department and triage nurse.

## 2016-02-08 NOTE — ED Provider Notes (Signed)
CSN: [id number]     Arrival date & time 02/08/16  0204 History   First MD Initiated Contact with Patient 02/08/16 [phone]     Chief Complaint  Patient presents with  . Abdominal Pain  . Back Pain    (Consider location/radiation/quality/duration/timing/severity/associated sxs/prior Treatment) Patient is a 60 y.o. female presenting with abdominal pain and back pain. The history is provided by the patient and medical records. No language interpreter was used.  Abdominal Pain Associated symptoms: diarrhea and nausea   Associated symptoms: no chills, no cough, no dysuria, no fever, no shortness of breath and no vomiting   Back Pain Associated symptoms: abdominal pain   Associated symptoms: no dysuria, no fever, no headaches and no weakness    Michelle Duran is a 60 y.o. female  with a PMH of HTN, DM, hypothyroidism, brain neoplasm currently undergoing treatment who presents to the Emergency Department complaining of RUQ abdominal pain which began today. Patient states pain is better now then it was at onset. Tylenol taken with some relief. Associated symptoms include nausea, one episode of loose stool, low back pain. Denies fever, emesis, dysuria.   Past Medical History  Diagnosis Date  . Gallstone   . Hypertension   . Hypothyroidism   . Mitral valve prolapse   . Fibromyalgia   . Anxiety   . Vitamin D deficiency   . Diabetes mellitus without complication ([place])   . Tobacco abuse   . Mass of adrenal gland ([place])   . Wears glasses   . Complication of anesthesia     Pt was shaking after having epidural  during c-section  . PVC's (premature ventricular contractions)     saw cardiologist Dr. Claiborne Billings > 3 years ago with echo and stress (entered 08/08/2015)  . Vertigo   . Numbness and tingling     fingers and toes  . Arthritis   . GERD (gastroesophageal reflux disease)   . History of hiatal hernia   . Nocturia   . Family history of adverse reaction to anesthesia     mother had PONV  . Panic  attacks   . Neoplasm of brain ([place])   . Seizures [facility])    Past Surgical History  Procedure Laterality Date  . Cesarean section    . Multiple tooth extractions    . Esophagus stretched    . Craniotomy Right 08/09/2015    Procedure: Right Frontotemporal craniotomy for tumor resection with stereotactic navigation;  Surgeon: Kevan Ny Ditty, MD;  Location: [facility];  Service: Neurosurgery;  Laterality: Right;  Right Frontotemporal craniotomy for tumor resection with stereotactic navigation   Family History  Problem Relation Age of Onset  . Hypertension Father   . Glaucoma    . Ovarian cancer Cousin   . Breast cancer Paternal Grandmother    Social History  Substance Use Topics  . Smoking status: Former Smoker -- 1.00 packs/day for 41 years    Types: Cigarettes    Quit date: 07/24/2015  . Smokeless tobacco: Never Used  . Alcohol Use: No   OB History    No data available     Review of Systems  Constitutional: Negative for fever and chills.  HENT: Negative for congestion.   Eyes: Negative for visual disturbance.  Respiratory: Negative for cough and shortness of breath.   Cardiovascular: Negative.   Gastrointestinal: Positive for nausea, abdominal pain and diarrhea. Negative for vomiting and blood in stool.  Genitourinary: Negative for dysuria.  Musculoskeletal: Positive for back pain.  Negative for neck pain.  Skin: Negative for rash.  Neurological: Negative for dizziness, weakness and headaches.      Allergies  Codeine; Prednisone; and Sulfonamide derivatives  Home Medications   Prior to Admission medications   Medication Sig Start Date End Date Taking? Authorizing Provider  acetaminophen (TYLENOL) 650 MG CR tablet Take 1,300 mg by mouth every 8 (eight) hours as needed for pain. Reported on 11/09/2015    Historical Provider, MD  B-D ULTRAFINE III SHORT PEN 31G X 8 MM MISC  11/03/15   Historical Provider, MD  clonazePAM (KLONOPIN) 0.5 MG disintegrating tablet  DISSOLVE 1 TABLET IN MOUTH EVERY 12 HOURS AS NEEDED SEIZURE 11/01/15   Historical Provider, MD  dexamethasone (DECADRON) 4 MG tablet Take 1 tablet (4 mg total) by mouth daily. 01/05/16   Nicholas Lose, MD  Insulin Glargine (LANTUS) 100 UNIT/ML Solostar Pen Inject 8 Units into the skin daily at 10 pm. 11/28/15   Nicholas Lose, MD  levETIRAcetam (KEPPRA) 500 MG tablet TAKE 3 TABLETS BY MOUTH TWICE A DAY 01/24/16   Tyler Pita, MD  LORazepam (ATIVAN) 0.5 MG tablet TAKE 1 TABLET BY MOUTH EVERY 4 TO 6 HOURS AS NEEDED FOR NAUSEA 01/30/16   Nicholas Lose, MD  pantoprazole (PROTONIX) 40 MG tablet Take 1 tablet (40 mg total) by mouth daily. Switch for any other PPI at similar dose and frequency 01/05/16   Nicholas Lose, MD  sertraline (ZOLOFT) 100 MG tablet Take 150 mg by mouth at bedtime. Reported on 11/09/2015    Historical Provider, MD  SYNTHROID 25 MCG tablet Take 12.5 mcg by mouth daily before breakfast.  10/13/15   Historical Provider, MD   BP 168/94 mmHg  Pulse 49  Temp(Src) 98 F (36.7 C) (Oral)  Resp 18  SpO2 100% Physical Exam  Constitutional: She is oriented to person, place, and time. She appears well-developed and well-nourished.  Alert and in no acute distress  HENT:  Head: Normocephalic and atraumatic.  Cardiovascular: Normal rate, regular rhythm, normal heart sounds and intact distal pulses.   Pulmonary/Chest: Effort normal and breath sounds normal. No respiratory distress.  Abdominal: Soft. Bowel sounds are normal. She exhibits no distension and no mass. There is tenderness (RUQ, epigastrium). There is no rebound and no guarding.  No CVA tenderness.   Musculoskeletal: She exhibits no edema.  No midline or paraspinal tenderness.   Neurological: She is alert and oriented to person, place, and time.  Skin: Skin is warm and dry.  Nursing note and vitals reviewed.   ED Course  Procedures (including critical care time) Labs Review Labs Reviewed  LIPASE, BLOOD  COMPREHENSIVE METABOLIC PANEL   CBC  URINALYSIS, ROUTINE W REFLEX MICROSCOPIC (NOT AT [facility])    Imaging Review No results found. I have personally reviewed and evaluated these images and lab results as part of my medical decision-making.   EKG Interpretation None      MDM   Final diagnoses:  RUQ pain   Michelle Duran presents with RUQ/epigastric pain that began today. Associated nausea and low back pain. Will obtain labs, UA, and RUQ [place]. Offered meds for pain/nausea - patient states symptoms are not that bad at present.   Labs: UA, CBC, lipase wdl; CMP reassuring.  Imaging: RUQ [place] with no acute abnormalities.   Patient is nontoxic, nonseptic appearing, in no apparent distress.Patient does not meet the SIRS or Sepsis criteria. On repeat exam patient does not have a surgical abdomin and there are no peritoneal signs. No indication  of appendicitis, bowel obstruction, bowel perforation, cholecystitis, diverticulitis. Patient discharged home with instructions for follow-up with their primary care physician. I have also discussed reasons to return immediately to the ER. Patient expresses understanding and agrees with plan.  Patient discussed with Dr. Dina Rich who agrees with treatment plan.     [facility] Ward, PA-C 02/08/16 [id number]  Merryl Hacker, MD 02/08/16 0600

## 2016-02-08 NOTE — ED Notes (Signed)
Patient presents for mid upper abdominal pain starting approximately one hour PTA. Also c/o lower back pain and one episode of watery diarrhea. Denies fever, N/V or urinary symptoms.

## 2016-02-09 ENCOUNTER — Telehealth: Payer: Self-pay | Admitting: *Deleted

## 2016-02-09 DIAGNOSIS — K219 Gastro-esophageal reflux disease without esophagitis: Secondary | ICD-10-CM

## 2016-02-09 MED ORDER — PANTOPRAZOLE SODIUM 40 MG PO TBEC: 40.0000 mg | DELAYED_RELEASE_TABLET | Freq: Two times a day (BID) | ORAL | Status: DC

## 2016-02-09 NOTE — Telephone Encounter (Signed)
TC from patient requesting refill of her Protonix as she is taking 2 /day now per Dr. Lindi Adie. States she is feeling better than yesterday. Refill done.@ BID dosing.

## 2016-02-13 ENCOUNTER — Other Ambulatory Visit: Payer: Self-pay | Admitting: Radiation Therapy

## 2016-02-13 ENCOUNTER — Ambulatory Visit: Payer: Self-pay

## 2016-02-13 ENCOUNTER — Encounter: Payer: Self-pay | Admitting: Hematology and Oncology

## 2016-02-13 ENCOUNTER — Ambulatory Visit: Payer: BC Managed Care – PPO

## 2016-02-13 DIAGNOSIS — C719 Malignant neoplasm of brain, unspecified: Secondary | ICD-10-CM

## 2016-02-13 NOTE — Progress Notes (Signed)
Met with patient in my office for [place]. Approved patient for one-time $400 grant. Patient has a copy of expenses it covers as well as signed grant form along with our Outpatient Pharmacy information. Explained to patient how grant works and if she is having to pay for medications out of pocket that the doctors here prescribe, she may want to reserve money from her grant for [employer], and utilities which she expressed she had a concern. Reviewed her balance for [facility] and explained her copay for seeing the doctor and her self-pay balance reflected several copays. Advised patient she can apply for financial assistance to assist with all [place] balances. Wrote information down for her and explained to her and her mother who was with her. Completed FA application, printed her pay stubs from when she was working from her employer website, and gave her a list of what was left to bring. Spent over an hour with the patient and her mother to be sure they understood what to do. Patient has my card for any additional financial questions or concerns. Patient was very appreciative and wanted to give a  compliment to my boss. Escorted patient to [place].

## 2016-02-19 ENCOUNTER — Other Ambulatory Visit: Payer: Self-pay | Admitting: Hematology and Oncology

## 2016-02-22 ENCOUNTER — Other Ambulatory Visit (HOSPITAL_BASED_OUTPATIENT_CLINIC_OR_DEPARTMENT_OTHER): Payer: BC Managed Care – PPO

## 2016-02-22 ENCOUNTER — Telehealth: Payer: Self-pay | Admitting: Hematology and Oncology

## 2016-02-22 ENCOUNTER — Ambulatory Visit (HOSPITAL_BASED_OUTPATIENT_CLINIC_OR_DEPARTMENT_OTHER): Payer: BC Managed Care – PPO | Admitting: Hematology and Oncology

## 2016-02-22 ENCOUNTER — Encounter: Payer: Self-pay | Admitting: Hematology and Oncology

## 2016-02-22 ENCOUNTER — Ambulatory Visit: Payer: Self-pay

## 2016-02-22 ENCOUNTER — Ambulatory Visit (HOSPITAL_BASED_OUTPATIENT_CLINIC_OR_DEPARTMENT_OTHER): Payer: BC Managed Care – PPO

## 2016-02-22 ENCOUNTER — Ambulatory Visit: Payer: BC Managed Care – PPO

## 2016-02-22 VITALS — BP 140/70 | HR 54 | Resp 18

## 2016-02-22 VITALS — BP 149/90 | HR 48 | Temp 98.2°F | Resp 18 | Wt 180.4 lb

## 2016-02-22 DIAGNOSIS — C711 Malignant neoplasm of frontal lobe: Secondary | ICD-10-CM | POA: Diagnosis not present

## 2016-02-22 DIAGNOSIS — C719 Malignant neoplasm of brain, unspecified: Secondary | ICD-10-CM

## 2016-02-22 DIAGNOSIS — C712 Malignant neoplasm of temporal lobe: Secondary | ICD-10-CM | POA: Diagnosis not present

## 2016-02-22 DIAGNOSIS — Z5112 Encounter for antineoplastic immunotherapy: Secondary | ICD-10-CM

## 2016-02-22 DIAGNOSIS — D496 Neoplasm of unspecified behavior of brain: Secondary | ICD-10-CM

## 2016-02-22 LAB — CBC WITH DIFFERENTIAL/PLATELET
BASO%: 1.2 % (ref 0.0–2.0)
BASOS ABS: 0.1 10*3/uL (ref 0.0–0.1)
EOS ABS: 0.1 10*3/uL (ref 0.0–0.5)
EOS%: 0.9 % (ref 0.0–7.0)
HCT: 38.2 % (ref 34.8–46.6)
HEMOGLOBIN: 12.9 g/dL (ref 11.6–15.9)
LYMPH%: 30.3 % (ref 14.0–49.7)
MCH: 33.2 pg (ref 25.1–34.0)
MCHC: 33.9 g/dL (ref 31.5–36.0)
MCV: 98.2 fL (ref 79.5–101.0)
MONO#: 0.6 10*3/uL (ref 0.1–0.9)
MONO%: 7.9 % (ref 0.0–14.0)
NEUT#: 4.6 10*3/uL (ref 1.5–6.5)
NEUT%: 59.7 % (ref 38.4–76.8)
Platelets: 183 10*3/uL (ref 145–400)
RBC: 3.89 10*6/uL (ref 3.70–5.45)
RDW: 13.1 % (ref 11.2–14.5)
WBC: 7.7 10*3/uL (ref 3.9–10.3)
lymph#: 2.3 10*3/uL (ref 0.9–3.3)

## 2016-02-22 LAB — COMPREHENSIVE METABOLIC PANEL
ALBUMIN: 3 g/dL — AB (ref 3.5–5.0)
ALK PHOS: 49 U/L (ref 40–150)
ALT: 14 U/L (ref 0–55)
AST: 11 U/L (ref 5–34)
Anion Gap: 10 mEq/L (ref 3–11)
BUN: 17.6 mg/dL (ref 7.0–26.0)
CHLORIDE: 108 meq/L (ref 98–109)
CO2: 22 meq/L (ref 22–29)
Calcium: 8.6 mg/dL (ref 8.4–10.4)
Creatinine: 0.8 mg/dL (ref 0.6–1.1)
EGFR: 82 mL/min/{1.73_m2} — AB (ref >90)
GLUCOSE: 117 mg/dL (ref 70–140)
POTASSIUM: 3.8 meq/L (ref 3.5–5.1)
SODIUM: 139 meq/L (ref 136–145)
Total Bilirubin: 0.39 mg/dL (ref 0.20–1.20)
Total Protein: 5.5 g/dL — ABNORMAL LOW (ref 6.4–8.3)

## 2016-02-22 MED ORDER — SODIUM CHLORIDE 0.9 % IJ SOLN
10.0000 mL | INTRAMUSCULAR | Status: DC | PRN
  Administered 2016-02-22: 10 mL via INTRAVENOUS
  Filled 2016-02-22: qty 10

## 2016-02-22 MED ORDER — SODIUM CHLORIDE 0.9% FLUSH
10.0000 mL | INTRAVENOUS | Status: DC | PRN
  Administered 2016-02-22: 10 mL
  Filled 2016-02-22: qty 10

## 2016-02-22 MED ORDER — SODIUM CHLORIDE 0.9 % IV SOLN
Freq: Once | INTRAVENOUS | Status: AC
  Administered 2016-02-22: 10:00:00 via INTRAVENOUS

## 2016-02-22 MED ORDER — SODIUM CHLORIDE 0.9 % IV SOLN
10.0000 mg/kg | Freq: Once | INTRAVENOUS | Status: AC
  Administered 2016-02-22: 750 mg via INTRAVENOUS
  Filled 2016-02-22: qty 30

## 2016-02-22 MED ORDER — HEPARIN SOD (PORK) LOCK FLUSH 100 UNIT/ML IV SOLN
500.0000 [IU] | Freq: Once | INTRAVENOUS | Status: AC | PRN
  Administered 2016-02-22: 500 [IU]
  Filled 2016-02-22: qty 5

## 2016-02-22 NOTE — Patient Instructions (Signed)

## 2016-02-22 NOTE — Telephone Encounter (Signed)
appt made and avs printed °

## 2016-02-22 NOTE — Progress Notes (Signed)
Patient Care Team: Carol Ada, MD as PCP - General (Family Medicine)  DIAGNOSIS: No matching staging information was found for the patient.  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   12/14/2015 -  Chemotherapy Avastin 10 mg/kg every 2 weeks    CHIEF COMPLIANT: Currently on Avastin  INTERVAL HISTORY: Michelle Duran is a 60 year old with above-mentioned history of right frontal and temporal lobe resection for glioblastoma who progressed on adjuvant Temodar with radiation. She was prescribed Avastin every 2 weeks on 12/31/2015. It appears that she is tolerating Avastin fairly well. She does have bruising on her hands as well as slightly elevated blood pressure. She went to the emergency room for epigastric pain and was felt to be nonspecific in etiology.   REVIEW OF SYSTEMS:   Constitutional: Denies fevers, chills or abnormal weight loss Eyes: Denies blurriness of vision Ears, nose, mouth, throat, and face: Denies mucositis or sore throat Respiratory: Denies cough, dyspnea or wheezes Cardiovascular: Denies palpitation, chest discomfort Gastrointestinal:  Denies nausea, heartburn or change in bowel habits Skin: Denies abnormal skin rashes Lymphatics: Denies new lymphadenopathy or easy  bruising Neurological:Denies numbness, tingling or new weaknesses Behavioral/Psych: Mood is stable, no new changes  Extremities: No lower extremity edema Breast:  denies any pain or lumps or nodules in either breasts All other systems were reviewed with the patient and are negative.  I have reviewed the past medical history, past surgical history, social history and family history with the patient and they are unchanged from previous note.  ALLERGIES:  is allergic to codeine; prednisone; and sulfonamide derivatives.  MEDICATIONS:  Current Outpatient Prescriptions  Medication Sig Dispense Refill  . acetaminophen (TYLENOL) 650 MG CR tablet Take 1,300 mg by mouth every 8 (eight) hours as needed for pain. Reported on 11/09/2015    . B-D ULTRAFINE III SHORT PEN 31G X 8 MM MISC     . clonazePAM (KLONOPIN) 0.5 MG disintegrating tablet DISSOLVE 1 TABLET IN MOUTH EVERY 12 HOURS AS NEEDED SEIZURE  0  . dexamethasone (DECADRON) 4 MG tablet Take 1 tablet (4 mg total) by mouth daily. 30 tablet 1  . Insulin Glargine (LANTUS) 100 UNIT/ML Solostar Pen Inject 8 Units into the skin daily at 10 pm. 15 mL 11  . levETIRAcetam (KEPPRA) 500 MG tablet TAKE 3 TABLETS BY MOUTH TWICE A DAY 180 tablet 0  . LORazepam (ATIVAN) 0.5 MG tablet TAKKE 1 TABLET BY MOUTH EVERY 4-6 HOURS AS NEEDED FOR NAUSEA 30 tablet 0  . pantoprazole (PROTONIX) 40 MG tablet Take 1 tablet (40 mg total) by mouth 2 (two) times daily. 60 tablet 1  . sertraline (ZOLOFT) 100 MG tablet Take 150 mg by mouth at bedtime. Reported on 11/09/2015    . SYNTHROID 25 MCG tablet Take 12.5 mcg by mouth daily before breakfast.  0   No current facility-administered medications for this visit.   Facility-Administered Medications Ordered in Other Visits  Medication Dose Route Frequency Provider Last Rate Last Dose  . SONAFINE emulsion 1 application  1 application Topical BID Tyler Pita, MD   1 application at 65/46/50 1517    PHYSICAL EXAMINATION: ECOG  PERFORMANCE STATUS: 1 - Symptomatic but completely ambulatory  Filed Vitals:   02/22/16 0931  BP: 149/90  Pulse: 48  Temp: 98.2 F (36.8 C)  Resp: 18   Filed Weights   02/22/16 0931  Weight: 180 lb 6.4 oz (81.829 kg)    GENERAL:alert, no distress and comfortable SKIN: skin color, texture, turgor are normal, no rashes or significant lesions EYES: normal, Conjunctiva are pink and non-injected, sclera clear OROPHARYNX:no exudate, no erythema and lips, buccal mucosa, and tongue normal  NECK: supple, thyroid normal size, non-tender, without nodularity LYMPH:  no palpable lymphadenopathy in the cervical, axillary or inguinal LUNGS: clear to auscultation and percussion with normal breathing effort HEART: regular rate & rhythm and no murmurs and no lower extremity edema ABDOMEN:abdomen soft, non-tender and normal bowel sounds MUSCULOSKELETAL:no cyanosis of digits and no clubbing  NEURO: alert & oriented x 3 with fluent speech, no focal motor/sensory deficits EXTREMITIES: No lower extremity edema BREAST: No palpable masses or nodules in either right or left breasts. No palpable axillary supraclavicular or infraclavicular adenopathy no breast tenderness or nipple discharge. (exam performed in the presence of a chaperone)  LABORATORY DATA:  I have reviewed the data as listed   Chemistry      Component Value Date/Time   NA 139 02/08/2016 0300   NA 143 01/25/2016 0834   K 3.6 02/08/2016 0300   K 3.5 01/25/2016 0834   CL 108 02/08/2016 0300   CO2 24 02/08/2016 0300   CO2 22 01/25/2016 0834   BUN 19 02/08/2016 0300   BUN 17.1 01/25/2016 0834   CREATININE 0.72 02/08/2016 0300   CREATININE 1.0 01/25/2016 0834      Component Value Date/Time   CALCIUM 8.6* 02/08/2016 0300   CALCIUM 9.2 01/25/2016 0834   ALKPHOS 52 02/08/2016 0300   ALKPHOS 56 01/25/2016 0834   AST 20 02/08/2016 0300   AST 10 01/25/2016 0834   ALT 19 02/08/2016 0300   ALT 21 01/25/2016 0834   BILITOT 0.7 02/08/2016  0300   BILITOT 0.41 01/25/2016 0834       Lab Results  Component Value Date   WBC 7.7 02/22/2016   HGB 12.9 02/22/2016   HCT 38.2 02/22/2016   MCV 98.2 02/22/2016   PLT 183 02/22/2016   NEUTROABS 4.6 02/22/2016     ASSESSMENT & PLAN:  Glioblastoma multiforme of brain ([place]) Brain MRI 07/24/2015: Heterogeneous enhancing right frontal lobe mass 4.7 x 3.6 x 3.3 cm with vasogenic edema, heterogeneous enhancing mid right temporal lobe mass 4.1 x 3.1 x 2.2 cm with edema and right-to-left shift and mass effect. 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 and MGMT Methylation: Not Detected) Suggests Poorer response to chemo-XRT and Poor prognosis ----------------------------------------------------------------------------------------------------------------------------------------------------------- Treatment summary:Concurrent chemoradiation with daily temozolomide at 75 mg/m (180 mg dose) started 08/29/2015 completed 10/11/2015 MRI brain 10/23/2015: Enhancement right temporal lobe cavity 4.3 x 3.7 cm previously 3.3 cm, second right frontal lobe nodule 3 x 3.3 cm, previously 2.9 cm, right mesial frontal lobe nodule 11 mm.  Treatment plan: Delay in starting treatment because patient missed appointments for  port placement  Avastin 10 mg/kg every 2 weeks starting at 12/14/2015, today is cycle 4  Avastin toxicities:  1. Petechial rash 2. Bruising on the arms and thighs   Brain MRI was done by Dr. Cyndy Freeze revealed improvement with Avastin. Next MRI of the brain has been scheduled for 03/25/2016.  Return to clinic in after the brain MRI for follow-up and every 2 weeks for Avastin    No orders of the defined types were placed in this encounter.   The patient has a good understanding of the overall plan. she agrees with it. she will call with any problems that may develop before the  next visit here.   Rulon Eisenmenger, MD 02/22/2016

## 2016-02-22 NOTE — Progress Notes (Signed)
Patient returned some documents to go with [facility] FAA application. Placed in [place] to be delivered to [facility].

## 2016-02-22 NOTE — Patient Instructions (Signed)
[facility] Discharge Instructions for Patients Receiving Chemotherapy  Today you received the following chemotherapy agents: Avastin.  To help prevent nausea and vomiting after your treatment, we encourage you to take your nausea medication: Lorazepam as directed.   If you develop nausea and vomiting that is not controlled by your nausea medication, call the clinic.   BELOW ARE SYMPTOMS THAT SHOULD BE REPORTED IMMEDIATELY:  *FEVER GREATER THAN 100.5 F  *CHILLS WITH OR WITHOUT FEVER  NAUSEA AND VOMITING THAT IS NOT CONTROLLED WITH YOUR NAUSEA MEDICATION  *UNUSUAL SHORTNESS OF BREATH  *UNUSUAL BRUISING OR BLEEDING  TENDERNESS IN MOUTH AND THROAT WITH OR WITHOUT PRESENCE OF ULCERS  *URINARY PROBLEMS  *BOWEL PROBLEMS  UNUSUAL RASH Items with * indicate a potential emergency and should be followed up as soon as possible.  Feel free to call the clinic you have any questions or concerns. The clinic phone number is [phone].  Please show the [place] at check-in to the Emergency Department and triage nurse.

## 2016-02-22 NOTE — Assessment & Plan Note (Signed)
Brain MRI 07/24/2015: Heterogeneous enhancing right frontal lobe mass 4.7 x 3.6 x 3.3 cm with vasogenic edema, heterogeneous enhancing mid right temporal lobe mass 4.1 x 3.1 x 2.2 cm with edema and right-to-left shift and mass effect. 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 and MGMT Methylation: Not Detected) Suggests Poorer response to chemo-XRT and Poor prognosis ----------------------------------------------------------------------------------------------------------------------------------------------------------- Treatment summary:Concurrent chemoradiation with daily temozolomide at 75 mg/m (180 mg dose) started 08/29/2015 completed 10/11/2015 MRI brain 10/23/2015: Enhancement right temporal lobe cavity 4.3 x 3.7 cm previously 3.3 cm, second right frontal lobe nodule 3 x 3.3 cm, previously 2.9 cm, right mesial frontal lobe nodule 11 mm.  Treatment plan: Delay in starting treatment because patient missed appointments for port placement  Avastin 10 mg/kg every 2 weeks starting at 12/14/2015, today is cycle 4  Avastin toxicities:  1. Petechial rash 2. Bruising on the arms and thighs   Brain MRI was done by Dr. Cyndy Freeze revealed improvement with Avastin.  Return to clinic in 4 weeks for follow-up every 2 weeks for Avastin;

## 2016-03-01 ENCOUNTER — Encounter: Payer: Self-pay | Admitting: Hematology and Oncology

## 2016-03-01 NOTE — Progress Notes (Signed)
Patient came in to bring additional documentation to send with [facility] FAA. Sent interoffice mail to business office Attn: Therapist, art.

## 2016-03-07 ENCOUNTER — Ambulatory Visit: Payer: BC Managed Care – PPO

## 2016-03-07 ENCOUNTER — Ambulatory Visit (HOSPITAL_BASED_OUTPATIENT_CLINIC_OR_DEPARTMENT_OTHER): Payer: BC Managed Care – PPO | Admitting: Nurse Practitioner

## 2016-03-07 ENCOUNTER — Other Ambulatory Visit: Payer: Self-pay

## 2016-03-07 ENCOUNTER — Ambulatory Visit: Payer: Self-pay

## 2016-03-07 ENCOUNTER — Other Ambulatory Visit: Payer: Self-pay | Admitting: Hematology and Oncology

## 2016-03-07 ENCOUNTER — Ambulatory Visit (HOSPITAL_BASED_OUTPATIENT_CLINIC_OR_DEPARTMENT_OTHER): Payer: BC Managed Care – PPO

## 2016-03-07 ENCOUNTER — Other Ambulatory Visit (HOSPITAL_BASED_OUTPATIENT_CLINIC_OR_DEPARTMENT_OTHER): Payer: BC Managed Care – PPO

## 2016-03-07 VITALS — BP 149/84 | HR 53 | Temp 98.0°F | Resp 18

## 2016-03-07 DIAGNOSIS — D496 Neoplasm of unspecified behavior of brain: Secondary | ICD-10-CM

## 2016-03-07 DIAGNOSIS — R002 Palpitations: Secondary | ICD-10-CM | POA: Diagnosis not present

## 2016-03-07 DIAGNOSIS — I10 Essential (primary) hypertension: Secondary | ICD-10-CM

## 2016-03-07 DIAGNOSIS — C711 Malignant neoplasm of frontal lobe: Secondary | ICD-10-CM

## 2016-03-07 DIAGNOSIS — C712 Malignant neoplasm of temporal lobe: Secondary | ICD-10-CM

## 2016-03-07 DIAGNOSIS — C719 Malignant neoplasm of brain, unspecified: Secondary | ICD-10-CM

## 2016-03-07 DIAGNOSIS — Z5112 Encounter for antineoplastic immunotherapy: Secondary | ICD-10-CM | POA: Diagnosis not present

## 2016-03-07 LAB — CBC WITH DIFFERENTIAL/PLATELET
BASO%: 0 % (ref 0.0–2.0)
Basophils Absolute: 0 10*3/uL (ref 0.0–0.1)
EOS%: 0.2 % (ref 0.0–7.0)
Eosinophils Absolute: 0 10*3/uL (ref 0.0–0.5)
HCT: 38.2 % (ref 34.8–46.6)
HGB: 13 g/dL (ref 11.6–15.9)
LYMPH%: 10.6 % — AB (ref 14.0–49.7)
MCH: 32.9 pg (ref 25.1–34.0)
MCHC: 34 g/dL (ref 31.5–36.0)
MCV: 96.7 fL (ref 79.5–101.0)
MONO#: 0.7 10*3/uL (ref 0.1–0.9)
MONO%: 7.5 % (ref 0.0–14.0)
NEUT%: 81.7 % — ABNORMAL HIGH (ref 38.4–76.8)
NEUTROS ABS: 7.3 10*3/uL — AB (ref 1.5–6.5)
Platelets: 206 10*3/uL (ref 145–400)
RBC: 3.95 10*6/uL (ref 3.70–5.45)
RDW: 12.8 % (ref 11.2–14.5)
WBC: 8.9 10*3/uL (ref 3.9–10.3)
lymph#: 0.9 10*3/uL (ref 0.9–3.3)

## 2016-03-07 LAB — COMPREHENSIVE METABOLIC PANEL
ALT: 23 U/L (ref 0–55)
AST: 12 U/L (ref 5–34)
Albumin: 3.1 g/dL — ABNORMAL LOW (ref 3.5–5.0)
Alkaline Phosphatase: 67 U/L (ref 40–150)
Anion Gap: 12 mEq/L — ABNORMAL HIGH (ref 3–11)
BILIRUBIN TOTAL: 0.33 mg/dL (ref 0.20–1.20)
BUN: 18.6 mg/dL (ref 7.0–26.0)
CHLORIDE: 107 meq/L (ref 98–109)
CO2: 21 meq/L — AB (ref 22–29)
CREATININE: 0.9 mg/dL (ref 0.6–1.1)
Calcium: 8.7 mg/dL (ref 8.4–10.4)
EGFR: 75 mL/min/{1.73_m2} — AB (ref >90)
GLUCOSE: 197 mg/dL — AB (ref 70–140)
Potassium: 4.1 mEq/L (ref 3.5–5.1)
SODIUM: 139 meq/L (ref 136–145)
TOTAL PROTEIN: 5.8 g/dL — AB (ref 6.4–8.3)

## 2016-03-07 LAB — URINALYSIS, MICROSCOPIC - CHCC
BILIRUBIN (URINE): NEGATIVE
BLOOD: NEGATIVE
Glucose: NEGATIVE mg/dL
Ketones: 5 mg/dL
Leukocyte Esterase: NEGATIVE
NITRITE: NEGATIVE
Protein: <30 mg/dL
RBC / HPF: NEGATIVE (ref 0–2)
Specific Gravity, Urine: 1.03 (ref 1.003–1.035)
Urobilinogen, UR: 0.2 mg/dL (ref 0.2–1)
WBC, UA: NEGATIVE (ref 0–2)
pH: 5 (ref 4.6–8.0)

## 2016-03-07 MED ORDER — SODIUM CHLORIDE 0.9 % IV SOLN
Freq: Once | INTRAVENOUS | Status: AC
  Administered 2016-03-07: 14:00:00 via INTRAVENOUS

## 2016-03-07 MED ORDER — SODIUM CHLORIDE 0.9 % IJ SOLN
10.0000 mL | INTRAMUSCULAR | Status: DC | PRN
  Administered 2016-03-07: 10 mL via INTRAVENOUS
  Filled 2016-03-07: qty 10

## 2016-03-07 MED ORDER — BEVACIZUMAB CHEMO INJECTION 400 MG/16ML
10.0000 mg/kg | Freq: Once | INTRAVENOUS | Status: AC
  Administered 2016-03-07: 750 mg via INTRAVENOUS
  Filled 2016-03-07: qty 26

## 2016-03-07 MED ORDER — SODIUM CHLORIDE 0.9% FLUSH
10.0000 mL | INTRAVENOUS | Status: DC | PRN
  Administered 2016-03-07: 10 mL
  Filled 2016-03-07: qty 10

## 2016-03-07 MED ORDER — HEPARIN SOD (PORK) LOCK FLUSH 100 UNIT/ML IV SOLN
500.0000 [IU] | Freq: Once | INTRAVENOUS | Status: AC | PRN
  Administered 2016-03-07: 500 [IU]
  Filled 2016-03-07: qty 5

## 2016-03-07 NOTE — Progress Notes (Signed)
Per Dr Lindi Adie OK to proceed with Avastin today with BP 143/90

## 2016-03-07 NOTE — Progress Notes (Signed)
Pt reports foul smelling urine , denies other symptoms. Urine specimen obtained.

## 2016-03-07 NOTE — Patient Instructions (Signed)
[facility] Discharge Instructions for Patients Receiving Chemotherapy  Today you received the following chemotherapy agents Avastin To help prevent nausea and vomiting after your treatment, we encourage you to take your nausea medication as prescribed.   If you develop nausea and vomiting that is not controlled by your nausea medication, call the clinic.   BELOW ARE SYMPTOMS THAT SHOULD BE REPORTED IMMEDIATELY:  *FEVER GREATER THAN 100.5 F  *CHILLS WITH OR WITHOUT FEVER  NAUSEA AND VOMITING THAT IS NOT CONTROLLED WITH YOUR NAUSEA MEDICATION  *UNUSUAL SHORTNESS OF BREATH  *UNUSUAL BRUISING OR BLEEDING  TENDERNESS IN MOUTH AND THROAT WITH OR WITHOUT PRESENCE OF ULCERS  *URINARY PROBLEMS  *BOWEL PROBLEMS  UNUSUAL RASH Items with * indicate a potential emergency and should be followed up as soon as possible.  Feel free to call the clinic you have any questions or concerns. The clinic phone number is [phone].  Please show the CHEMO ALERT CARD at check-in to the Emergency Department and triage nurse.   

## 2016-03-11 ENCOUNTER — Encounter: Payer: Self-pay | Admitting: Nurse Practitioner

## 2016-03-11 DIAGNOSIS — R002 Palpitations: Secondary | ICD-10-CM | POA: Insufficient documentation

## 2016-03-11 MED FILL — UNIFINE PENTIPS 8MM 31G: 31G X 8 MM | 90 days supply | Qty: 100 | Fill #0

## 2016-03-11 MED FILL — PANTOPRAZOLE SOD DR 40 MG T: 40 | 30 days supply | Qty: 60 | Fill #0

## 2016-03-11 NOTE — Assessment & Plan Note (Signed)
Patient states she has a history of chronic, intermittent palpitations for the past several months.  She complained of palpitations following the completion of her Avastin infusion today again.  She denied any actual chest pain, chest pressure, shortness of breath, or pain with inspiration.  Blood pressure was elevated to 143/90; which patient states is typical for her when she is at the cancer center.  Otherwise, vital signs were stable and patient was afebrile.  O2 sat remained normal.  EKG obtained today revealed a sinus bradycardia at rate of 50 with a QTC of 408.  With further questioning-patient stated that she does have a cardiologist Dr. Shelva Majestic that she is seen in the past.  Reviewed all findings with Dr. Renold Genta he advised that the dexamethasone.  The patient is taking on a daily basis for her diagnosed brain tumor could be exacerbating patient's palpitation issues.  Patient was advised to follow-up with Dr. Claiborne Billings, cardiologist, for further evaluation and management.  Also, patient was advised to go directly to the emergency department for any worsening symptoms.  Patient stated she was comfortable going home with family; and will go to the emergency department for any worsening symptoms whatsoever.

## 2016-03-11 NOTE — Assessment & Plan Note (Signed)
Patient has a history of hypertension; but does not appear to be taking any blood pressure medications at this time.  Blood pressure at the cancer Center was 143/90.  Patient was advised to return home and recheck her blood pressure.  She should keep a blood pressure log and follow up with either her primary care physician or her cardiologist if her blood pressure remains elevated.  Also, patient was encouraged to call the [facility] if her blood pressure remains elevated as well.

## 2016-03-11 NOTE — Progress Notes (Addendum)
SYMPTOM MANAGEMENT CLINIC    Chief Complaint: Palpitations  HPI:  Michelle Duran 60 y.o. female diagnosed with glioblastoma multiform.  Currently undergoing Avastin infusions.   Patient states she has a history of chronic, intermittent palpitations for the past several months.  She complained of palpitations following the completion of her Avastin infusion today again.  She denied any actual chest pain, chest pressure, shortness of breath, or pain with inspiration.  Blood pressure was elevated to 143/90; which patient states is typical for her when she is at the cancer center.  Otherwise, vital signs were stable and patient was afebrile.  O2 sat remained normal.  EKG obtained today revealed a sinus bradycardia at rate of 50 with a QTC of 408.  With further questioning-patient stated that she does have a cardiologist Dr. Shelva Majestic that she is seen in the past.  Reviewed all findings with Dr. Renold Genta he advised that the dexamethasone.  The patient is taking on a daily basis for her diagnosed brain tumor could be exacerbating patient's palpitation issues.  Patient was advised to follow-up with Dr. Claiborne Billings, cardiologist, for further evaluation and management.  Also, patient was advised to go directly to the emergency department for any worsening symptoms.  Patient stated she was comfortable going home with family; and will go to the emergency department for any worsening symptoms whatsoever.    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   12/14/2015 -  Chemotherapy Avastin 10 mg/kg every 2 weeks    Review of Systems  Cardiovascular: Positive for palpitations.  All other systems reviewed and are negative.   Past Medical History  Diagnosis Date  . Gallstone   . Hypertension   . Hypothyroidism   . Mitral valve prolapse   . Fibromyalgia   . Anxiety   . Vitamin D deficiency   . Diabetes mellitus without complication ([place])   . Tobacco abuse   . Mass of adrenal gland ([place])   . Wears glasses   . Complication of anesthesia     Pt was shaking after having epidural  during c-section  . PVC's (premature ventricular contractions)     saw cardiologist Dr. Claiborne Billings > 3 years ago with echo and stress (entered 08/08/2015)  . Vertigo   . Numbness and tingling     fingers and toes  . Arthritis   . GERD (gastroesophageal reflux disease)   . History of hiatal hernia   . Nocturia   . Family history of adverse reaction to anesthesia     mother had PONV  . Panic attacks   . Neoplasm of brain ([place])   . Seizures [facility])     Past Surgical History  Procedure Laterality Date  . Cesarean section    . Multiple tooth extractions    . Esophagus stretched    . Craniotomy Right  08/09/2015    Procedure: Right Frontotemporal craniotomy for tumor resection with stereotactic navigation;  Surgeon: Kevan Ny Ditty, MD;  Location: [place] [facility];  Service: Neurosurgery;  Laterality: Right;  Right Frontotemporal craniotomy for tumor resection with stereotactic navigation    has Hypothyroidism; PANIC DISORDER; MIGRAINE HEADACHE; Essential hypertension; Mitral valve disorder; CARDIAC ARRHYTHMIA; GERD; HIATAL HERNIA; IRRITABLE BOWEL SYNDROME; RECTAL FISSURE; FIBROMYALGIA; ANAL FISSURE, HX OF; Brain mass; Facial droop;  Slurred speech; Glioblastoma multiforme of brain ([place]); Depression; Anxiety state; Tobacco abuse; Diabetes mellitus, iatrogenic ([place]); Brain neoplasm ([place]); Dental abscess; DNR (do not resuscitate); Palliative care encounter; Seizures ([place]); Petechial rash; Peripheral edema; and Palpitations on her problem list.    is allergic to codeine; prednisone; and sulfonamide derivatives.    Medication List       This list is accurate as of: 03/07/16 11:59 PM.  Always use your most recent med list.               acetaminophen 650 MG CR tablet  Commonly known as:  TYLENOL  Take 1,300 mg by mouth every 8 (eight) hours as needed for pain. Reported on 11/09/2015     B-D ULTRAFINE III SHORT PEN 31G X 8 MM Misc  Generic drug:  Insulin Pen Needle     clonazePAM 0.5 MG disintegrating tablet  Commonly known as:  KLONOPIN  DISSOLVE 1 TABLET IN MOUTH EVERY 12 HOURS AS NEEDED SEIZURE     dexamethasone 4 MG tablet  Commonly known as:  DECADRON  Take 1 tablet (4 mg total) by mouth daily.     Insulin Glargine 100 UNIT/ML Solostar Pen  Commonly known as:  LANTUS  Inject 8 Units into the skin daily at 10 pm.     levETIRAcetam 500 MG tablet  Commonly known as:  KEPPRA  TAKE 3 TABLETS BY MOUTH TWICE A DAY     LORazepam 0.5 MG tablet  Commonly known as:  ATIVAN  TAKKE 1 TABLET BY MOUTH EVERY 4-6 HOURS AS NEEDED FOR NAUSEA     pantoprazole 40 MG tablet  Commonly known as:  PROTONIX  Take 1 tablet (40 mg total) by mouth 2 (two) times daily.     sertraline 100 MG tablet  Commonly known as:  ZOLOFT  Take 150 mg by mouth at bedtime. Reported on 11/09/2015     SYNTHROID 25 MCG tablet  Generic drug:  levothyroxine  Take 12.5 mcg by mouth daily before breakfast.         PHYSICAL EXAMINATION  Oncology Vitals 03/07/2016 03/07/2016  Weight - -  Weight (lbs) - -  Temp 98 -  Pulse 53 54  Resp 18 18  SpO2 98 99   BP Readings from Last 2 Encounters:  03/07/16 149/84  02/22/16 140/70    Physical Exam    Constitutional: She is oriented to person, place, and time and well-developed, well-nourished, and in no distress.  HENT:  Head: Normocephalic and atraumatic.  Mouth/Throat: Oropharynx is clear and moist.  Eyes: Conjunctivae and EOM are normal. Pupils are equal, round, and reactive to light. Right eye exhibits no discharge. Left eye exhibits no discharge. No scleral icterus.  Neck: Normal range of motion. Neck supple. No JVD present. No tracheal deviation present. No thyromegaly present.  Cardiovascular: Normal rate, regular rhythm, normal heart sounds and intact distal pulses.   Pulmonary/Chest: Effort normal and breath sounds normal. No respiratory distress. She has no wheezes. She has no rales. She exhibits no tenderness.  Abdominal: Soft. Bowel sounds are  normal. She exhibits no distension and no mass. There is no tenderness. There is no rebound and no guarding.  Musculoskeletal: Normal range of motion. She exhibits no edema or tenderness.  Lymphadenopathy:    She has no cervical adenopathy.  Neurological: She is alert and oriented to person, place, and time. Gait normal.  Skin: Skin is warm and dry. No rash noted. No erythema. No pallor.  Psychiatric: Affect normal.  Nursing note and vitals reviewed.   LABORATORY DATA:. Appointment on 03/07/2016  Component Date Value Ref Range Status  . WBC 03/07/2016 8.9  3.9 - 10.3 10e3/uL Final  . NEUT# 03/07/2016 7.3* 1.5 - 6.5 10e3/uL Final  . HGB 03/07/2016 13.0  11.6 - 15.9 g/dL Final  . HCT 03/07/2016 38.2  34.8 - 46.6 % Final  . Platelets 03/07/2016 206  145 - 400 10e3/uL Final  . MCV 03/07/2016 96.7  79.5 - 101.0 fL Final  . MCH 03/07/2016 32.9  25.1 - 34.0 pg Final  . MCHC 03/07/2016 34.0  31.5 - 36.0 g/dL Final  . RBC 03/07/2016 3.95  3.70 - 5.45 10e6/uL Final  . RDW 03/07/2016 12.8  11.2 - 14.5 % Final  . lymph# 03/07/2016 0.9  0.9 - 3.3 10e3/uL Final  . MONO# 03/07/2016 0.7  0.1 - 0.9 10e3/uL Final  . Eosinophils Absolute  03/07/2016 0.0  0.0 - 0.5 10e3/uL Final  . Basophils Absolute 03/07/2016 0.0  0.0 - 0.1 10e3/uL Final  . NEUT% 03/07/2016 81.7* 38.4 - 76.8 % Final  . LYMPH% 03/07/2016 10.6* 14.0 - 49.7 % Final  . MONO% 03/07/2016 7.5  0.0 - 14.0 % Final  . EOS% 03/07/2016 0.2  0.0 - 7.0 % Final  . BASO% 03/07/2016 0.0  0.0 - 2.0 % Final  . Sodium 03/07/2016 139  136 - 145 mEq/L Final  . Potassium 03/07/2016 4.1  3.5 - 5.1 mEq/L Final  . Chloride 03/07/2016 107  98 - 109 mEq/L Final  . CO2 03/07/2016 21* 22 - 29 mEq/L Final  . Glucose 03/07/2016 197* 70 - 140 mg/dl Final   Glucose reference range is for nonfasting patients. Fasting glucose reference range is 70- 100.  Marland Kitchen BUN 03/07/2016 18.6  7.0 - 26.0 mg/dL Final  . Creatinine 03/07/2016 0.9  0.6 - 1.1 mg/dL Final  . Total Bilirubin 03/07/2016 0.33  0.20 - 1.20 mg/dL Final  . Alkaline Phosphatase 03/07/2016 67  40 - 150 U/L Final  . AST 03/07/2016 12  5 - 34 U/L Final  . ALT 03/07/2016 23  0 - 55 U/L Final  . Total Protein 03/07/2016 5.8* 6.4 - 8.3 g/dL Final  . Albumin 03/07/2016 3.1* 3.5 - 5.0 g/dL Final  . Calcium 03/07/2016 8.7  8.4 - 10.4 mg/dL Final  . Anion Gap 03/07/2016 12* 3 - 11 mEq/L Final  . EGFR 03/07/2016 75* >90 ml/min/1.73 m2 Final   eGFR is calculated using the CKD-EPI Creatinine Equation (2009)  . Glucose 03/07/2016 Negative  Negative mg/dL Final  . Bilirubin (Urine) 03/07/2016 Negative  Negative Final  . Ketones 03/07/2016 5  Negative mg/dL Final  . Specific Gravity, Urine 03/07/2016 1.030  1.003 - 1.035 Final  . Blood 03/07/2016 Negative  Negative Final  . pH 03/07/2016 5.0  4.6 - 8.0 Final  . Protein 03/07/2016 < 30  Negative- <30 mg/dL Final  . Urobilinogen, UR 03/07/2016 0.2  0.2 - 1 mg/dL Final  . Nitrite 03/07/2016 Negative  Negative Final  . Leukocyte Esterase 03/07/2016 Negative  Negative Final  .  RBC / HPF 03/07/2016 Negative  0 - 2 Final  . WBC, UA 03/07/2016 Negative  0 - 2 Final  . Crystals 03/07/2016 Ca Oxalate   Negative Final  . Epithelial Cells 03/07/2016 Occasional  Negative- Few Final   EKG:  Michelle Duran, Michelle Duran FW:[id number] 07-Mar-2016 15:18:30 [facility] ROUTINE RECORD Sinus bradycardia Minimal voltage criteria for LVH, may be normal variant Borderline ECG No significant change since last tracing Confirmed by ROSS MD, PAULA ([id number]) on 03/08/2016 10:12:34 PM 92m/s 134mmV _0  9.0.4 12SL 237 CID: [id number] Referred by: Shakaria Raphael Confirmed By: PADorris CarnesD Vent. rate 50 BPM PR interval 146 ms QRS duration 84 ms QT/QTc 448/408 ms P-R-T axes 60 6 69 [id number]/13/589 yr) Female Caucasian Room: Loc:511 Technician: Test ind: RADIOGRAPHIC STUDIES: No results found.  ASSESSMENT/PLAN:    Palpitations Patient states she has a history of chronic, intermittent palpitations for the past several months.  She complained of palpitations following the completion of her Avastin infusion today again.  She denied any actual chest pain, chest pressure, shortness of breath, or pain with inspiration.  Blood pressure was elevated to 143/90; which patient states is typical for her when she is at the cancer center.  Otherwise, vital signs were stable and patient was afebrile.  O2 sat remained normal.  EKG obtained today revealed a sinus bradycardia at rate of 50 with a QTC of 408.  With further questioning-patient stated that she does have a cardiologist Dr. ThShelva Majestichat she is seen in the past.  Reviewed all findings with Dr. GuRenold Gentae advised that the dexamethasone.  The patient is taking on a daily basis for her diagnosed brain tumor could be exacerbating patient's palpitation issues.  Patient was advised to follow-up with Dr. KeClaiborne Billingscardiologist, for further evaluation and management.  Also, patient was advised to go directly to the emergency department for any worsening symptoms.  Patient stated she was comfortable going home with family; and will go to the emergency department for any  worsening symptoms whatsoever.  [facility].  She is scheduled to return for labs, flush, and her next Avastin infusion on 03/21/2016.  She is scheduled for a brain MRI on 03/25/2016.  Essential hypertension Patient has a history of hypertension; but does not appear to be taking any blood pressure medications at this time.  Blood pressure at the cancer Center was 143/90.  Patient was advised to return home and recheck her blood pressure.  She should keep a blood pressure log and follow up with either her primary care physician or her cardiologist if her blood pressure remains elevated.  Also, patient was encouraged to call the [facility] blood pressure remains elevated as well.   Patient stated understanding of all instructions; and was in agreement with this plan of care. The patient knows to call the clinic with any problems, questions or concerns.   Total time spent with patient was 25 minutes;  with greater than 75 percent of that time spent in face to face counseling regarding patient's symptoms,  and coordination of care and follow up.  Disclaimer:This dictation was prepared with Dragon/digital dictation along with SmApple ComputerAny transcriptional errors that result from this process are unintentional.  BaDrue SecondNP 03/11/2016

## 2016-03-11 NOTE — Assessment & Plan Note (Signed)
Patient is admitted to the [facility] today to receive her Avastin infusion.  She is scheduled to return for labs, flush, and her next Avastin infusion on 03/21/2016.  She is scheduled for a brain MRI on 03/25/2016.

## 2016-03-12 ENCOUNTER — Telehealth: Payer: Self-pay

## 2016-03-12 NOTE — Telephone Encounter (Signed)
Received VM from pt regarding concern with petechial rash.  Pt reports she feels it is getting worse and is concerned she is actively bleeding.  Pt reports the rash has started to move up her arm and has some areas of cracking and bleeding. I reviewed labs and plt count from 03/07/16 found to be 206.  I discussed all information with Dr. Lindi Adie who offered to evaluate rash sometime today if pt is willing to pop in and show him or she can be set up to be seen by him tomorrow afternoon.  I called pt back to discuss these options with her.  Pt states she has to come over for prescription pick up today and could be seen when she comes over for this.  As for prescriptions pt referring to, I did not see any documentation of anyone working on refills.  Pt then stated it was for protonix and insulin pen needles and that someone was working on these prescriptions so that she could utilize her grant money.  Pt unsure of details of this so I instructed her I would look into this further and give her a call back once prescriptions were taken care of.  I then spoke with [place], financial advocate, who stated that pt could use grant money for these prescriptions by using [facility] vs[facility].  [name] stated all pt had to do was contact [facility] to have prescriptions transferred to them and pt could pick them up once available.  Due to pt disease state, I contacted [facility] to transfer prescriptions for pt.  [facility] stated these had already been transferred and they were ready for pick up at pt convenience.  I then called pt back and was unable to reach her.  I left a VM explaining that prescriptions were available for pick up at her convenience at [place] and also requesting pt call back to let us know if she would like to stop in today for rash evaluation or for an appointment to be scheduled tomorrow afternoon with Dr. Lindi Adie.

## 2016-03-13 ENCOUNTER — Telehealth: Payer: Self-pay

## 2016-03-13 NOTE — Telephone Encounter (Signed)
Called to follow up with patient after being seen in [facility] 03/07/16 for palpations. States her palpitations are better with just a few intermittently. States she watches her caffeine intake and things that aggravate it. States she will call Dr. Claiborne Billings her cardiologist to inform him of her palpations. States her BP was better at home when checking it. Informed patient to call back with any questions or concerns. Patient verbalized understanding.

## 2016-03-21 ENCOUNTER — Ambulatory Visit: Payer: BC Managed Care – PPO

## 2016-03-21 ENCOUNTER — Ambulatory Visit (HOSPITAL_BASED_OUTPATIENT_CLINIC_OR_DEPARTMENT_OTHER): Payer: BC Managed Care – PPO

## 2016-03-21 ENCOUNTER — Other Ambulatory Visit (HOSPITAL_BASED_OUTPATIENT_CLINIC_OR_DEPARTMENT_OTHER): Payer: BC Managed Care – PPO

## 2016-03-21 VITALS — BP 154/87 | HR 54 | Temp 97.3°F | Resp 18

## 2016-03-21 DIAGNOSIS — Z5112 Encounter for antineoplastic immunotherapy: Secondary | ICD-10-CM

## 2016-03-21 DIAGNOSIS — C719 Malignant neoplasm of brain, unspecified: Secondary | ICD-10-CM

## 2016-03-21 DIAGNOSIS — C712 Malignant neoplasm of temporal lobe: Secondary | ICD-10-CM

## 2016-03-21 DIAGNOSIS — D496 Neoplasm of unspecified behavior of brain: Secondary | ICD-10-CM

## 2016-03-21 DIAGNOSIS — C711 Malignant neoplasm of frontal lobe: Secondary | ICD-10-CM

## 2016-03-21 LAB — COMPREHENSIVE METABOLIC PANEL
ALT: 22 U/L (ref 0–55)
AST: 14 U/L (ref 5–34)
Albumin: 3.2 g/dL — ABNORMAL LOW (ref 3.5–5.0)
Alkaline Phosphatase: 58 U/L (ref 40–150)
Anion Gap: 12 mEq/L — ABNORMAL HIGH (ref 3–11)
BUN: 16.4 mg/dL (ref 7.0–26.0)
CHLORIDE: 108 meq/L (ref 98–109)
CO2: 23 mEq/L (ref 22–29)
Calcium: 9.1 mg/dL (ref 8.4–10.4)
Creatinine: 1 mg/dL (ref 0.6–1.1)
EGFR: 65 mL/min/{1.73_m2} — ABNORMAL LOW (ref >90)
Glucose: 186 mg/dl — ABNORMAL HIGH (ref 70–140)
POTASSIUM: 3.8 meq/L (ref 3.5–5.1)
Sodium: 143 mEq/L (ref 136–145)
TOTAL PROTEIN: 6.1 g/dL — AB (ref 6.4–8.3)
Total Bilirubin: 0.41 mg/dL (ref 0.20–1.20)

## 2016-03-21 LAB — CBC WITH DIFFERENTIAL/PLATELET
BASO%: 0.1 % (ref 0.0–2.0)
Basophils Absolute: 0 10*3/uL (ref 0.0–0.1)
EOS%: 0.3 % (ref 0.0–7.0)
Eosinophils Absolute: 0 10*3/uL (ref 0.0–0.5)
HEMATOCRIT: 41 % (ref 34.8–46.6)
HGB: 14.2 g/dL (ref 11.6–15.9)
LYMPH#: 1.6 10*3/uL (ref 0.9–3.3)
LYMPH%: 15.5 % (ref 14.0–49.7)
MCH: 33.5 pg (ref 25.1–34.0)
MCHC: 34.6 g/dL (ref 31.5–36.0)
MCV: 96.7 fL (ref 79.5–101.0)
MONO#: 0.6 10*3/uL (ref 0.1–0.9)
MONO%: 5.8 % (ref 0.0–14.0)
NEUT%: 78.3 % — AB (ref 38.4–76.8)
NEUTROS ABS: 7.9 10*3/uL — AB (ref 1.5–6.5)
PLATELETS: 212 10*3/uL (ref 145–400)
RBC: 4.24 10*6/uL (ref 3.70–5.45)
RDW: 12.9 % (ref 11.2–14.5)
WBC: 10.1 10*3/uL (ref 3.9–10.3)

## 2016-03-21 MED ORDER — SODIUM CHLORIDE 0.9 % IV SOLN
Freq: Once | INTRAVENOUS | Status: AC
  Administered 2016-03-21: 11:00:00 via INTRAVENOUS

## 2016-03-21 MED ORDER — SODIUM CHLORIDE 0.9 % IV SOLN
10.0000 mg/kg | Freq: Once | INTRAVENOUS | Status: AC
  Administered 2016-03-21: 750 mg via INTRAVENOUS
  Filled 2016-03-21: qty 30

## 2016-03-21 MED ORDER — SODIUM CHLORIDE 0.9 % IJ SOLN
10.0000 mL | INTRAMUSCULAR | Status: DC | PRN
  Administered 2016-03-21: 10 mL via INTRAVENOUS
  Filled 2016-03-21: qty 10

## 2016-03-21 MED ORDER — HEPARIN SOD (PORK) LOCK FLUSH 100 UNIT/ML IV SOLN
500.0000 [IU] | Freq: Once | INTRAVENOUS | Status: AC | PRN
  Administered 2016-03-21: 500 [IU]
  Filled 2016-03-21: qty 5

## 2016-03-21 MED ORDER — SODIUM CHLORIDE 0.9% FLUSH
10.0000 mL | INTRAVENOUS | Status: DC | PRN
  Administered 2016-03-21: 10 mL
  Filled 2016-03-21: qty 10

## 2016-03-25 ENCOUNTER — Ambulatory Visit (HOSPITAL_COMMUNITY)
Admission: RE | Admit: 2016-03-25 | Discharge: 2016-03-25 | Disposition: A | Discharge status: Home / Self Care | Payer: BC Managed Care – PPO | Source: Ambulatory Visit | Attending: Radiation Oncology | Admitting: Radiation Oncology

## 2016-03-25 ENCOUNTER — Telehealth: Payer: Self-pay

## 2016-03-25 DIAGNOSIS — C719 Malignant neoplasm of brain, unspecified: Secondary | ICD-10-CM | POA: Diagnosis not present

## 2016-03-25 MED ORDER — GADOBENATE DIMEGLUMINE 529 MG/ML IV SOLN
17.0000 mL | Freq: Once | INTRAVENOUS | Status: AC | PRN
  Administered 2016-03-25: 17 mL via INTRAVENOUS

## 2016-03-25 MED ORDER — HEPARIN SOD (PORK) LOCK FLUSH 100 UNIT/ML IV SOLN
500.0000 [IU] | INTRAVENOUS | Status: AC | PRN
  Administered 2016-03-25: 500 [IU]

## 2016-03-25 NOTE — Telephone Encounter (Signed)
Pt had called over the weekend. On thursday when they deaccessed her port it was bleeding a bit so a larger dressing was placed. Pt was asking if she could take a shower. Pt stated the port is not bleeding. Instructed pt she can take shower. Explained about letting needle sticks to heal several hours before showering to decrease risk of infection

## 2016-03-25 NOTE — Telephone Encounter (Signed)
This nurse called patient leaving message requesting status of port-a-cath site and dressing.  If area is not bleeding okay to shower but do not rub or scrub this area.  Awaiting return call from patient.  See second message from other Triage staff.

## 2016-03-26 ENCOUNTER — Telehealth: Payer: Self-pay

## 2016-03-26 NOTE — Telephone Encounter (Signed)
Pt is asking if Dr Lindi Adie can write prescriptions for ativan, keppra, lancets, and protonix, she is asking b/c she has some grant money at [employer].  Please give pt a call.

## 2016-03-27 NOTE — Telephone Encounter (Signed)
Call from patient asking for refills to be sent to [facility].  "I have been working with [name] H. For help with my medicines and approved for a grant.  I need Ativan, Keppra, Decadron, Lancets for the Accucheck pen and Protonix.  I can be reached at [phone].

## 2016-03-27 NOTE — Telephone Encounter (Signed)
Dr Lindi Adie is out of town.  There is nothing that can be done with this until he returns tomorrow.  LMOVM - Let pt know it would be tomorrow before this can be addressed.  If she calls again, please make sure she understands that filling these prescriptions on the grant will leave less funds available for the cancer medication.

## 2016-03-28 ENCOUNTER — Ambulatory Visit
Admission: RE | Admit: 2016-03-28 | Discharge: 2016-03-28 | Disposition: A | Discharge status: Home / Self Care | Payer: BC Managed Care – PPO | Source: Ambulatory Visit | Attending: Radiation Oncology | Admitting: Radiation Oncology

## 2016-03-28 ENCOUNTER — Encounter: Payer: Self-pay | Admitting: Radiation Oncology

## 2016-03-28 ENCOUNTER — Other Ambulatory Visit: Payer: Self-pay

## 2016-03-28 DIAGNOSIS — Z79899 Other long term (current) drug therapy: Secondary | ICD-10-CM | POA: Insufficient documentation

## 2016-03-28 DIAGNOSIS — Z794 Long term (current) use of insulin: Secondary | ICD-10-CM | POA: Diagnosis not present

## 2016-03-28 DIAGNOSIS — Z923 Personal history of irradiation: Secondary | ICD-10-CM | POA: Insufficient documentation

## 2016-03-28 DIAGNOSIS — D496 Neoplasm of unspecified behavior of brain: Secondary | ICD-10-CM

## 2016-03-28 DIAGNOSIS — C719 Malignant neoplasm of brain, unspecified: Secondary | ICD-10-CM | POA: Diagnosis present

## 2016-03-28 DIAGNOSIS — K219 Gastro-esophageal reflux disease without esophagitis: Secondary | ICD-10-CM

## 2016-03-28 MED ORDER — LORAZEPAM 0.5 MG PO TABS: ORAL_TABLET | ORAL | 0 refills | Status: DC

## 2016-03-28 NOTE — Progress Notes (Addendum)
Weight and vitals stable. Denies pain. Denies headaches, dizziness, nausea or vomiting. Denies tinnitus. Reports hearing is poor. Copious cecum noted in both ears. Encouraged patient to follow up with Dr. Erik Obey. Reports achy pain behind her right eye. Reports blurry vision in right eye. Reports she is off balance. Poor short term memory continues. Denies seizure activity. Reports tingling in her feet continue. Reports taking decadron 2 mg (1/2 of a 4 mg tablet) each morning. No evidence of thrush noted. Patient continues Avastin every two weeks with the last dose on July 20th.  BP (!) 142/82 (BP Location: Right Arm, Patient Position: Sitting, Cuff Size: Normal)   Pulse (!) 55   Resp 16   Wt 183 lb (83 kg)   SpO2 100%   BMI 33.47 kg/m  Wt Readings from Last 3 Encounters:  03/28/16 183 lb (83 kg)  02/22/16 180 lb 6.4 oz (81.8 kg)  01/25/16 176 lb 14.4 oz (80.2 kg)

## 2016-03-28 NOTE — Progress Notes (Signed)
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 Brain neoplasm (HCC) 239.6 D49.6   2. Glioblastoma multiforme of brain (HCC) 191.9 C71.9     Interval Since Last Radiation:  5 months. Completed radiation 10/11/2015.  Narrative:  The patient returns today for routine follow-up. Weight and vitals stable. Denies pain. Denies headaches, dizziness, nausea or vomiting. Denies tinnitus. Reports hearing is poor. Copious cecum noted in both ears. Encouraged patient to follow up with Dr. Erik Obey. Reports achy pain behind her right eye. Reports blurry vision in right eye. Reports she is off balance. Poor short term memory continues. Denies seizure activity. Reports tingling in her feet continue. She mentions that she sometimes feels like she has water droplets on her legs. Reports taking decadron 2 mg (1/2 of a 4 mg tablet) each morning. No evidence of thrush noted. Patient continues Avastin every two weeks with the last dose on July 20th. She is not taking Temodar currently. She mentions it made her sick.   She had an MRI of the brain 03/25/2016 that showed a concern for slight worsening of the right frontal posterior perilesional enhancement but no definite progression when compared with 01/18/2016.  ALLERGIES:  is allergic to codeine; prednisone; and sulfonamide derivatives.  Meds: Current Outpatient Prescriptions  Medication Sig Dispense Refill  . ACCU-CHEK AVIVA PLUS test strip See admin instructions.  1  . acetaminophen (TYLENOL) 650 MG CR tablet Take 1,300 mg by mouth every 8 (eight) hours as needed for pain. Reported on 11/09/2015    . B-D ULTRAFINE III SHORT PEN 31G X 8 MM MISC     .  clonazePAM (KLONOPIN) 0.5 MG disintegrating tablet DISSOLVE 1 TABLET IN MOUTH EVERY 12 HOURS AS NEEDED SEIZURE  0  . dexamethasone (DECADRON) 4 MG tablet Take 1 tablet (4 mg total) by mouth daily. 30 tablet 1  . Insulin Glargine (LANTUS) 100 UNIT/ML Solostar Pen Inject 8 Units into the skin daily at 10 pm. 15 mL 11  . levETIRAcetam (KEPPRA) 500 MG tablet TAKE 3 TABLETS BY MOUTH TWICE A DAY 180 tablet 0  . LORazepam (ATIVAN) 0.5 MG tablet TAKKE 1 TABLET BY MOUTH EVERY 4-6 HOURS AS NEEDED FOR NAUSEA 30 tablet 0  . pantoprazole (PROTONIX) 40 MG tablet Take 1 tablet (40 mg total) by mouth 2 (two) times daily. 60 tablet 1  . sertraline (ZOLOFT) 100 MG tablet Take 150 mg by mouth at bedtime. Reported on 11/09/2015    . SYNTHROID 25 MCG tablet Take 12.5 mcg by mouth daily before breakfast.   0   No current facility-administered medications for this encounter.    Facility-Administered Medications Ordered in Other Encounters  Medication Dose Route Frequency Provider Last Rate Last Dose  . SONAFINE emulsion 1 application  1 application Topical BID Tyler Pita, MD   1 application at 62/69/48 1517    Physical Findings: The patient is in no acute distress. Patient is alert and oriented.  weight is 183 lb (83 kg). Her blood pressure is 142/82 (abnormal) and her pulse is 55 (abnormal). Her respiration is 16 and oxygen saturation is 100%. .  No significant changes.  Lab Findings: Lab Results  Component  Value Date   WBC 10.1 03/21/2016   WBC 9.6 02/08/2016   HGB 14.2 03/21/2016   HCT 41.0 03/21/2016   PLT 212 03/21/2016    Lab Results  Component Value Date   NA 143 03/21/2016   K 3.8 03/21/2016   CHLORIDE 108 03/21/2016   CO2 23 03/21/2016   GLUCOSE 186 (H) 03/21/2016   BUN 16.4 03/21/2016   CREATININE 1.0 03/21/2016   BILITOT 0.41 03/21/2016   ALKPHOS 58 03/21/2016   AST 14 03/21/2016   ALT 22 03/21/2016   PROT 6.1 (L) 03/21/2016   ALBUMIN 3.2 (L) 03/21/2016   CALCIUM 9.1 03/21/2016    ANIONGAP 12 (H) 03/21/2016   ANIONGAP 7 02/08/2016    Radiographic Findings: Mr Jeri Cos [id number] Contrast  Result Date: 03/25/2016 CLINICAL DATA:  Continued surveillance glioblastoma. Confusion, forgetfulness, hearing loss, tremors, weakness in all extremities. Currently undergoing Avastin infusions. Previous decompression. Previous radiation. EXAM: MRI HEAD WITHOUT AND WITH CONTRAST TECHNIQUE: Multiplanar, multiecho pulse sequences of the brain and surrounding structures were obtained without and with intravenous contrast. CONTRAST:  59mL MULTIHANCE GADOBENATE DIMEGLUMINE 529 MG/ML IV SOLN COMPARISON:  Most recent 01/08/2016. FINDINGS: Please note that the previous scan was performed on a 1.5 T unit. Today's examination was performed on a 3 T magnet. Roughly similar RIGHT temporal lesion, at the site of surgery surrounding the RIGHT temporal horn, difficult to measure in the axial plane but having an oblique craniocaudal dimension of 30 mm, stable. Surrounding the RIGHT frontal cavity, similar cross-section of 17 x 25 mm, but nodular enhancement extending posteriorly, see image 100 series 15, suspicious for mild progression. In the LEFT frontal parasagittal region, enhancing tumor, 6 mm diameter, not clearly increased in size from priors but with more avid contrast enhancement. Similarly, spread via subcortical white matter in the RIGHT inferior frontal parasagittal cortex, not clearly larger but more easily visualized. Widespread T2 and FLAIR hyperintensities throughout the cerebral hemispheres, greater on the RIGHT, consistent with nonenhancing tumor, roughly stable. This does extend into the RIGHT brainstem. Chronic blood products at the site of surgery are stable. No acute infarct, midline shift, or extra-axial collection. Mild atrophy. Negative extracranial soft tissues. IMPRESSION: Other than concern for slight worsening of the RIGHT frontal posterior perilesional enhancement, no definite progression when  compared with 01/18/2016. See discussion above. Direct comparison is hampered by the fact that the previous study was performed on 1.5 T unit. Short-term follow-up should be considered. Electronically Signed   By: Staci Righter M.D.   On: 03/25/2016 19:39   Impression:  We reviewed her most recent MRI from 03/25/2016 that doesn't show any definite evidence of progression or recurrence. There are subtle changes that may be attributed to the MRI machine with the stronger 3T magnetic field compared with 1.5T from previous scan.  Plan:  Routine MRI and follow up in 2 months. We discussed that she could possibly benefit from taking Temodar again. She has a follow up appointment with Dr. Lindi Adie 04/04/2016.  _____________________________________  Sheral Apley. Tammi Klippel, M.D.    This document serves as a record of services personally performed by Tyler Pita, MD. It was created on his behalf by Lendon Collar, a trained medical scribe. The creation of this record is based on the scribe's personal observations and the provider's statements to them. This document has been checked and approved by the attending provider.

## 2016-03-29 ENCOUNTER — Other Ambulatory Visit: Payer: Self-pay

## 2016-03-29 ENCOUNTER — Other Ambulatory Visit: Payer: Self-pay | Admitting: Radiation Oncology

## 2016-03-29 ENCOUNTER — Telehealth: Payer: Self-pay | Admitting: Radiation Oncology

## 2016-03-29 DIAGNOSIS — K219 Gastro-esophageal reflux disease without esophagitis: Secondary | ICD-10-CM

## 2016-03-29 DIAGNOSIS — C719 Malignant neoplasm of brain, unspecified: Secondary | ICD-10-CM

## 2016-03-29 MED ORDER — LEVETIRACETAM 500 MG PO TABS: 1500.0000 mg | ORAL_TABLET | Freq: Two times a day (BID) | ORAL | 5 refills | Status: AC

## 2016-03-29 MED ORDER — DEXAMETHASONE 4 MG PO TABS: 4.0000 mg | ORAL_TABLET | Freq: Every day | ORAL | 1 refills | Status: DC

## 2016-03-29 MED ORDER — PANTOPRAZOLE SODIUM 40 MG PO TBEC: 40.0000 mg | DELAYED_RELEASE_TABLET | Freq: Two times a day (BID) | ORAL | 1 refills | Status: DC

## 2016-03-29 NOTE — Telephone Encounter (Signed)
Phoned patient to inquire about Keppra refill. Patient confirms she take 3 tablets in the morning and 3 tablets in the evening (1500 mg bid). Patient confirms this script should be sent to [facility] on [address].

## 2016-03-29 NOTE — Telephone Encounter (Addendum)
Called around to price prescriptions for pt to find option for requested medication refills.  Called pt to discuss but unable to reach pt.  Left VM with information and informed pt to call us back to discuss further and to let me know which pharmacy she wishes prescriptions be sent to.    Received call back from pt to discuss prescriptions.  After discussion, pt wishes for refills to be sent to [facility] on [address].  Pt also requesting Keppra which Dr. Tammi Klippel prescribed and is managing.  Note sent to [facility], RN regarding need for this refill and pt informed.  Ativan faxed and all others e-scribed to [facility].

## 2016-03-29 NOTE — Addendum Note (Signed)
Addended by: Cheree Ditto on: 03/29/2016 11:41 AM   Modules accepted: Orders

## 2016-04-01 ENCOUNTER — Telehealth: Payer: Self-pay

## 2016-04-01 NOTE — Telephone Encounter (Signed)
Received VM from pt dated 03/31/16 1:47pm.  Unsure how message ended up on desk RN VM during weekend hours.  Hilario Quarry notified of occurrence.  Pt reports falling yesterday while visiting her son.  Pt states she tripped on the curb and fell.  Pt reports open wound to elbow which bled after fall and was managed by EMS who came for evaluation.  Pt currently reports being bruised and sore from fall.  Pt reports hitting her chin, knees, elbow, chest, and hip during fall but denies hitting her head on sidewalk.  Pt denies loss of consciousness as cause of fall and states she simply tripped while walking.  Pt denies any other concerns and reports she just wanted to let us know of incidence.  Pt denies needing to be seen for evaluation as EMS evaluated at time of fall.  Pt to be seen as scheduled on 04/04/16 by Dr. Lindi Adie prior to next Avastin treatment.  Pt verbalized understanding to contact us with any further questions, concerns, or symptoms should they arise.  Pt without further complaints at time of call.

## 2016-04-03 NOTE — Assessment & Plan Note (Signed)
Brain MRI 07/24/2015: Heterogeneous enhancing right frontal lobe mass 4.7 x 3.6 x 3.3 cm with vasogenic edema, heterogeneous enhancing mid right temporal lobe mass 4.1 x 3.1 x 2.2 cm with edema and right-to-left shift and mass effect. 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 and MGMT Methylation: Not Detected) Suggests Poorer response to chemo-XRT and Poor prognosis ----------------------------------------------------------------------------------------------------------------------------------------------------------- Treatment summary:Concurrent chemoradiation with daily temozolomide at 75 mg/m (180 mg dose) started 08/29/2015 completed 10/11/2015 MRI brain 10/23/2015: Enhancement right temporal lobe cavity 4.3 x 3.7 cm previously 3.3 cm, second right frontal lobe nodule 3 x 3.3 cm, previously 2.9 cm, right mesial frontal lobe nodule 11 mm.  Treatment plan: Delay in starting treatment because patient missed appointments for port placement  Avastin 10 mg/kg every 2 weeks starting at 12/14/2015, today is cycle 4  Avastin toxicities:  1. Petechial rash 2. Bruising on the arms and thighs 3. Palpitations   Brain MRI was done by Dr. Cyndy Freeze revealed improvement with Avastin. MRI of the brain 03/25/2016: Slight worsening of Right frontal posterior perilesional enhancement. No definite progression Short term F/U recommended  Will obtain another MRI in 2 months  Return to clinic Q 4 weeks and every 2 weeks for Avastin

## 2016-04-04 ENCOUNTER — Telehealth: Payer: Self-pay | Admitting: Hematology and Oncology

## 2016-04-04 ENCOUNTER — Telehealth: Payer: Self-pay | Admitting: *Deleted

## 2016-04-04 ENCOUNTER — Encounter: Payer: Self-pay | Admitting: Hematology and Oncology

## 2016-04-04 ENCOUNTER — Ambulatory Visit (HOSPITAL_BASED_OUTPATIENT_CLINIC_OR_DEPARTMENT_OTHER): Payer: BC Managed Care – PPO | Admitting: Hematology and Oncology

## 2016-04-04 ENCOUNTER — Ambulatory Visit (HOSPITAL_BASED_OUTPATIENT_CLINIC_OR_DEPARTMENT_OTHER): Payer: BC Managed Care – PPO

## 2016-04-04 ENCOUNTER — Other Ambulatory Visit (HOSPITAL_BASED_OUTPATIENT_CLINIC_OR_DEPARTMENT_OTHER): Payer: BC Managed Care – PPO

## 2016-04-04 ENCOUNTER — Ambulatory Visit: Payer: BC Managed Care – PPO

## 2016-04-04 VITALS — BP 148/77 | HR 48

## 2016-04-04 DIAGNOSIS — C711 Malignant neoplasm of frontal lobe: Secondary | ICD-10-CM

## 2016-04-04 DIAGNOSIS — C712 Malignant neoplasm of temporal lobe: Secondary | ICD-10-CM

## 2016-04-04 DIAGNOSIS — L270 Generalized skin eruption due to drugs and medicaments taken internally: Secondary | ICD-10-CM

## 2016-04-04 DIAGNOSIS — Z5112 Encounter for antineoplastic immunotherapy: Secondary | ICD-10-CM

## 2016-04-04 DIAGNOSIS — C719 Malignant neoplasm of brain, unspecified: Secondary | ICD-10-CM

## 2016-04-04 DIAGNOSIS — D496 Neoplasm of unspecified behavior of brain: Secondary | ICD-10-CM

## 2016-04-04 LAB — CBC WITH DIFFERENTIAL/PLATELET
BASO%: 0.4 % (ref 0.0–2.0)
Basophils Absolute: 0 10e3/uL (ref 0.0–0.1)
EOS%: 0.7 % (ref 0.0–7.0)
Eosinophils Absolute: 0.1 10e3/uL (ref 0.0–0.5)
HCT: 41.6 % (ref 34.8–46.6)
HGB: 13.9 g/dL (ref 11.6–15.9)
LYMPH%: 36.9 % (ref 14.0–49.7)
MCH: 32.5 pg (ref 25.1–34.0)
MCHC: 33.5 g/dL (ref 31.5–36.0)
MCV: 97.1 fL (ref 79.5–101.0)
MONO#: 0.6 10e3/uL (ref 0.1–0.9)
MONO%: 8.1 % (ref 0.0–14.0)
NEUT#: 4.3 10e3/uL (ref 1.5–6.5)
NEUT%: 53.9 % (ref 38.4–76.8)
Platelets: 235 10e3/uL (ref 145–400)
RBC: 4.29 10e6/uL (ref 3.70–5.45)
RDW: 13.1 % (ref 11.2–14.5)
WBC: 7.9 10e3/uL (ref 3.9–10.3)
lymph#: 2.9 10e3/uL (ref 0.9–3.3)

## 2016-04-04 LAB — COMPREHENSIVE METABOLIC PANEL
ALT: 18 U/L (ref 0–55)
ANION GAP: 12 meq/L — AB (ref 3–11)
AST: 11 U/L (ref 5–34)
Albumin: 3.2 g/dL — ABNORMAL LOW (ref 3.5–5.0)
Alkaline Phosphatase: 51 U/L (ref 40–150)
BUN: 15.2 mg/dL (ref 7.0–26.0)
CHLORIDE: 109 meq/L (ref 98–109)
CO2: 22 meq/L (ref 22–29)
Calcium: 9.1 mg/dL (ref 8.4–10.4)
Creatinine: 1.1 mg/dL (ref 0.6–1.1)
EGFR: 58 mL/min/{1.73_m2} — AB (ref >90)
Glucose: 174 mg/dl — ABNORMAL HIGH (ref 70–140)
POTASSIUM: 3.1 meq/L — AB (ref 3.5–5.1)
Sodium: 143 mEq/L (ref 136–145)
Total Bilirubin: 0.46 mg/dL (ref 0.20–1.20)
Total Protein: 5.7 g/dL — ABNORMAL LOW (ref 6.4–8.3)

## 2016-04-04 MED ORDER — SODIUM CHLORIDE 0.9 % IJ SOLN
10.0000 mL | INTRAMUSCULAR | Status: DC | PRN
  Administered 2016-04-04: 10 mL via INTRAVENOUS
  Filled 2016-04-04: qty 10

## 2016-04-04 MED ORDER — SODIUM CHLORIDE 0.9 % IV SOLN
10.0000 mg/kg | Freq: Once | INTRAVENOUS | Status: AC
  Administered 2016-04-04: 750 mg via INTRAVENOUS
  Filled 2016-04-04: qty 30

## 2016-04-04 MED ORDER — SODIUM CHLORIDE 0.9% FLUSH
10.0000 mL | INTRAVENOUS | Status: DC | PRN
  Administered 2016-04-04: 10 mL
  Filled 2016-04-04: qty 10

## 2016-04-04 MED ORDER — HEPARIN SOD (PORK) LOCK FLUSH 100 UNIT/ML IV SOLN
500.0000 [IU] | Freq: Once | INTRAVENOUS | Status: AC | PRN
  Administered 2016-04-04: 500 [IU]
  Filled 2016-04-04: qty 5

## 2016-04-04 MED ORDER — SODIUM CHLORIDE 0.9 % IV SOLN
Freq: Once | INTRAVENOUS | Status: AC
  Administered 2016-04-04: 11:00:00 via INTRAVENOUS

## 2016-04-04 NOTE — Telephone Encounter (Signed)
per pof to sch pt appt-*sent MW email to sch 8/17 trmt-pt to get updated copy b4 leaving

## 2016-04-04 NOTE — Telephone Encounter (Signed)
Per staff message and POF I have scheduled appts. Advised scheduler of appts and to move lab/flush JMW  

## 2016-04-04 NOTE — Patient Instructions (Signed)
[facility] Discharge Instructions for Patients Receiving Chemotherapy  Today you received the following chemotherapy agents: Avastin  To help prevent nausea and vomiting after your treatment, we encourage you to take your nausea medication. If you develop nausea and vomiting that is not controlled by your nausea medication, call the clinic.   BELOW ARE SYMPTOMS THAT SHOULD BE REPORTED IMMEDIATELY:  *FEVER GREATER THAN 100.5 F  *CHILLS WITH OR WITHOUT FEVER  NAUSEA AND VOMITING THAT IS NOT CONTROLLED WITH YOUR NAUSEA MEDICATION  *UNUSUAL SHORTNESS OF BREATH  *UNUSUAL BRUISING OR BLEEDING  TENDERNESS IN MOUTH AND THROAT WITH OR WITHOUT PRESENCE OF ULCERS  *URINARY PROBLEMS  *BOWEL PROBLEMS  UNUSUAL RASH Items with * indicate a potential emergency and should be followed up as soon as possible.  Feel free to call the clinic you have any questions or concerns. The clinic phone number is [phone].  Please show the CHEMO ALERT CARD at check-in to the Emergency Department and triage nurse.   

## 2016-04-04 NOTE — Progress Notes (Signed)
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     12/14/2015 -  Chemotherapy    Avastin 10 mg/kg every 2 weeks      CHIEF COMPLIANT: Follow-up after recent brain MRI  INTERVAL HISTORY: Michelle Duran is a 60 year old with above-mentioned history of glioblastoma on the right temporal lobe who is currently on Avastin therapy since [place] 2017. She has had problems with excessive bruising and bleeding on the skin on the forearms. Every time she bumps into something she tends to have a bruise. Recently she fell and bruised some more especially on the xiphoid process. Fortunately she did not bleed anywhere else.  REVIEW OF SYSTEMS:   Constitutional: Denies fevers, chills or abnormal weight loss Eyes: Denies blurriness of vision Ears, nose, mouth, throat, and face: Denies mucositis or sore throat Respiratory: Denies cough, dyspnea or wheezes Cardiovascular: Denies palpitation, chest discomfort Gastrointestinal:  Denies nausea, heartburn or change in bowel habits Skin: Excessive bruises on the forearms left is worse than right Lymphatics: Denies new lymphadenopathy or easy bruising Neurological:  Forgetfulness, numbness and weakness in fourth and fifth fingers Behavioral/Psych: Mood is stable, no new changes  Extremities: No lower extremity edema  All other systems were reviewed with the patient and are negative.  I have reviewed the past medical history, past surgical history, social history and family history with the patient and they are unchanged from previous note.  ALLERGIES:  is allergic to codeine; prednisone; and sulfonamide derivatives.  MEDICATIONS:  Current Outpatient Prescriptions  Medication Sig Dispense Refill  . ACCU-CHEK AVIVA PLUS test strip See admin instructions.  1  . acetaminophen (TYLENOL) 650 MG CR tablet Take 1,300 mg by mouth every 8 (eight) hours as needed for pain. Reported on 11/09/2015    . B-D ULTRAFINE III SHORT PEN 31G X 8 MM MISC     . clonazePAM (KLONOPIN) 0.5 MG disintegrating tablet DISSOLVE 1 TABLET IN MOUTH EVERY 12 HOURS AS NEEDED SEIZURE  0  . dexamethasone (DECADRON) 4 MG tablet Take 1 tablet (4 mg total) by mouth daily. 30 tablet 1  . Insulin Glargine (LANTUS) 100 UNIT/ML Solostar Pen Inject 8 Units into the skin daily at 10 pm. 15 mL 11  . levETIRAcetam (KEPPRA) 500 MG tablet Take 3 tablets (1,500 mg total) by mouth 2 (two) times daily. 180 tablet 5  . LORazepam (ATIVAN) 0.5 MG tablet TAKKE 1 TABLET BY MOUTH EVERY 4-6 HOURS AS NEEDED FOR NAUSEA 30 tablet 0  . pantoprazole (PROTONIX) 40 MG tablet Take 1 tablet (40 mg total) by mouth 2 (two) times daily. 60 tablet 1  . sertraline (ZOLOFT) 100 MG  tablet Take 150 mg by mouth at bedtime. Reported on 11/09/2015    . SYNTHROID 25 MCG tablet Take 12.5 mcg by mouth daily before breakfast.   0   No current facility-administered medications for this visit.    Facility-Administered Medications Ordered in Other Visits  Medication Dose Route Frequency Provider Last Rate Last Dose  . SONAFINE emulsion 1 application  1 application Topical BID Tyler Pita, MD   1 application at 93/81/01 1517     PHYSICAL EXAMINATION: ECOG PERFORMANCE STATUS: 1 - Symptomatic but completely ambulatory  Vitals:   04/04/16 0958  BP: 131/73  Pulse: (!) 59  Resp: 18  Temp: 98.5 F (36.9 C)   Filed Weights   04/04/16 0958  Weight: 179 lb 14.4 oz (81.6 kg)    GENERAL:alert, no distress and comfortable SKIN: skin color, texture, turgor are normal, no rashes or significant lesions EYES: normal, Conjunctiva are pink and non-injected, sclera clear OROPHARYNX:no exudate, no erythema and lips, buccal mucosa, and tongue normal  NECK: supple, thyroid normal size, non-tender, without nodularity LYMPH:  no palpable lymphadenopathy in the cervical, axillary or inguinal LUNGS: clear to auscultation and percussion with normal breathing effort HEART: regular rate & rhythm and no murmurs and no lower extremity edema ABDOMEN:abdomen soft, non-tender and normal bowel sounds MUSCULOSKELETAL:no cyanosis of digits and no clubbing  NEURO: alert & oriented x 3 with fluent speech, no focal motor/sensory deficits EXTREMITIES: No lower extremity edema  LABORATORY DATA:  I have reviewed the data as listed   Chemistry      Component Value Date/Time   NA 143 04/04/2016 0914   K 3.1 (L) 04/04/2016 0914   CL 108 02/08/2016 0300   CO2 22 04/04/2016 0914   BUN 15.2 04/04/2016 0914   CREATININE 1.1 04/04/2016 0914      Component Value Date/Time   CALCIUM 9.1 04/04/2016 0914   ALKPHOS 51 04/04/2016 0914   AST 11 04/04/2016 0914   ALT 18 04/04/2016 0914   BILITOT 0.46 04/04/2016 0914       Lab Results  Component Value Date   WBC 7.9 04/04/2016   HGB 13.9 04/04/2016   HCT 41.6 04/04/2016   MCV 97.1 04/04/2016   PLT 235 04/04/2016   NEUTROABS 4.3 04/04/2016     ASSESSMENT & PLAN:  Glioblastoma multiforme of brain (HCC) Brain MRI 07/24/2015: Heterogeneous enhancing right frontal lobe mass 4.7 x 3.6 x 3.3 cm with vasogenic edema, heterogeneous enhancing mid right temporal lobe mass 4.1 x 3.1 x 2.2 cm  with edema and right-to-left shift and mass effect. 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 and MGMT Methylation: Not Detected) Suggests Poorer response to chemo-XRT and Poor prognosis ----------------------------------------------------------------------------------------------------------------------------------------------------------- Treatment summary:Concurrent chemoradiation with daily temozolomide at 75 mg/m (180 mg dose) started 08/29/2015 completed 10/11/2015 MRI brain 10/23/2015: Enhancement right temporal lobe cavity 4.3 x 3.7 cm previously 3.3 cm, second right frontal lobe nodule 3 x 3.3 cm, previously 2.9 cm, right mesial frontal lobe nodule 11 mm.  Treatment plan: Delay in starting treatment because patient missed appointments for port placement  Avastin 10 mg/kg every 2 weeks starting at 12/14/2015, today is cycle 4  Avastin toxicities:  1. Petechial rash 2. Bruising on the arms and thighs 3. Palpitations   Brain MRI was done by Dr. Cyndy Freeze revealed improvement with Avastin May 2017. MRI of the brain 03/25/2016: Slight worsening of Right frontal posterior perilesional enhancement. No definite  progression Short term F/U recommended I discussed results of the brain MRI with the patient. There is no clear evidence of progression of disease. We'll continue with Avastin every 2 weeks.  Will obtain another MRI in 2 months  Return to clinic Q 4 weeks and every 2 weeks for Avastin    No orders of the defined types were placed in this encounter.  The patient has a good understanding of the overall plan. she agrees with it. she will call with any problems that may develop before the next visit here.   Rulon Eisenmenger, MD 04/04/16

## 2016-04-04 NOTE — Telephone Encounter (Signed)
cld pt & left message of time for 8/17 appt@12 :45

## 2016-04-08 ENCOUNTER — Telehealth: Payer: Self-pay

## 2016-04-08 NOTE — Telephone Encounter (Signed)
Received VM from pt who states she is concerned as her granddaughter who visits her daily is living with a family member who has a MRSA infection.  Pt questioning what she should do as far as precautions to take as well as in regards to being around her granddaughter.  Attempted to call pt back but unable to reach her.  Left VM explaining that although I was unaware of details of family member's infection, it was not something that she should immediately be worried about.  I explained that pt should ensure proper handwashing and avoiding unnecessary physical contact with pt depending on source of infection.  I stated that if this is a known infection of family member's wound, she would want to avoid contact with wound as well as any drainage etc. That may arise from wound.  Pt encouraged to contact us back with additional questions or concerns.

## 2016-04-09 ENCOUNTER — Other Ambulatory Visit: Payer: Self-pay

## 2016-04-09 DIAGNOSIS — K219 Gastro-esophageal reflux disease without esophagitis: Secondary | ICD-10-CM

## 2016-04-09 MED ORDER — PANTOPRAZOLE SODIUM 40 MG PO TBEC: 40.0000 mg | DELAYED_RELEASE_TABLET | Freq: Two times a day (BID) | ORAL | 1 refills | Status: AC

## 2016-04-17 ENCOUNTER — Other Ambulatory Visit: Payer: Self-pay

## 2016-04-17 DIAGNOSIS — C719 Malignant neoplasm of brain, unspecified: Secondary | ICD-10-CM

## 2016-04-18 ENCOUNTER — Other Ambulatory Visit: Payer: Self-pay

## 2016-04-18 ENCOUNTER — Other Ambulatory Visit: Payer: Self-pay | Admitting: *Deleted

## 2016-04-18 ENCOUNTER — Other Ambulatory Visit (HOSPITAL_BASED_OUTPATIENT_CLINIC_OR_DEPARTMENT_OTHER): Payer: BC Managed Care – PPO

## 2016-04-18 ENCOUNTER — Ambulatory Visit (HOSPITAL_BASED_OUTPATIENT_CLINIC_OR_DEPARTMENT_OTHER): Payer: BC Managed Care – PPO

## 2016-04-18 ENCOUNTER — Ambulatory Visit: Payer: BC Managed Care – PPO

## 2016-04-18 ENCOUNTER — Telehealth: Payer: Self-pay

## 2016-04-18 VITALS — BP 132/69 | HR 57 | Temp 98.1°F | Resp 18

## 2016-04-18 DIAGNOSIS — N390 Urinary tract infection, site not specified: Secondary | ICD-10-CM

## 2016-04-18 DIAGNOSIS — Z5112 Encounter for antineoplastic immunotherapy: Secondary | ICD-10-CM | POA: Diagnosis not present

## 2016-04-18 DIAGNOSIS — C711 Malignant neoplasm of frontal lobe: Secondary | ICD-10-CM | POA: Diagnosis not present

## 2016-04-18 DIAGNOSIS — C712 Malignant neoplasm of temporal lobe: Secondary | ICD-10-CM

## 2016-04-18 DIAGNOSIS — C719 Malignant neoplasm of brain, unspecified: Secondary | ICD-10-CM

## 2016-04-18 DIAGNOSIS — D496 Neoplasm of unspecified behavior of brain: Secondary | ICD-10-CM

## 2016-04-18 LAB — URINALYSIS, MICROSCOPIC - CHCC
BILIRUBIN (URINE): NEGATIVE
Blood: NEGATIVE
GLUCOSE UR CHCC: NEGATIVE mg/dL
Ketones: NEGATIVE mg/dL
LEUKOCYTE ESTERASE: NEGATIVE
NITRITE: NEGATIVE
PH: 5 (ref 4.6–8.0)
RBC / HPF: NEGATIVE (ref 0–2)
Specific Gravity, Urine: 1.025 (ref 1.003–1.035)
Urobilinogen, UR: 0.2 mg/dL (ref 0.2–1)
WBC UA: NEGATIVE (ref 0–2)

## 2016-04-18 LAB — CBC WITH DIFFERENTIAL/PLATELET
BASO%: 0.2 % (ref 0.0–2.0)
BASOS ABS: 0 10*3/uL (ref 0.0–0.1)
EOS ABS: 0 10*3/uL (ref 0.0–0.5)
EOS%: 0.2 % (ref 0.0–7.0)
HCT: 41.4 % (ref 34.8–46.6)
HGB: 13.9 g/dL (ref 11.6–15.9)
LYMPH%: 11 % — AB (ref 14.0–49.7)
MCH: 32.5 pg (ref 25.1–34.0)
MCHC: 33.6 g/dL (ref 31.5–36.0)
MCV: 96.5 fL (ref 79.5–101.0)
MONO#: 0.6 10*3/uL (ref 0.1–0.9)
MONO%: 5.1 % (ref 0.0–14.0)
NEUT%: 83.5 % — AB (ref 38.4–76.8)
NEUTROS ABS: 9.1 10*3/uL — AB (ref 1.5–6.5)
PLATELETS: 221 10*3/uL (ref 145–400)
RBC: 4.29 10*6/uL (ref 3.70–5.45)
RDW: 13.1 % (ref 11.2–14.5)
WBC: 10.9 10*3/uL — AB (ref 3.9–10.3)
lymph#: 1.2 10*3/uL (ref 0.9–3.3)

## 2016-04-18 LAB — UA PROTEIN, DIPSTICK - CHCC: Protein, ur: <30 mg/dL

## 2016-04-18 LAB — COMPREHENSIVE METABOLIC PANEL
ALK PHOS: 52 U/L (ref 40–150)
ALT: 20 U/L (ref 0–55)
ANION GAP: 10 meq/L (ref 3–11)
AST: 16 U/L (ref 5–34)
Albumin: 3.2 g/dL — ABNORMAL LOW (ref 3.5–5.0)
BILIRUBIN TOTAL: 0.42 mg/dL (ref 0.20–1.20)
BUN: 19.4 mg/dL (ref 7.0–26.0)
CO2: 23 meq/L (ref 22–29)
Calcium: 9.2 mg/dL (ref 8.4–10.4)
Chloride: 108 mEq/L (ref 98–109)
Creatinine: 0.9 mg/dL (ref 0.6–1.1)
EGFR: 75 mL/min/{1.73_m2} — AB (ref >90)
Glucose: 194 mg/dl — ABNORMAL HIGH (ref 70–140)
Potassium: 3.7 mEq/L (ref 3.5–5.1)
Sodium: 141 mEq/L (ref 136–145)
TOTAL PROTEIN: 6 g/dL — AB (ref 6.4–8.3)

## 2016-04-18 MED ORDER — SODIUM CHLORIDE 0.9 % IJ SOLN
10.0000 mL | INTRAMUSCULAR | Status: DC | PRN
  Administered 2016-04-18: 10 mL via INTRAVENOUS
  Filled 2016-04-18: qty 10

## 2016-04-18 MED ORDER — BEVACIZUMAB CHEMO INJECTION 400 MG/16ML
10.0000 mg/kg | Freq: Once | INTRAVENOUS | Status: AC
  Administered 2016-04-18: 750 mg via INTRAVENOUS
  Filled 2016-04-18: qty 30

## 2016-04-18 MED ORDER — SODIUM CHLORIDE 0.9 % IV SOLN
Freq: Once | INTRAVENOUS | Status: AC
  Administered 2016-04-18: 15:00:00 via INTRAVENOUS

## 2016-04-18 MED ORDER — SODIUM CHLORIDE 0.9% FLUSH
10.0000 mL | INTRAVENOUS | Status: DC | PRN
  Administered 2016-04-18: 10 mL
  Filled 2016-04-18: qty 10

## 2016-04-18 MED ORDER — HEPARIN SOD (PORK) LOCK FLUSH 100 UNIT/ML IV SOLN
500.0000 [IU] | Freq: Once | INTRAVENOUS | Status: AC | PRN
  Administered 2016-04-18: 500 [IU]
  Filled 2016-04-18: qty 5

## 2016-04-18 NOTE — Telephone Encounter (Signed)
Received [facility] fax which stated pt was inquiring about time of appointments today.  Called pt back to discuss.  Reviewed all appointment dates/times with pt who verbalized understanding.  No further questions at time of call.

## 2016-04-18 NOTE — Patient Instructions (Signed)

## 2016-04-18 NOTE — Progress Notes (Signed)
Pt here for chemo.  VSS.  Stated her urine has foul odor for about a month now,  Color normal yellow; denied blood in urine, denied painful voiding, just very smelly.   Dr. Lindi Adie notified.  Orders received for UA and C&S today.   Explanations given to pt and caregiver. Both voiced understanding.

## 2016-04-18 NOTE — Patient Instructions (Signed)
[facility] Discharge Instructions for Patients Receiving Chemotherapy  Today you received the following chemotherapy agents Avastin To help prevent nausea and vomiting after your treatment, we encourage you to take your nausea medication as prescribed.   If you develop nausea and vomiting that is not controlled by your nausea medication, call the clinic.   BELOW ARE SYMPTOMS THAT SHOULD BE REPORTED IMMEDIATELY:  *FEVER GREATER THAN 100.5 F  *CHILLS WITH OR WITHOUT FEVER  NAUSEA AND VOMITING THAT IS NOT CONTROLLED WITH YOUR NAUSEA MEDICATION  *UNUSUAL SHORTNESS OF BREATH  *UNUSUAL BRUISING OR BLEEDING  TENDERNESS IN MOUTH AND THROAT WITH OR WITHOUT PRESENCE OF ULCERS  *URINARY PROBLEMS  *BOWEL PROBLEMS  UNUSUAL RASH Items with * indicate a potential emergency and should be followed up as soon as possible.  Feel free to call the clinic you have any questions or concerns. The clinic phone number is [phone].  Please show the CHEMO ALERT CARD at check-in to the Emergency Department and triage nurse.   

## 2016-04-19 LAB — URINE CULTURE

## 2016-05-02 ENCOUNTER — Other Ambulatory Visit (HOSPITAL_BASED_OUTPATIENT_CLINIC_OR_DEPARTMENT_OTHER): Payer: BC Managed Care – PPO

## 2016-05-02 ENCOUNTER — Ambulatory Visit: Payer: BC Managed Care – PPO

## 2016-05-02 ENCOUNTER — Ambulatory Visit (HOSPITAL_COMMUNITY)
Admission: RE | Admit: 2016-05-02 | Discharge: 2016-05-02 | Disposition: A | Discharge status: Home / Self Care | Payer: BC Managed Care – PPO | Source: Ambulatory Visit | Attending: Hematology and Oncology | Admitting: Hematology and Oncology

## 2016-05-02 ENCOUNTER — Telehealth: Payer: Self-pay | Admitting: Hematology and Oncology

## 2016-05-02 ENCOUNTER — Ambulatory Visit (HOSPITAL_BASED_OUTPATIENT_CLINIC_OR_DEPARTMENT_OTHER): Payer: BC Managed Care – PPO

## 2016-05-02 ENCOUNTER — Ambulatory Visit (HOSPITAL_BASED_OUTPATIENT_CLINIC_OR_DEPARTMENT_OTHER): Payer: BC Managed Care – PPO | Admitting: Hematology and Oncology

## 2016-05-02 ENCOUNTER — Other Ambulatory Visit: Payer: Self-pay

## 2016-05-02 ENCOUNTER — Encounter: Payer: Self-pay | Admitting: Hematology and Oncology

## 2016-05-02 VITALS — BP 141/76 | HR 46

## 2016-05-02 DIAGNOSIS — C712 Malignant neoplasm of temporal lobe: Secondary | ICD-10-CM

## 2016-05-02 DIAGNOSIS — C711 Malignant neoplasm of frontal lobe: Secondary | ICD-10-CM

## 2016-05-02 DIAGNOSIS — Z5112 Encounter for antineoplastic immunotherapy: Secondary | ICD-10-CM

## 2016-05-02 DIAGNOSIS — M1288 Other specific arthropathies, not elsewhere classified, other specified site: Secondary | ICD-10-CM | POA: Diagnosis not present

## 2016-05-02 DIAGNOSIS — C719 Malignant neoplasm of brain, unspecified: Secondary | ICD-10-CM

## 2016-05-02 DIAGNOSIS — M4317 Spondylolisthesis, lumbosacral region: Secondary | ICD-10-CM | POA: Diagnosis not present

## 2016-05-02 DIAGNOSIS — D496 Neoplasm of unspecified behavior of brain: Secondary | ICD-10-CM

## 2016-05-02 LAB — COMPREHENSIVE METABOLIC PANEL
ALT: 21 U/L (ref 0–55)
AST: 20 U/L (ref 5–34)
Albumin: 3 g/dL — ABNORMAL LOW (ref 3.5–5.0)
Alkaline Phosphatase: 62 U/L (ref 40–150)
Anion Gap: 11 mEq/L (ref 3–11)
BILIRUBIN TOTAL: 0.39 mg/dL (ref 0.20–1.20)
BUN: 14.6 mg/dL (ref 7.0–26.0)
CHLORIDE: 109 meq/L (ref 98–109)
CO2: 21 meq/L — AB (ref 22–29)
Calcium: 8.8 mg/dL (ref 8.4–10.4)
Creatinine: 0.9 mg/dL (ref 0.6–1.1)
EGFR: 68 mL/min/{1.73_m2} — AB (ref >90)
GLUCOSE: 188 mg/dL — AB (ref 70–140)
POTASSIUM: 3.6 meq/L (ref 3.5–5.1)
SODIUM: 141 meq/L (ref 136–145)
TOTAL PROTEIN: 5.8 g/dL — AB (ref 6.4–8.3)

## 2016-05-02 LAB — CBC WITH DIFFERENTIAL/PLATELET
BASO%: 0.1 % (ref 0.0–2.0)
BASOS ABS: 0 10*3/uL (ref 0.0–0.1)
EOS%: 0.4 % (ref 0.0–7.0)
Eosinophils Absolute: 0 10*3/uL (ref 0.0–0.5)
HCT: 39.5 % (ref 34.8–46.6)
HGB: 13.7 g/dL (ref 11.6–15.9)
LYMPH%: 15.6 % (ref 14.0–49.7)
MCH: 32.9 pg (ref 25.1–34.0)
MCHC: 34.7 g/dL (ref 31.5–36.0)
MCV: 94.7 fL (ref 79.5–101.0)
MONO#: 0.6 10*3/uL (ref 0.1–0.9)
MONO%: 7.1 % (ref 0.0–14.0)
NEUT#: 7 10*3/uL — ABNORMAL HIGH (ref 1.5–6.5)
NEUT%: 76.8 % (ref 38.4–76.8)
NRBC: 0 % (ref 0–0)
Platelets: 215 10*3/uL (ref 145–400)
RBC: 4.17 10*6/uL (ref 3.70–5.45)
RDW: 12.8 % (ref 11.2–14.5)
WBC: 9.1 10*3/uL (ref 3.9–10.3)
lymph#: 1.4 10*3/uL (ref 0.9–3.3)

## 2016-05-02 MED ORDER — SODIUM CHLORIDE 0.9 % IV SOLN
10.0000 mg/kg | Freq: Once | INTRAVENOUS | Status: AC
  Administered 2016-05-02: 750 mg via INTRAVENOUS
  Filled 2016-05-02: qty 30

## 2016-05-02 MED ORDER — HEPARIN SOD (PORK) LOCK FLUSH 100 UNIT/ML IV SOLN
500.0000 [IU] | Freq: Once | INTRAVENOUS | Status: AC | PRN
  Administered 2016-05-02: 500 [IU]
  Filled 2016-05-02: qty 5

## 2016-05-02 MED ORDER — SODIUM CHLORIDE 0.9% FLUSH
10.0000 mL | INTRAVENOUS | Status: DC | PRN
  Administered 2016-05-02: 10 mL
  Filled 2016-05-02: qty 10

## 2016-05-02 MED ORDER — SODIUM CHLORIDE 0.9 % IJ SOLN
10.0000 mL | INTRAMUSCULAR | Status: DC | PRN
  Administered 2016-05-02: 10 mL via INTRAVENOUS
  Filled 2016-05-02: qty 10

## 2016-05-02 MED ORDER — SODIUM CHLORIDE 0.9 % IV SOLN
Freq: Once | INTRAVENOUS | Status: AC
  Administered 2016-05-02: 12:00:00 via INTRAVENOUS

## 2016-05-02 NOTE — Patient Instructions (Signed)

## 2016-05-02 NOTE — Telephone Encounter (Signed)
appt made and avs printed °

## 2016-05-02 NOTE — Patient Instructions (Signed)
[facility] Discharge Instructions for Patients Receiving Chemotherapy  Today you received the following chemotherapy agents Avastin To help prevent nausea and vomiting after your treatment, we encourage you to take your nausea medication as prescribed.   If you develop nausea and vomiting that is not controlled by your nausea medication, call the clinic.   BELOW ARE SYMPTOMS THAT SHOULD BE REPORTED IMMEDIATELY:  *FEVER GREATER THAN 100.5 F  *CHILLS WITH OR WITHOUT FEVER  NAUSEA AND VOMITING THAT IS NOT CONTROLLED WITH YOUR NAUSEA MEDICATION  *UNUSUAL SHORTNESS OF BREATH  *UNUSUAL BRUISING OR BLEEDING  TENDERNESS IN MOUTH AND THROAT WITH OR WITHOUT PRESENCE OF ULCERS  *URINARY PROBLEMS  *BOWEL PROBLEMS  UNUSUAL RASH Items with * indicate a potential emergency and should be followed up as soon as possible.  Feel free to call the clinic you have any questions or concerns. The clinic phone number is [phone].  Please show the CHEMO ALERT CARD at check-in to the Emergency Department and triage nurse.   

## 2016-05-02 NOTE — Assessment & Plan Note (Signed)
Brain MRI 07/24/2015: Heterogeneous enhancing right frontal lobe mass 4.7 x 3.6 x 3.3 cm with vasogenic edema, heterogeneous enhancing mid right temporal lobe mass 4.1 x 3.1 x 2.2 cm with edema and right-to-left shift and mass effect. 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 and MGMT Methylation: Not Detected) Suggests Poorer response to chemo-XRT and Poor prognosis ----------------------------------------------------------------------------------------------------------------------------------------------------------- Treatment summary:Concurrent chemoradiation with daily temozolomide at 75 mg/m (180 mg dose) started 08/29/2015 completed 10/11/2015 MRI brain 10/23/2015: Enhancement right temporal lobe cavity 4.3 x 3.7 cm previously 3.3 cm, second right frontal lobe nodule 3 x 3.3 cm, previously 2.9 cm, right mesial frontal lobe nodule 11 mm.  Treatment plan: Delay in starting treatment because patient missed appointments for port placement  Avastin 10 mg/kg every 2 weeks starting at 12/14/2015, today is cycle 4  Avastin toxicities:  1. Petechial rash 2. Bruising on the arms and thighs 3. Palpitations   MRI of the brain 03/25/2016: Slight worsening of Right frontal posterior perilesional enhancement. No definite progression Short-term follow-up MRI is being planned. Options discussed including adding temozolomide to Avastin.  Return to clinic Q 4 weeks and every 2 weeks for Avastin

## 2016-05-02 NOTE — Progress Notes (Signed)
Patient Care Team: Carol Ada, MD as PCP - General (Family Medicine)  DIAGNOSIS: No matching staging information was found for the patient.  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      12/14/2015 -  Chemotherapy    Avastin 10 mg/kg every 2 weeks       CHIEF COMPLIANT: Follow-up on Avastin therapy  INTERVAL HISTORY: Michelle Duran is a 60 year old with above-mentioned history of relapsed GBM currently on Avastin therapy. Avastin has been causing bruises on her arms. There is a sore on each of her arms but is not healing. This especially happens when she bumps on to something. She does not have any significant bleeding. She does complain of some soreness around the surgery site.  REVIEW OF SYSTEMS:   Constitutional: Denies fevers, chills or abnormal weight loss Eyes: Denies blurriness of vision Ears, nose, mouth, throat, and face: Denies mucositis or sore throat Respiratory: Denies cough, dyspnea or wheezes Cardiovascular: Denies palpitation, chest discomfort Gastrointestinal:  Denies nausea, heartburn or change in bowel habits Skin: Denies abnormal skin rashes Lymphatics: Denies new lymphadenopathy or easy bruising Neurological:Denies numbness,  tingling or new weaknesses Behavioral/Psych: Mood is stable, no new changes  Extremities: No lower extremity edema All other systems were reviewed with the patient and are negative.  I have reviewed the past medical history, past surgical history, social history and family history with the patient and they are unchanged from previous note.  ALLERGIES:  is allergic to codeine; prednisone; and sulfonamide derivatives.  MEDICATIONS:  Current Outpatient Prescriptions  Medication Sig Dispense Refill  . ACCU-CHEK AVIVA PLUS test strip See admin instructions.  1  . acetaminophen (TYLENOL) 650 MG CR tablet Take 1,300 mg by mouth every 8 (eight) hours as needed for pain. Reported on 11/09/2015    . B-D ULTRAFINE III SHORT PEN 31G X 8 MM MISC     . clonazePAM (KLONOPIN) 0.5 MG disintegrating tablet DISSOLVE 1 TABLET IN MOUTH EVERY 12 HOURS AS NEEDED SEIZURE  0  . dexamethasone (DECADRON) 4 MG tablet Take 1 tablet (4 mg total) by mouth daily. 30 tablet 1  . Insulin Glargine (LANTUS) 100 UNIT/ML Solostar Pen Inject 8 Units into the skin daily at 10 pm. 15 mL 11  . levETIRAcetam (KEPPRA) 500 MG tablet Take 3 tablets (1,500 mg total) by mouth 2 (two) times daily. 180 tablet 5  . LORazepam (ATIVAN) 0.5 MG tablet TAKKE 1 TABLET BY MOUTH EVERY 4-6 HOURS AS NEEDED FOR NAUSEA 30 tablet 0  . pantoprazole (PROTONIX) 40 MG tablet Take 1 tablet (40 mg total) by mouth 2 (two) times daily. 60 tablet 1  . sertraline (ZOLOFT) 100 MG tablet Take 150 mg  by mouth at bedtime. Reported on 11/09/2015    . SYNTHROID 25 MCG tablet Take 12.5 mcg by mouth daily before breakfast.   0   No current facility-administered medications for this visit.    Facility-Administered Medications Ordered in Other Visits  Medication Dose Route Frequency Provider Last Rate Last Dose  . bevacizumab (AVASTIN) 750 mg in sodium chloride 0.9 % 100 mL chemo infusion  10 mg/kg (Treatment Plan Recorded) Intravenous Once Nicholas Lose, MD 390 mL/hr at  05/02/16 1244 750 mg at 05/02/16 1244  . heparin lock flush 100 unit/mL  500 Units Intracatheter Once PRN Nicholas Lose, MD      . sodium chloride flush (NS) 0.9 % injection 10 mL  10 mL Intracatheter PRN Nicholas Lose, MD      . Rennis Chris emulsion 1 application  1 application Topical BID Tyler Pita, MD   1 application at 77/82/42 1517    PHYSICAL EXAMINATION: ECOG PERFORMANCE STATUS: 1 - Symptomatic but completely ambulatory  Vitals:   05/02/16 1059  BP: 133/81  Pulse: (!) 52  Resp: 18  Temp: 98.6 F (37 C)   Filed Weights   05/02/16 1059  Weight: 182 lb 12.8 oz (82.9 kg)    GENERAL:alert, no distress and comfortable SKIN: skin color, texture, turgor are normal, no rashes or significant lesions EYES: normal, Conjunctiva are pink and non-injected, sclera clear OROPHARYNX:no exudate, no erythema and lips, buccal mucosa, and tongue normal  NECK: supple, thyroid normal size, non-tender, without nodularity LYMPH:  no palpable lymphadenopathy in the cervical, axillary or inguinal LUNGS: clear to auscultation and percussion with normal breathing effort HEART: regular rate & rhythm and no murmurs and no lower extremity edema ABDOMEN:abdomen soft, non-tender and normal bowel sounds MUSCULOSKELETAL:no cyanosis of digits and no clubbing  NEURO: alert & oriented x 3 with fluent speech, no focal motor/sensory deficits EXTREMITIES: No lower extremity edema  LABORATORY DATA:  I have reviewed the data as listed   Chemistry      Component Value Date/Time   NA 141 05/02/2016 1035   K 3.6 05/02/2016 1035   CL 108 02/08/2016 0300   CO2 21 (L) 05/02/2016 1035   BUN 14.6 05/02/2016 1035   CREATININE 0.9 05/02/2016 1035      Component Value Date/Time   CALCIUM 8.8 05/02/2016 1035   ALKPHOS 62 05/02/2016 1035   AST 20 05/02/2016 1035   ALT 21 05/02/2016 1035   BILITOT 0.39 05/02/2016 1035       Lab Results  Component Value Date   WBC 9.1 05/02/2016   HGB 13.7 05/02/2016   HCT  39.5 05/02/2016   MCV 94.7 05/02/2016   PLT 215 05/02/2016   NEUTROABS 7.0 (H) 05/02/2016     ASSESSMENT & PLAN:  Glioblastoma multiforme of brain ([place]) Brain MRI 07/24/2015: Heterogeneous enhancing right frontal lobe mass 4.7 x 3.6 x 3.3 cm with vasogenic edema, heterogeneous enhancing mid right temporal lobe mass 4.1 x 3.1 x 2.2 cm with edema and right-to-left shift and mass effect. 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 and MGMT Methylation: Not Detected) Suggests Poorer response to chemo-XRT and Poor prognosis ----------------------------------------------------------------------------------------------------------------------------------------------------------- Treatment summary:Concurrent chemoradiation with daily temozolomide at 75 mg/m (180 mg dose) started 08/29/2015 completed 10/11/2015 MRI brain 10/23/2015: Enhancement right temporal lobe cavity 4.3 x 3.7 cm previously 3.3 cm, second right frontal lobe nodule 3 x 3.3 cm, previously 2.9 cm, right mesial frontal lobe nodule  11 mm.  Treatment plan: Delay in starting treatment because patient missed appointments for port placement  Avastin 10 mg/kg every 2 weeks starting at 12/14/2015, today is cycle 4  Avastin toxicities:  1. Petechial rash 2. Bruising on the arms and thighs 3. Palpitations   MRI of the brain 03/25/2016: Slight worsening of Right frontal posterior perilesional enhancement. No definite progression Short-term follow-up MRI is being planned for the end of September. Options discussed including adding temozolomide to Avastin.  Return to clinic Q 4 weeks and every 2 weeks for Avastin    Orders Placed This Encounter  Procedures  . DG Lumbar Spine 2-3 Views    Standing Status:   Future    Standing Expiration Date:   05/02/2017    Order Specific Question:   Reason for Exam (SYMPTOM  OR DIAGNOSIS  REQUIRED)    Answer:   back pain and right hip pain with brain cancer    Order Specific Question:   Is patient pregnant?    Answer:   No    Order Specific Question:   Preferred imaging location?    Answer:   [facility]  . DG Hip Unilat W or W/O Pelvis 1 View Right    Standing Status:   Future    Standing Expiration Date:   05/02/2017    Order Specific Question:   Reason for Exam (SYMPTOM  OR DIAGNOSIS REQUIRED)    Answer:   back pain and right hip pain with brain cancer    Order Specific Question:   Is patient pregnant?    Answer:   No    Order Specific Question:   Preferred imaging location?    Answer:   [facility]  . MR Brain W Wo Contrast    Standing Status:   Future    Standing Expiration Date:   06/06/2017    Order Specific Question:   Reason for exam:    Answer:   GBM relapsed    Order Specific Question:   Preferred imaging location?    Answer:   [facility] (table limit-350 lbs)    Order Specific Question:   Does the patient have a pacemaker or implanted devices?    Answer:   No   The patient has a good understanding of the overall plan. she agrees with it. she will call with any problems that may develop before the next visit here.   Rulon Eisenmenger, MD 05/02/16

## 2016-05-10 ENCOUNTER — Encounter: Payer: Self-pay | Admitting: *Deleted

## 2016-05-10 NOTE — Progress Notes (Signed)
[place] Work  Clinical Social Work was referred by Development worker, community for assessment of psychosocial needs.  Patient contacted Clinical Social Worker by phone and left message that she needed help with utility bill. CSW attempted to contact patient back at home to offer support and assess for needs. CSW left message that there are a few options for possible assistance with utility bill, but that pt would need to bring in bill to apply for assistance through these resources. CSW encouraged pt to return call and/or come to [facility] and follow up with CSW in person.  Pt could possibly get assistance through [employer] or [employer].    Clinical Social Work interventions: Resource education  Loren Racer, [place] Worker [place]  [place] Phone: [phone] Fax: [phone]

## 2016-05-12 ENCOUNTER — Other Ambulatory Visit: Payer: Self-pay | Admitting: Hematology and Oncology

## 2016-05-16 ENCOUNTER — Ambulatory Visit: Payer: Self-pay

## 2016-05-16 ENCOUNTER — Other Ambulatory Visit: Payer: Self-pay

## 2016-05-17 ENCOUNTER — Other Ambulatory Visit: Payer: Self-pay

## 2016-05-20 ENCOUNTER — Telehealth: Payer: Self-pay | Admitting: Hematology and Oncology

## 2016-05-20 ENCOUNTER — Other Ambulatory Visit: Payer: Self-pay | Admitting: *Deleted

## 2016-05-20 ENCOUNTER — Ambulatory Visit (HOSPITAL_COMMUNITY)
Admission: RE | Admit: 2016-05-20 | Discharge: 2016-05-20 | Disposition: A | Discharge status: Home / Self Care | Payer: BC Managed Care – PPO | Source: Ambulatory Visit | Attending: Hematology and Oncology | Admitting: Hematology and Oncology

## 2016-05-20 DIAGNOSIS — C719 Malignant neoplasm of brain, unspecified: Secondary | ICD-10-CM | POA: Diagnosis present

## 2016-05-20 MED ORDER — GADOBENATE DIMEGLUMINE 529 MG/ML IV SOLN
15.0000 mL | Freq: Once | INTRAVENOUS | Status: AC | PRN
Start: 2016-05-20 — End: 2016-05-20
  Administered 2016-05-20: 15 mL via INTRAVENOUS

## 2016-05-20 NOTE — Telephone Encounter (Signed)
lvm to inform pt of 9/20 appt per pof

## 2016-05-22 ENCOUNTER — Ambulatory Visit: Payer: BC Managed Care – PPO

## 2016-05-22 ENCOUNTER — Encounter: Payer: Self-pay | Admitting: *Deleted

## 2016-05-22 ENCOUNTER — Other Ambulatory Visit (HOSPITAL_BASED_OUTPATIENT_CLINIC_OR_DEPARTMENT_OTHER): Payer: BC Managed Care – PPO

## 2016-05-22 ENCOUNTER — Ambulatory Visit (HOSPITAL_BASED_OUTPATIENT_CLINIC_OR_DEPARTMENT_OTHER): Payer: BC Managed Care – PPO

## 2016-05-22 VITALS — BP 117/72 | HR 56 | Temp 98.2°F | Resp 16

## 2016-05-22 DIAGNOSIS — Z5112 Encounter for antineoplastic immunotherapy: Secondary | ICD-10-CM | POA: Diagnosis not present

## 2016-05-22 DIAGNOSIS — C711 Malignant neoplasm of frontal lobe: Secondary | ICD-10-CM

## 2016-05-22 DIAGNOSIS — C719 Malignant neoplasm of brain, unspecified: Secondary | ICD-10-CM

## 2016-05-22 DIAGNOSIS — C712 Malignant neoplasm of temporal lobe: Secondary | ICD-10-CM | POA: Diagnosis not present

## 2016-05-22 DIAGNOSIS — D496 Neoplasm of unspecified behavior of brain: Secondary | ICD-10-CM

## 2016-05-22 LAB — CBC WITH DIFFERENTIAL/PLATELET
BASO%: 0.1 % (ref 0.0–2.0)
BASOS ABS: 0 10*3/uL (ref 0.0–0.1)
EOS ABS: 0 10*3/uL (ref 0.0–0.5)
EOS%: 0.1 % (ref 0.0–7.0)
HEMATOCRIT: 42.4 % (ref 34.8–46.6)
HEMOGLOBIN: 14.5 g/dL (ref 11.6–15.9)
LYMPH#: 0.9 10*3/uL (ref 0.9–3.3)
LYMPH%: 8.5 % — ABNORMAL LOW (ref 14.0–49.7)
MCH: 32.8 pg (ref 25.1–34.0)
MCHC: 34.2 g/dL (ref 31.5–36.0)
MCV: 95.9 fL (ref 79.5–101.0)
MONO#: 0.4 10*3/uL (ref 0.1–0.9)
MONO%: 4.2 % (ref 0.0–14.0)
NEUT#: 9.2 10*3/uL — ABNORMAL HIGH (ref 1.5–6.5)
NEUT%: 87.1 % — AB (ref 38.4–76.8)
PLATELETS: 211 10*3/uL (ref 145–400)
RBC: 4.42 10*6/uL (ref 3.70–5.45)
RDW: 13 % (ref 11.2–14.5)
WBC: 10.6 10*3/uL — ABNORMAL HIGH (ref 3.9–10.3)

## 2016-05-22 LAB — COMPREHENSIVE METABOLIC PANEL
ALBUMIN: 3.2 g/dL — AB (ref 3.5–5.0)
ALK PHOS: 69 U/L (ref 40–150)
ALT: 21 U/L (ref 0–55)
ANION GAP: 12 meq/L — AB (ref 3–11)
AST: 13 U/L (ref 5–34)
BUN: 18.8 mg/dL (ref 7.0–26.0)
CALCIUM: 9.1 mg/dL (ref 8.4–10.4)
CHLORIDE: 108 meq/L (ref 98–109)
CO2: 22 mEq/L (ref 22–29)
Creatinine: 1.1 mg/dL (ref 0.6–1.1)
EGFR: 57 mL/min/{1.73_m2} — AB (ref >90)
Glucose: 224 mg/dl — ABNORMAL HIGH (ref 70–140)
POTASSIUM: 3.9 meq/L (ref 3.5–5.1)
Sodium: 142 mEq/L (ref 136–145)
Total Bilirubin: 0.45 mg/dL (ref 0.20–1.20)
Total Protein: 6.2 g/dL — ABNORMAL LOW (ref 6.4–8.3)

## 2016-05-22 MED ORDER — SODIUM CHLORIDE 0.9 % IV SOLN
10.0000 mg/kg | Freq: Once | INTRAVENOUS | Status: AC
  Administered 2016-05-22: 750 mg via INTRAVENOUS
  Filled 2016-05-22: qty 30

## 2016-05-22 MED ORDER — SODIUM CHLORIDE 0.9% FLUSH
10.0000 mL | INTRAVENOUS | Status: DC | PRN
  Administered 2016-05-22: 10 mL
  Filled 2016-05-22: qty 10

## 2016-05-22 MED ORDER — SODIUM CHLORIDE 0.9 % IJ SOLN
10.0000 mL | INTRAMUSCULAR | Status: DC | PRN
  Administered 2016-05-22: 10 mL via INTRAVENOUS
  Filled 2016-05-22: qty 10

## 2016-05-22 MED ORDER — HEPARIN SOD (PORK) LOCK FLUSH 100 UNIT/ML IV SOLN
500.0000 [IU] | Freq: Once | INTRAVENOUS | Status: AC | PRN
  Administered 2016-05-22: 500 [IU]
  Filled 2016-05-22: qty 5

## 2016-05-22 MED ORDER — SODIUM CHLORIDE 0.9 % IV SOLN
Freq: Once | INTRAVENOUS | Status: AC
  Administered 2016-05-22: 16:00:00 via INTRAVENOUS

## 2016-05-22 NOTE — Patient Instructions (Signed)
Bevacizumab injection What is this medicine? BEVACIZUMAB (be va SIZ yoo mab) is a monoclonal antibody. It is used to treat cervical cancer, colorectal cancer, glioblastoma multiforme, non-small cell lung cancer (NSCLC), ovarian cancer, and renal cell cancer. This medicine may be used for other purposes; ask your health care provider or pharmacist if you have questions. What should I tell my health care provider before I take this medicine? They need to know if you have any of these conditions: -blood clots -heart disease, including heart failure, heart attack, or chest pain (angina) -high blood pressure -infection (especially a virus infection such as chickenpox, cold sores, or herpes) -kidney disease -lung disease -prior chemotherapy with doxorubicin, daunorubicin, epirubicin, or other anthracycline type chemotherapy agents -recent or ongoing radiation therapy -recent surgery -stroke -an unusual or allergic reaction to bevacizumab, hamster proteins, mouse proteins, other medicines, foods, dyes, or preservatives -pregnant or trying to get pregnant -breast-feeding How should I use this medicine? This medicine is for infusion into a vein. It is given by a health care professional in a hospital or clinic setting. Talk to your pediatrician regarding the use of this medicine in children. Special care may be needed. Overdosage: If you think you have taken too much of this medicine contact a poison control center or emergency room at once. NOTE: This medicine is only for you. Do not share this medicine with others. What if I miss a dose? It is important not to miss your dose. Call your doctor or health care professional if you are unable to keep an appointment. What may interact with this medicine? Interactions are not expected. This list may not describe all possible interactions. Give your health care provider a list of all the medicines, herbs, non-prescription drugs, or dietary supplements  you use. Also tell them if you smoke, drink alcohol, or use illegal drugs. Some items may interact with your medicine. What should I watch for while using this medicine? Your condition will be monitored carefully while you are receiving this medicine. You will need important blood work and urine testing done while you are taking this medicine. During your treatment, let your health care professional know if you have any unusual symptoms, such as difficulty breathing. This medicine may rarely cause 'gastrointestinal perforation' (holes in the stomach, intestines or colon), a serious side effect requiring surgery to repair. This medicine should be started at least 28 days following major surgery and the site of the surgery should be totally healed. Check with your doctor before scheduling dental work or surgery while you are receiving this treatment. Talk to your doctor if you have recently had surgery or if you have a wound that has not healed. Do not become pregnant while taking this medicine or for 6 months after stopping it. Women should inform their doctor if they wish to become pregnant or think they might be pregnant. There is a potential for serious side effects to an unborn child. Talk to your health care professional or pharmacist for more information. Do not breast-feed an infant while taking this medicine. This medicine has caused ovarian failure in some women. This medicine may interfere with the ability to have a child. You should talk to your doctor or health care professional if you are concerned about your fertility. What side effects may I notice from receiving this medicine? Side effects that you should report to your doctor or health care professional as soon as possible: -allergic reactions like skin rash, itching or hives, swelling of the face,   lips, or tongue -signs of infection - fever or chills, cough, sore throat, pain or trouble passing urine -signs of decreased platelets or  bleeding - bruising, pinpoint red spots on the skin, black, tarry stools, nosebleeds, blood in the urine -breathing problems -changes in vision -chest pain -confusion -jaw pain, especially after dental work -mouth sores -seizures -severe abdominal pain -severe headache -sudden numbness or weakness of the face, arm or leg -swelling of legs or ankles -symptoms of a stroke: change in mental awareness, inability to talk or move one side of the body (especially in patients with lung cancer) -trouble passing urine or change in the amount of urine -trouble speaking or understanding -trouble walking, dizziness, loss of balance or coordination Side effects that usually do not require medical attention (report to your doctor or health care professional if they continue or are bothersome): -constipation -diarrhea -dry skin -headache -loss of appetite -nausea, vomiting This list may not describe all possible side effects. Call your doctor for medical advice about side effects. You may report side effects to FDA at 1-800-FDA-1088. Where should I keep my medicine? This drug is given in a hospital or clinic and will not be stored at home. NOTE: This sheet is a summary. It may not cover all possible information. If you have questions about this medicine, talk to your doctor, pharmacist, or health care provider.    2016, Elsevier/Gold Standard. (2014-10-18 16:58:44)  

## 2016-05-22 NOTE — Progress Notes (Signed)
[place] Work  Clinical Social Work was referred by patient for assessment of psychosocial needs due to financial and disability application concerns.  Clinical Social Worker met with patient at [facility] to offer support and assess for needs.  CSW submitted application to [employer] for assistance with pt's power bill. CSW also assisted pt in communicating with [facility] office on behalf of pt. Pt very appreciative, but appears to be in need of much assistance. CSW to follow.   Clinical Social Work interventions: Resource education and referral  Loren Racer, [place] Worker [place]  [place] Phone: [phone] Fax: [phone]

## 2016-05-22 NOTE — Progress Notes (Signed)
Pt stated that she was stuck several times during MRI appt yesterday 05/21/16. Port site was [employer] before drawing labs. There were no signs of swelling but slight redness. Pt stated there was some soreness but no major pain.

## 2016-05-23 ENCOUNTER — Telehealth: Payer: Self-pay | Admitting: *Deleted

## 2016-05-23 NOTE — Telephone Encounter (Signed)
Patient called and would like scan results from 05/20/16. Return number is [phone].

## 2016-05-24 NOTE — Telephone Encounter (Signed)
Per Dr. Lindi Adie, let pt know there was no sign of disease progression on her MRI.  Pt voiced understanding and appreciation

## 2016-05-30 ENCOUNTER — Inpatient Hospital Stay
Admission: RE | Admit: 2016-05-30 | Discharge: 2016-05-30 | Disposition: A | Discharge status: Home / Self Care | Payer: Self-pay | Source: Ambulatory Visit | Attending: Radiation Oncology | Admitting: Radiation Oncology

## 2016-05-30 ENCOUNTER — Other Ambulatory Visit: Payer: Self-pay

## 2016-05-30 ENCOUNTER — Telehealth: Payer: Self-pay | Admitting: Hematology and Oncology

## 2016-05-30 ENCOUNTER — Ambulatory Visit: Payer: Self-pay | Admitting: Hematology and Oncology

## 2016-05-30 ENCOUNTER — Ambulatory Visit: Payer: Self-pay

## 2016-05-30 NOTE — Assessment & Plan Note (Deleted)
Brain MRI 07/24/2015: Heterogeneous enhancing right frontal lobe mass 4.7 x 3.6 x 3.3 cm with vasogenic edema, heterogeneous enhancing mid right temporal lobe mass 4.1 x 3.1 x 2.2 cm with edema and right-to-left shift and mass effect. 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 and MGMT Methylation: Not Detected) Suggests Poorer response to chemo-XRT and Poor prognosis ----------------------------------------------------------------------------------------------------------------------------------------------------------- Treatment summary:Concurrent chemoradiation with daily temozolomide at 75 mg/m (180 mg dose) started 08/29/2015 completed 10/11/2015 ----------------------------------------------------------------------------------------------------------------------------------------------------------- Brain MRI 07/24/2015: Heterogeneous enhancing right frontal lobe mass 4.7 x 3.6 x 3.3 cm with vasogenic edema, heterogeneous enhancing mid right temporal lobe mass 4.1 x 3.1 x 2.2 cm with edema and right-to-left shift and mass effect. MRI brain 10/23/2015: Enhancement right temporal lobe cavity 4.3 x 3.7 cm previously 3.3 cm, second right frontal lobe nodule 3 x 3.3 cm, previously 2.9 cm, right mesial frontal lobe nodule 11 mm. MRI brain 03/25/2016: Slight worsening of Right frontal posterior perilesional enhancement. No definite progression Short-term follow-up MRI is being planned for the end of September. MRI brain 05/21/2016: Overall no significant interval change in the multifocal areas of enhancement within the right frontal and temporal regions as well as the parasagittal frontal lobes bilaterally compared to 03/25/2016 ----------------------------------------------------------------------------------------------------------------------------------------------------------- Treatment  summary: 1. 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 Concurrent chemoradiation with daily temozolomide at 75 mg/m (180 mg dose) started 08/29/2015 completed 10/11/2015  Current treatment: Avastin 10 mg/kg every 2 weeks starting at 12/14/2015, today is cycle 4  Avastin toxicities:  1. Petechial rash 2. Bruising on the arms and thighs 3. Palpitations  Options discussed including adding temozolomide to Avastin. Since the current MRI shows stable findings, I did not add temozolomide.  Return to clinic Q 4 weeks andevery 2 weeks for Avastin

## 2016-05-30 NOTE — Telephone Encounter (Signed)
sw pt to confirm 10/4 appt per LOS

## 2016-05-30 NOTE — Progress Notes (Signed)
Called pt to let her know she did not need to come in today as it was 1 week early for her Avastin treatment.  Pt verbalized understanding and scheduling message sent to [facility] for all appts to be scheduled for next week and to be adjusted appropriately moving forward.  Pt was scheduled to review MRI results with Dr. Tammi Klippel this afternoon; however, after speaking to [facility], RN I was able to cancel this appointment as well because results had already been reviewed with pt by Dr. Lindi Adie.  Per Aldona Bar, RN pt does not need to come in for appt today and does not need to reschedule as pt is already aware of information that was to be discussed today.  Pt verbalized understanding that she would hear from our scheduling department in regards to new times for next weeks appts.  No further questions or concerns at time of call.

## 2016-05-31 ENCOUNTER — Telehealth: Payer: Self-pay | Admitting: Radiation Oncology

## 2016-05-31 NOTE — Telephone Encounter (Signed)
Received call from Eulas Post, RN for Dr. Lindi Adie. She reports patient's appointment for infusion was moved to next week. She requested follow up appointment with Dr. Tammi Klippel be pushed out to next week to better accommodate the patient and prevent two trips to the cancer center. Explained the follow up appointment with Dr. Tammi Klippel was to review results of MRI but, Dr. Lindi Adie has already done so thus, appointment with Tammi Klippel is no longer needed. Eulas Post, RN to call patient and explain all appointments for today have been cancelled and Mont Dutton will contact her at a later today with future radiation appointments. Informed Mont Dutton, RT and Shona Simpson, PA-C of these findings.

## 2016-06-03 ENCOUNTER — Ambulatory Visit: Payer: Self-pay | Admitting: Radiation Oncology

## 2016-06-04 ENCOUNTER — Other Ambulatory Visit: Payer: Self-pay

## 2016-06-04 DIAGNOSIS — C719 Malignant neoplasm of brain, unspecified: Secondary | ICD-10-CM

## 2016-06-04 NOTE — Assessment & Plan Note (Signed)
Brain MRI 07/24/2015: Heterogeneous enhancing right frontal lobe mass 4.7 x 3.6 x 3.3 cm with vasogenic edema, heterogeneous enhancing mid right temporal lobe mass 4.1 x 3.1 x 2.2 cm with edema and right-to-left shift and mass effect. 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 and MGMT Methylation: Not Detected) Suggests Poorer response to chemo-XRT and Poor prognosis ----------------------------------------------------------------------------------------------------------------------------------------------------------- Treatment summary:Concurrent chemoradiation with daily temozolomide at 75 mg/m (180 mg dose) started 08/29/2015 completed 10/11/2015 ----------------------------------------------------------------------------------------------------------------------------------------------------------- Brain MRI 07/24/2015: Heterogeneous enhancing right frontal lobe mass 4.7 x 3.6 x 3.3 cm with vasogenic edema, heterogeneous enhancing mid right temporal lobe mass 4.1 x 3.1 x 2.2 cm with edema and right-to-left shift and mass effect. MRI brain 10/23/2015: Enhancement right temporal lobe cavity 4.3 x 3.7 cm previously 3.3 cm, second right frontal lobe nodule 3 x 3.3 cm, previously 2.9 cm, right mesial frontal lobe nodule 11 mm. MRI brain 03/25/2016: Slight worsening of Right frontal posterior perilesional enhancement. No definite progression Short-term follow-up MRI is being planned for the end of September. MRI brain 05/21/2016: Overall no significant interval change in the multifocal areas of enhancement within the right frontal and temporal regions as well as the parasagittal frontal lobes bilaterally compared to 03/25/2016 ----------------------------------------------------------------------------------------------------------------------------------------------------------- Treatment  summary: 1. 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 Concurrent chemoradiation with daily temozolomide at 75 mg/m (180 mg dose) started 08/29/2015 completed 10/11/2015  Current treatment: Avastin 10 mg/kg every 2 weeks starting at 12/14/2015, today is cycle 4  Avastin toxicities:  1. Petechial rash 2. Bruising on the arms and thighs 3. Palpitations  Options discussed including adding temozolomide to Avastin. Since the current MRI shows stable findings, I did not add temozolomide.  Return to clinic Q 4 weeks andevery 2 weeks for Avastin

## 2016-06-05 ENCOUNTER — Other Ambulatory Visit (HOSPITAL_BASED_OUTPATIENT_CLINIC_OR_DEPARTMENT_OTHER): Payer: BC Managed Care – PPO

## 2016-06-05 ENCOUNTER — Ambulatory Visit (HOSPITAL_BASED_OUTPATIENT_CLINIC_OR_DEPARTMENT_OTHER): Payer: BC Managed Care – PPO | Admitting: Hematology and Oncology

## 2016-06-05 ENCOUNTER — Ambulatory Visit: Payer: BC Managed Care – PPO

## 2016-06-05 ENCOUNTER — Encounter: Payer: Self-pay | Admitting: Hematology and Oncology

## 2016-06-05 ENCOUNTER — Ambulatory Visit (HOSPITAL_BASED_OUTPATIENT_CLINIC_OR_DEPARTMENT_OTHER): Payer: BC Managed Care – PPO

## 2016-06-05 VITALS — BP 141/70 | HR 59 | Temp 97.6°F | Resp 18

## 2016-06-05 DIAGNOSIS — C711 Malignant neoplasm of frontal lobe: Secondary | ICD-10-CM | POA: Diagnosis not present

## 2016-06-05 DIAGNOSIS — C719 Malignant neoplasm of brain, unspecified: Secondary | ICD-10-CM

## 2016-06-05 DIAGNOSIS — C712 Malignant neoplasm of temporal lobe: Secondary | ICD-10-CM

## 2016-06-05 DIAGNOSIS — Z5112 Encounter for antineoplastic immunotherapy: Secondary | ICD-10-CM

## 2016-06-05 DIAGNOSIS — D496 Neoplasm of unspecified behavior of brain: Secondary | ICD-10-CM

## 2016-06-05 LAB — CBC WITH DIFFERENTIAL/PLATELET
BASO%: 0.1 % (ref 0.0–2.0)
BASOS ABS: 0 10*3/uL (ref 0.0–0.1)
EOS ABS: 0 10*3/uL (ref 0.0–0.5)
EOS%: 0.4 % (ref 0.0–7.0)
HCT: 39.6 % (ref 34.8–46.6)
HEMOGLOBIN: 13.5 g/dL (ref 11.6–15.9)
LYMPH%: 20.6 % (ref 14.0–49.7)
MCH: 32.5 pg (ref 25.1–34.0)
MCHC: 34.1 g/dL (ref 31.5–36.0)
MCV: 95.4 fL (ref 79.5–101.0)
MONO#: 0.7 10*3/uL (ref 0.1–0.9)
MONO%: 7.3 % (ref 0.0–14.0)
NEUT#: 7 10*3/uL — ABNORMAL HIGH (ref 1.5–6.5)
NEUT%: 71.6 % (ref 38.4–76.8)
Platelets: 197 10*3/uL (ref 145–400)
RBC: 4.15 10*6/uL (ref 3.70–5.45)
RDW: 12.9 % (ref 11.2–14.5)
WBC: 9.8 10*3/uL (ref 3.9–10.3)
lymph#: 2 10*3/uL (ref 0.9–3.3)

## 2016-06-05 LAB — COMPREHENSIVE METABOLIC PANEL
ALBUMIN: 3 g/dL — AB (ref 3.5–5.0)
ALK PHOS: 64 U/L (ref 40–150)
ALT: 21 U/L (ref 0–55)
AST: 15 U/L (ref 5–34)
Anion Gap: 10 mEq/L (ref 3–11)
BUN: 14.3 mg/dL (ref 7.0–26.0)
CALCIUM: 9.1 mg/dL (ref 8.4–10.4)
CO2: 23 mEq/L (ref 22–29)
Chloride: 108 mEq/L (ref 98–109)
Creatinine: 0.9 mg/dL (ref 0.6–1.1)
EGFR: 68 mL/min/{1.73_m2} — ABNORMAL LOW (ref >90)
GLUCOSE: 161 mg/dL — AB (ref 70–140)
POTASSIUM: 3.6 meq/L (ref 3.5–5.1)
SODIUM: 142 meq/L (ref 136–145)
Total Bilirubin: 0.45 mg/dL (ref 0.20–1.20)
Total Protein: 5.7 g/dL — ABNORMAL LOW (ref 6.4–8.3)

## 2016-06-05 LAB — UA PROTEIN, DIPSTICK - CHCC: PROTEIN: 30 mg/dL

## 2016-06-05 MED ORDER — SODIUM CHLORIDE 0.9 % IJ SOLN
10.0000 mL | INTRAMUSCULAR | Status: DC | PRN
  Administered 2016-06-05: 10 mL via INTRAVENOUS
  Filled 2016-06-05: qty 10

## 2016-06-05 MED ORDER — HEPARIN SOD (PORK) LOCK FLUSH 100 UNIT/ML IV SOLN
500.0000 [IU] | Freq: Once | INTRAVENOUS | Status: AC | PRN
  Administered 2016-06-05: 500 [IU]
  Filled 2016-06-05: qty 5

## 2016-06-05 MED ORDER — SODIUM CHLORIDE 0.9 % IV SOLN
Freq: Once | INTRAVENOUS | Status: AC
  Administered 2016-06-05: 15:00:00 via INTRAVENOUS

## 2016-06-05 MED ORDER — SODIUM CHLORIDE 0.9 % IV SOLN
7.9000 mg/kg | Freq: Once | INTRAVENOUS | Status: AC
  Administered 2016-06-05: 600 mg via INTRAVENOUS
  Filled 2016-06-05: qty 16

## 2016-06-05 MED ORDER — SODIUM CHLORIDE 0.9% FLUSH
10.0000 mL | INTRAVENOUS | Status: DC | PRN
  Administered 2016-06-05: 10 mL
  Filled 2016-06-05: qty 10

## 2016-06-05 NOTE — Progress Notes (Signed)
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      12/14/2015 -  Chemotherapy    Avastin 10 mg/kg every 2 weeks      05/21/2016 Imaging    MRI brain: Overall no significant interval change in the multifocal areas of enhancement within the right frontal and temporal regions as well as the parasagittal frontal lobes bilaterally compared to 03/25/2016       CHIEF COMPLIANT: Follow-up on Avastin maintenance, recent brain MRI  INTERVAL HISTORY: Michelle Duran is a 60 year old with above-mentioned history of right frontotemporal glioblastoma multiform 8 who underwent resection followed by concurrent chemoradiation with temozolomide. She is currently on Avastin every 2 weeks and most recent brain MRI showed stable findings in the right frontal and temporal zones. She continues to have problems with excessive bruising on her forearms especially on the left forearm. She also has some tremors as well as elevated blood sugars related to dexamethasone which is currently taking half a tablet daily. She also has fatigue issues.  REVIEW OF SYSTEMS:   Constitutional: Denies fevers, chills or abnormal  weight loss Eyes: Denies blurriness of vision Ears, nose, mouth, throat, and face: Denies mucositis or sore throat Respiratory: Denies cough, dyspnea or wheezes Cardiovascular: Denies palpitation, chest discomfort Gastrointestinal:  Denies nausea, heartburn or change in bowel habits Skin: Excessive bruising on her forearms Lymphatics: Denies new lymphadenopathy or easy bruising Neurological:Denies numbness, tingling or new weaknesses Behavioral/Psych: Mood is stable, no new changes  Extremities: No lower extremity edema  All other systems were reviewed with the patient and are negative.  I have reviewed the past medical history, past surgical history, social history and family history with the patient and they are unchanged from previous note.  ALLERGIES:  is allergic to codeine; prednisone; and sulfonamide derivatives.  MEDICATIONS:  Current Outpatient Prescriptions  Medication Sig Dispense Refill  . ACCU-CHEK AVIVA PLUS test strip See admin instructions.  1  . acetaminophen (TYLENOL) 650 MG CR tablet Take 1,300 mg by mouth every 8 (eight) hours as needed for pain. Reported on 11/09/2015    . B-D ULTRAFINE III SHORT PEN 31G X 8 MM MISC     . clonazePAM (KLONOPIN) 0.5 MG disintegrating tablet DISSOLVE 1 TABLET IN MOUTH EVERY 12 HOURS AS NEEDED SEIZURE  0  . dexamethasone (DECADRON) 4 MG tablet Take 1 tablet (4 mg total) by mouth daily. 30 tablet 1  . Insulin Glargine (LANTUS) 100 UNIT/ML Solostar Pen Inject 8 Units into the skin daily at 10 pm. 15 mL 11  . levETIRAcetam (KEPPRA) 500 MG tablet Take 3 tablets (1,500 mg total) by  mouth 2 (two) times daily. 180 tablet 5  . LORazepam (ATIVAN) 0.5 MG tablet TAKE 1 TABLET BY MOUTH EVERY 4 TO 6 HOURS AS NEEDED FOR NAUSEA 30 tablet 0  . pantoprazole (PROTONIX) 40 MG tablet Take 1 tablet (40 mg total) by mouth 2 (two) times daily. 60 tablet 1  . sertraline (ZOLOFT) 100 MG tablet Take 150 mg by mouth at bedtime. Reported on 11/09/2015    . SYNTHROID  25 MCG tablet Take 12.5 mcg by mouth daily before breakfast.   0   No current facility-administered medications for this visit.    Facility-Administered Medications Ordered in Other Visits  Medication Dose Route Frequency Provider Last Rate Last Dose  . sodium chloride flush (NS) 0.9 % injection 10 mL  10 mL Intracatheter PRN Nicholas Lose, MD   10 mL at 06/05/16 1529  . SONAFINE emulsion 1 application  1 application Topical BID Tyler Pita, MD   1 application at 35/36/14 1517    PHYSICAL EXAMINATION: ECOG PERFORMANCE STATUS: 1 - Symptomatic but completely ambulatory  Vitals:   06/05/16 1320  BP: 113/68  Pulse: (!) 52  Resp: 18  Temp: 97.7 F (36.5 C)   Filed Weights   06/05/16 1320  Weight: 181 lb 14.4 oz (82.5 kg)    GENERAL:alert, no distress and comfortable SKIN: Bruising on the forearms EYES: normal, Conjunctiva are pink and non-injected, sclera clear OROPHARYNX:no exudate, no erythema and lips, buccal mucosa, and tongue normal  NECK: supple, thyroid normal size, non-tender, without nodularity LYMPH:  no palpable lymphadenopathy in the cervical, axillary or inguinal LUNGS: clear to auscultation and percussion with normal breathing effort HEART: regular rate & rhythm and no murmurs and no lower extremity edema ABDOMEN:abdomen soft, non-tender and normal bowel sounds MUSCULOSKELETAL:no cyanosis of digits and no clubbing  NEURO: alert & oriented x 3 with fluent speech, no focal motor/sensory deficits EXTREMITIES: No lower extremity edema  LABORATORY DATA:  I have reviewed the data as listed   Chemistry      Component Value Date/Time   NA 142 06/05/2016 1202   K 3.6 06/05/2016 1202   CL 108 02/08/2016 0300   CO2 23 06/05/2016 1202   BUN 14.3 06/05/2016 1202   CREATININE 0.9 06/05/2016 1202      Component Value Date/Time   CALCIUM 9.1 06/05/2016 1202   ALKPHOS 64 06/05/2016 1202   AST 15 06/05/2016 1202   ALT 21 06/05/2016 1202   BILITOT 0.45 06/05/2016 1202        Lab Results  Component Value Date   WBC 9.8 06/05/2016   HGB 13.5 06/05/2016   HCT 39.6 06/05/2016   MCV 95.4 06/05/2016   PLT 197 06/05/2016   NEUTROABS 7.0 (H) 06/05/2016   ASSESSMENT & PLAN:  Glioblastoma multiforme of brain ([facility]) Brain MRI 07/24/2015: Heterogeneous enhancing right frontal lobe mass 4.7 x 3.6 x 3.3 cm with vasogenic edema, heterogeneous enhancing mid right temporal lobe mass 4.1 x 3.1 x 2.2 cm with edema and right-to-left shift and mass effect. 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 and MGMT Methylation: Not Detected) Suggests Poorer response to chemo-XRT and Poor prognosis ----------------------------------------------------------------------------------------------------------------------------------------------------------- Treatment summary:Concurrent chemoradiation with daily temozolomide at 75 mg/m (180 mg dose) started 08/29/2015 completed 10/11/2015 ----------------------------------------------------------------------------------------------------------------------------------------------------------- Brain MRI 07/24/2015: Heterogeneous enhancing right frontal lobe mass 4.7 x 3.6 x 3.3 cm with vasogenic edema, heterogeneous enhancing mid right temporal lobe mass 4.1 x 3.1 x 2.2  cm with edema and right-to-left shift and mass effect. MRI brain 10/23/2015: Enhancement right temporal lobe cavity 4.3 x 3.7 cm previously 3.3 cm, second right frontal lobe nodule 3 x 3.3 cm, previously 2.9 cm, right mesial frontal lobe nodule 11 mm. MRI brain 03/25/2016: Slight worsening of Right frontal posterior perilesional enhancement. No definite progression Short-term follow-up MRI is being planned for the end of September. MRI brain 05/21/2016: Overall no significant interval change in the multifocal areas of enhancement within the right frontal and temporal  regions as well as the parasagittal frontal lobes bilaterally compared to 03/25/2016 ----------------------------------------------------------------------------------------------------------------------------------------------------------- Treatment summary: 1. 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 Concurrent chemoradiation with daily temozolomide at 75 mg/m (180 mg dose) started 08/29/2015 completed 10/11/2015  Current treatment: Avastin 10 mg/kg every 2 weeks starting at 12/14/2015, today is cycle 4 I reduced the dosage of Avastin to 7.5 mg/kg because of excessive bruising and fatigue  Avastin toxicities:  1. Petechial rash 2. Bruising on the arms and thighs: Decreased the dosage of Avastin 3. Palpitations  Options discussed including adding temozolomide to Avastin. Since the current MRI shows stable findings, I did not add temozolomide.  Return to clinic Q 4 weeks andevery 2 weeks for Avastin   No orders of the defined types were placed in this encounter.  The patient has a good understanding of the overall plan. she agrees with it. she will call with any problems that may develop before the next visit here.   Rulon Eisenmenger, MD 06/05/16

## 2016-06-05 NOTE — Patient Instructions (Signed)
[facility] Discharge Instructions for Patients Receiving Chemotherapy  Today you received the following chemotherapy agents Avastin To help prevent nausea and vomiting after your treatment, we encourage you to take your nausea medication as prescribed.   If you develop nausea and vomiting that is not controlled by your nausea medication, call the clinic.   BELOW ARE SYMPTOMS THAT SHOULD BE REPORTED IMMEDIATELY:  *FEVER GREATER THAN 100.5 F  *CHILLS WITH OR WITHOUT FEVER  NAUSEA AND VOMITING THAT IS NOT CONTROLLED WITH YOUR NAUSEA MEDICATION  *UNUSUAL SHORTNESS OF BREATH  *UNUSUAL BRUISING OR BLEEDING  TENDERNESS IN MOUTH AND THROAT WITH OR WITHOUT PRESENCE OF ULCERS  *URINARY PROBLEMS  *BOWEL PROBLEMS  UNUSUAL RASH Items with * indicate a potential emergency and should be followed up as soon as possible.  Feel free to call the clinic you have any questions or concerns. The clinic phone number is [phone].  Please show the CHEMO ALERT CARD at check-in to the Emergency Department and triage nurse.   

## 2016-06-13 ENCOUNTER — Other Ambulatory Visit: Payer: Self-pay

## 2016-06-13 ENCOUNTER — Ambulatory Visit: Payer: Self-pay

## 2016-06-16 ENCOUNTER — Other Ambulatory Visit: Payer: Self-pay | Admitting: Hematology and Oncology

## 2016-06-17 ENCOUNTER — Other Ambulatory Visit: Payer: Self-pay

## 2016-06-17 MED ORDER — LORAZEPAM 0.5 MG PO TABS: ORAL_TABLET | ORAL | 0 refills | Status: DC

## 2016-06-20 ENCOUNTER — Ambulatory Visit: Payer: BC Managed Care – PPO

## 2016-06-20 ENCOUNTER — Other Ambulatory Visit (HOSPITAL_BASED_OUTPATIENT_CLINIC_OR_DEPARTMENT_OTHER): Payer: BC Managed Care – PPO

## 2016-06-20 ENCOUNTER — Ambulatory Visit (HOSPITAL_BASED_OUTPATIENT_CLINIC_OR_DEPARTMENT_OTHER): Payer: BC Managed Care – PPO

## 2016-06-20 VITALS — BP 139/79 | HR 59 | Temp 98.4°F | Resp 17

## 2016-06-20 DIAGNOSIS — C712 Malignant neoplasm of temporal lobe: Secondary | ICD-10-CM | POA: Diagnosis not present

## 2016-06-20 DIAGNOSIS — C711 Malignant neoplasm of frontal lobe: Secondary | ICD-10-CM | POA: Diagnosis not present

## 2016-06-20 DIAGNOSIS — C719 Malignant neoplasm of brain, unspecified: Secondary | ICD-10-CM

## 2016-06-20 DIAGNOSIS — D496 Neoplasm of unspecified behavior of brain: Secondary | ICD-10-CM

## 2016-06-20 DIAGNOSIS — Z5112 Encounter for antineoplastic immunotherapy: Secondary | ICD-10-CM

## 2016-06-20 LAB — COMPREHENSIVE METABOLIC PANEL
ALK PHOS: 61 U/L (ref 40–150)
ALT: 17 U/L (ref 0–55)
ANION GAP: 12 meq/L — AB (ref 3–11)
AST: 18 U/L (ref 5–34)
Albumin: 3.1 g/dL — ABNORMAL LOW (ref 3.5–5.0)
BILIRUBIN TOTAL: 0.44 mg/dL (ref 0.20–1.20)
BUN: 12.1 mg/dL (ref 7.0–26.0)
CALCIUM: 8.8 mg/dL (ref 8.4–10.4)
CO2: 21 mEq/L — ABNORMAL LOW (ref 22–29)
CREATININE: 0.9 mg/dL (ref 0.6–1.1)
Chloride: 109 mEq/L (ref 98–109)
EGFR: 69 mL/min/{1.73_m2} — ABNORMAL LOW (ref >90)
Glucose: 200 mg/dl — ABNORMAL HIGH (ref 70–140)
Potassium: 3.5 mEq/L (ref 3.5–5.1)
Sodium: 142 mEq/L (ref 136–145)
TOTAL PROTEIN: 5.8 g/dL — AB (ref 6.4–8.3)

## 2016-06-20 LAB — CBC WITH DIFFERENTIAL/PLATELET
BASO%: 0.1 % (ref 0.0–2.0)
Basophils Absolute: 0 10*3/uL (ref 0.0–0.1)
EOS%: 0.5 % (ref 0.0–7.0)
Eosinophils Absolute: 0.1 10*3/uL (ref 0.0–0.5)
HEMATOCRIT: 40 % (ref 34.8–46.6)
HGB: 13.7 g/dL (ref 11.6–15.9)
LYMPH#: 1.4 10*3/uL (ref 0.9–3.3)
LYMPH%: 13.6 % — ABNORMAL LOW (ref 14.0–49.7)
MCH: 32.8 pg (ref 25.1–34.0)
MCHC: 34.3 g/dL (ref 31.5–36.0)
MCV: 95.7 fL (ref 79.5–101.0)
MONO#: 0.7 10*3/uL (ref 0.1–0.9)
MONO%: 6.6 % (ref 0.0–14.0)
NEUT#: 8.2 10*3/uL — ABNORMAL HIGH (ref 1.5–6.5)
NEUT%: 79.2 % — AB (ref 38.4–76.8)
PLATELETS: 211 10*3/uL (ref 145–400)
RBC: 4.18 10*6/uL (ref 3.70–5.45)
RDW: 13.1 % (ref 11.2–14.5)
WBC: 10.3 10*3/uL (ref 3.9–10.3)

## 2016-06-20 MED ORDER — SODIUM CHLORIDE 0.9 % IV SOLN
Freq: Once | INTRAVENOUS | Status: AC
  Administered 2016-06-20: 15:00:00 via INTRAVENOUS

## 2016-06-20 MED ORDER — SODIUM CHLORIDE 0.9 % IV SOLN
600.0000 mg | Freq: Once | INTRAVENOUS | Status: AC
  Administered 2016-06-20: 600 mg via INTRAVENOUS
  Filled 2016-06-20: qty 8

## 2016-06-20 MED ORDER — HEPARIN SOD (PORK) LOCK FLUSH 100 UNIT/ML IV SOLN
500.0000 [IU] | Freq: Once | INTRAVENOUS | Status: AC | PRN
  Administered 2016-06-20: 500 [IU]
  Filled 2016-06-20: qty 5

## 2016-06-20 MED ORDER — SODIUM CHLORIDE 0.9 % IJ SOLN
10.0000 mL | INTRAMUSCULAR | Status: DC | PRN
  Administered 2016-06-20: 10 mL via INTRAVENOUS
  Filled 2016-06-20: qty 10

## 2016-06-20 MED ORDER — SODIUM CHLORIDE 0.9% FLUSH
10.0000 mL | INTRAVENOUS | Status: DC | PRN
  Administered 2016-06-20: 10 mL
  Filled 2016-06-20: qty 10

## 2016-06-20 NOTE — Patient Instructions (Signed)
[facility] Discharge Instructions for Patients Receiving Chemotherapy  Today you received the following chemotherapy agents Avastin To help prevent nausea and vomiting after your treatment, we encourage you to take your nausea medication as prescribed.   If you develop nausea and vomiting that is not controlled by your nausea medication, call the clinic.   BELOW ARE SYMPTOMS THAT SHOULD BE REPORTED IMMEDIATELY:  *FEVER GREATER THAN 100.5 F  *CHILLS WITH OR WITHOUT FEVER  NAUSEA AND VOMITING THAT IS NOT CONTROLLED WITH YOUR NAUSEA MEDICATION  *UNUSUAL SHORTNESS OF BREATH  *UNUSUAL BRUISING OR BLEEDING  TENDERNESS IN MOUTH AND THROAT WITH OR WITHOUT PRESENCE OF ULCERS  *URINARY PROBLEMS  *BOWEL PROBLEMS  UNUSUAL RASH Items with * indicate a potential emergency and should be followed up as soon as possible.  Feel free to call the clinic you have any questions or concerns. The clinic phone number is [phone].  Please show the CHEMO ALERT CARD at check-in to the Emergency Department and triage nurse.   

## 2016-06-27 ENCOUNTER — Ambulatory Visit: Payer: Self-pay

## 2016-06-27 ENCOUNTER — Other Ambulatory Visit: Payer: Self-pay

## 2016-07-03 NOTE — Assessment & Plan Note (Signed)
Brain MRI 07/24/2015: Heterogeneous enhancing right frontal lobe mass 4.7 x 3.6 x 3.3 cm with vasogenic edema, heterogeneous enhancing mid right temporal lobe mass 4.1 x 3.1 x 2.2 cm with edema and right-to-left shift and mass effect. 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 and MGMT Methylation: Not Detected) Suggests Poorer response to chemo-XRT and Poor prognosis ----------------------------------------------------------------------------------------------------------------------------------------------------------- Treatment summary:Concurrent chemoradiation with daily temozolomide at 75 mg/m (180 mg dose) started 08/29/2015 completed 10/11/2015 ----------------------------------------------------------------------------------------------------------------------------------------------------------- Brain MRI 07/24/2015: Heterogeneous enhancing right frontal lobe mass 4.7 x 3.6 x 3.3 cm with vasogenic edema, heterogeneous enhancing mid right temporal lobe mass 4.1 x 3.1 x 2.2 cm with edema and right-to-left shift and mass effect. MRI brain 10/23/2015: Enhancement right temporal lobe cavity 4.3 x 3.7 cm previously 3.3 cm, second right frontal lobe nodule 3 x 3.3 cm, previously 2.9 cm, right mesial frontal lobe nodule 11 mm. MRI brain 03/25/2016: Slight worsening of Right frontal posterior perilesional enhancement. No definite progression Short-term follow-up MRI is being planned for the end of September. MRI brain 05/21/2016: Overall no significant interval change in the multifocal areas of enhancement within the right frontal and temporal regions as well as the parasagittal frontal lobes bilaterally compared to 03/25/2016 ----------------------------------------------------------------------------------------------------------------------------------------------------------- Treatment  summary: 1. 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 Concurrent chemoradiation with daily temozolomide at 75 mg/m (180 mg dose) started 08/29/2015 completed 10/11/2015  Current treatment: Avastin 10 mg/kg every 2 weeks starting at 12/14/2015, today is cycle 4 I reduced the dosage of Avastin to 7.5 mg/kg because of excessive bruising and fatigue  Avastin toxicities:  1. Petechial rash 2. Bruising on the arms and thighs: Decreased the dosage of Avastin 3. Palpitations  Options discussed including adding temozolomide to Avastin. Since the current MRI shows stable findings, I did not add temozolomide.  Return to clinic Q 4 weeks andevery 2 weeks for Avastin

## 2016-07-04 ENCOUNTER — Ambulatory Visit: Payer: BC Managed Care – PPO

## 2016-07-04 ENCOUNTER — Other Ambulatory Visit (HOSPITAL_BASED_OUTPATIENT_CLINIC_OR_DEPARTMENT_OTHER): Payer: BC Managed Care – PPO

## 2016-07-04 ENCOUNTER — Encounter: Payer: Self-pay | Admitting: Hematology and Oncology

## 2016-07-04 ENCOUNTER — Ambulatory Visit (HOSPITAL_BASED_OUTPATIENT_CLINIC_OR_DEPARTMENT_OTHER): Payer: BC Managed Care – PPO | Admitting: Hematology and Oncology

## 2016-07-04 ENCOUNTER — Ambulatory Visit (HOSPITAL_BASED_OUTPATIENT_CLINIC_OR_DEPARTMENT_OTHER): Payer: BC Managed Care – PPO

## 2016-07-04 VITALS — BP 130/69

## 2016-07-04 DIAGNOSIS — C711 Malignant neoplasm of frontal lobe: Secondary | ICD-10-CM | POA: Diagnosis not present

## 2016-07-04 DIAGNOSIS — Z5112 Encounter for antineoplastic immunotherapy: Secondary | ICD-10-CM | POA: Diagnosis not present

## 2016-07-04 DIAGNOSIS — C712 Malignant neoplasm of temporal lobe: Secondary | ICD-10-CM

## 2016-07-04 DIAGNOSIS — C719 Malignant neoplasm of brain, unspecified: Secondary | ICD-10-CM

## 2016-07-04 DIAGNOSIS — D496 Neoplasm of unspecified behavior of brain: Secondary | ICD-10-CM

## 2016-07-04 LAB — COMPREHENSIVE METABOLIC PANEL
ALBUMIN: 3.1 g/dL — AB (ref 3.5–5.0)
ALK PHOS: 66 U/L (ref 40–150)
ALT: 26 U/L (ref 0–55)
AST: 18 U/L (ref 5–34)
Anion Gap: 7 mEq/L (ref 3–11)
BUN: 12.7 mg/dL (ref 7.0–26.0)
CALCIUM: 9 mg/dL (ref 8.4–10.4)
CO2: 25 mEq/L (ref 22–29)
Chloride: 111 mEq/L — ABNORMAL HIGH (ref 98–109)
Creatinine: 0.8 mg/dL (ref 0.6–1.1)
EGFR: 86 mL/min/{1.73_m2} — AB (ref >90)
Glucose: 106 mg/dl (ref 70–140)
POTASSIUM: 3.7 meq/L (ref 3.5–5.1)
Sodium: 142 mEq/L (ref 136–145)
Total Bilirubin: 0.44 mg/dL (ref 0.20–1.20)
Total Protein: 5.8 g/dL — ABNORMAL LOW (ref 6.4–8.3)

## 2016-07-04 LAB — CBC WITH DIFFERENTIAL/PLATELET
BASO%: 0.2 % (ref 0.0–2.0)
BASOS ABS: 0 10*3/uL (ref 0.0–0.1)
EOS ABS: 0 10*3/uL (ref 0.0–0.5)
EOS%: 0.3 % (ref 0.0–7.0)
HEMATOCRIT: 41.9 % (ref 34.8–46.6)
HEMOGLOBIN: 14.1 g/dL (ref 11.6–15.9)
LYMPH%: 26.9 % (ref 14.0–49.7)
MCH: 32.3 pg (ref 25.1–34.0)
MCHC: 33.6 g/dL (ref 31.5–36.0)
MCV: 95.9 fL (ref 79.5–101.0)
MONO#: 0.5 10*3/uL (ref 0.1–0.9)
MONO%: 7 % (ref 0.0–14.0)
NEUT#: 4.7 10*3/uL (ref 1.5–6.5)
NEUT%: 65.6 % (ref 38.4–76.8)
Platelets: 220 10*3/uL (ref 145–400)
RBC: 4.36 10*6/uL (ref 3.70–5.45)
RDW: 13.4 % (ref 11.2–14.5)
WBC: 7.2 10*3/uL (ref 3.9–10.3)
lymph#: 1.9 10*3/uL (ref 0.9–3.3)

## 2016-07-04 MED ORDER — HEPARIN SOD (PORK) LOCK FLUSH 100 UNIT/ML IV SOLN
500.0000 [IU] | Freq: Once | INTRAVENOUS | Status: AC | PRN
  Administered 2016-07-04: 500 [IU]
  Filled 2016-07-04: qty 5

## 2016-07-04 MED ORDER — SODIUM CHLORIDE 0.9 % IJ SOLN
10.0000 mL | INTRAMUSCULAR | Status: DC | PRN
  Administered 2016-07-04: 10 mL via INTRAVENOUS
  Filled 2016-07-04: qty 10

## 2016-07-04 MED ORDER — SODIUM CHLORIDE 0.9% FLUSH
10.0000 mL | INTRAVENOUS | Status: DC | PRN
  Administered 2016-07-04: 10 mL
  Filled 2016-07-04: qty 10

## 2016-07-04 MED ORDER — SODIUM CHLORIDE 0.9 % IV SOLN
Freq: Once | INTRAVENOUS | Status: AC
  Administered 2016-07-04: 11:00:00 via INTRAVENOUS

## 2016-07-04 MED ORDER — BEVACIZUMAB CHEMO INJECTION 400 MG/16ML
600.0000 mg | Freq: Once | INTRAVENOUS | Status: AC
  Administered 2016-07-04: 600 mg via INTRAVENOUS
  Filled 2016-07-04: qty 16

## 2016-07-04 NOTE — Progress Notes (Signed)
Patient Care Team: Carol Ada, MD as PCP - General (Family Medicine)  DIAGNOSIS:  Encounter Diagnosis  Name Primary?  . 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      12/14/2015 -  Chemotherapy    Avastin 10 mg/kg every 2 weeks      05/21/2016 Imaging    MRI brain: Overall no significant interval change in the multifocal areas of enhancement within the right frontal and temporal regions as well as the parasagittal frontal lobes bilaterally compared to 03/25/2016       CHIEF COMPLIANT: Follow-up on Avastin  INTERVAL HISTORY: Michelle Duran is a 60 year old with above-mentioned history of glioblastoma. She is currently on Avastin therapy started in [place] 2017. She had extensive bruising on her extremities and reactive reduce the dosage of Avastin. She is tolerating Avastin otherwise fairly well. She does not have any headaches or blurred vision or lightheadedness or dizziness.  REVIEW OF SYSTEMS:   Constitutional: Denies fevers, chills or abnormal weight loss Eyes: Denies blurriness of vision Ears, nose, mouth, throat, and face: Denies mucositis or sore throat Respiratory: Denies cough, dyspnea or  wheezes Cardiovascular: Denies palpitation, chest discomfort Gastrointestinal:  Denies nausea, heartburn or change in bowel habits Skin: Denies abnormal skin rashes Lymphatics: Denies new lymphadenopathy or easy bruising Neurological:Denies numbness, tingling or new weaknesses Behavioral/Psych: Mood is stable, no new changes  Extremities: No lower extremity edema  All other systems were reviewed with the patient and are negative.  I have reviewed the past medical history, past surgical history, social history and family history with the patient and they are unchanged from previous note.  ALLERGIES:  is allergic to codeine; prednisone; and sulfonamide derivatives.  MEDICATIONS:  Current Outpatient Prescriptions  Medication Sig Dispense Refill  . ACCU-CHEK AVIVA PLUS test strip See admin instructions.  1  . acetaminophen (TYLENOL) 650 MG CR tablet Take 1,300 mg by mouth every 8 (eight) hours as needed for pain. Reported on 11/09/2015    . B-D ULTRAFINE III SHORT PEN 31G X 8 MM MISC     . clonazePAM (KLONOPIN) 0.5 MG disintegrating tablet DISSOLVE 1 TABLET IN MOUTH EVERY 12 HOURS AS NEEDED SEIZURE  0  . dexamethasone (DECADRON) 4 MG tablet Take 1 tablet (4 mg total) by mouth daily. 30 tablet 1  . Insulin Glargine (LANTUS) 100 UNIT/ML Solostar Pen Inject 8 Units into the skin daily at 10 pm. 15 mL 11  . levETIRAcetam (KEPPRA) 500 MG tablet Take 3 tablets (1,500 mg total) by mouth 2 (two) times daily. 180 tablet 5  . LORazepam (ATIVAN) 0.5 MG tablet TAKE 1 TABLET BY MOUTH EVERY 4  TO 6 HOURS AS NEEDED FOR NAUSEA 30 tablet 0  . pantoprazole (PROTONIX) 40 MG tablet Take 1 tablet (40 mg total) by mouth 2 (two) times daily. 60 tablet 1  . sertraline (ZOLOFT) 100 MG tablet Take 150 mg by mouth at bedtime. Reported on 11/09/2015    . SYNTHROID 25 MCG tablet Take 12.5 mcg by mouth daily before breakfast.   0   No current facility-administered medications for this visit.    Facility-Administered  Medications Ordered in Other Visits  Medication Dose Route Frequency Provider Last Rate Last Dose  . sodium chloride 0.9 % injection 10 mL  10 mL Intravenous PRN Nicholas Lose, MD   10 mL at 07/04/16 1004  . SONAFINE emulsion 1 application  1 application Topical BID Tyler Pita, MD   1 application at 09/03/70 1517    PHYSICAL EXAMINATION: ECOG PERFORMANCE STATUS: 1 - Symptomatic but completely ambulatory  Vitals:   07/04/16 1029  BP: (!) 139/49  Pulse: (!) 52  Resp: 17  Temp: 97.6 F (36.4 C)   Filed Weights   07/04/16 1029  Weight: 180 lb (81.6 kg)    GENERAL:alert, no distress and comfortable SKIN: skin color, texture, turgor are normal, no rashes or significant lesions EYES: normal, Conjunctiva are pink and non-injected, sclera clear OROPHARYNX:no exudate, no erythema and lips, buccal mucosa, and tongue normal  NECK: supple, thyroid normal size, non-tender, without nodularity LYMPH:  no palpable lymphadenopathy in the cervical, axillary or inguinal LUNGS: clear to auscultation and percussion with normal breathing effort HEART: regular rate & rhythm and no murmurs and no lower extremity edema ABDOMEN:abdomen soft, non-tender and normal bowel sounds MUSCULOSKELETAL:no cyanosis of digits and no clubbing  NEURO: alert & oriented x 3 with fluent speech, no focal motor/sensory deficits EXTREMITIES: No lower extremity edema  LABORATORY DATA:  I have reviewed the data as listed   Chemistry      Component Value Date/Time   NA 142 06/20/2016 1239   K 3.5 06/20/2016 1239   CL 108 02/08/2016 0300   CO2 21 (L) 06/20/2016 1239   BUN 12.1 06/20/2016 1239   CREATININE 0.9 06/20/2016 1239      Component Value Date/Time   CALCIUM 8.8 06/20/2016 1239   ALKPHOS 61 06/20/2016 1239   AST 18 06/20/2016 1239   ALT 17 06/20/2016 1239   BILITOT 0.44 06/20/2016 1239       Lab Results  Component Value Date   WBC 7.2 07/04/2016   HGB 14.1 07/04/2016   HCT 41.9 07/04/2016   MCV  95.9 07/04/2016   PLT 220 07/04/2016   NEUTROABS 4.7 07/04/2016     ASSESSMENT & PLAN:  Glioblastoma multiforme of brain ([place]) Brain MRI 07/24/2015: Heterogeneous enhancing right frontal lobe mass 4.7 x 3.6 x 3.3 cm with vasogenic edema, heterogeneous enhancing mid right temporal lobe mass 4.1 x 3.1 x 2.2 cm with edema and right-to-left shift and mass effect. 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 and MGMT Methylation: Not Detected) Suggests Poorer response to chemo-XRT and Poor prognosis ----------------------------------------------------------------------------------------------------------------------------------------------------------- Treatment summary:Concurrent chemoradiation with daily temozolomide at 75 mg/m (180 mg dose) started 08/29/2015 completed 10/11/2015 ----------------------------------------------------------------------------------------------------------------------------------------------------------- Brain MRI 07/24/2015: Heterogeneous enhancing right frontal lobe mass 4.7 x 3.6 x 3.3 cm with vasogenic edema, heterogeneous enhancing mid right temporal lobe mass 4.1 x 3.1 x 2.2 cm with edema and right-to-left shift and mass effect. MRI brain 10/23/2015: Enhancement right temporal lobe  cavity 4.3 x 3.7 cm previously 3.3 cm, second right frontal lobe nodule 3 x 3.3 cm, previously 2.9 cm, right mesial frontal lobe nodule 11 mm. MRI brain 03/25/2016: Slight worsening of Right frontal posterior perilesional enhancement. No definite progression Short-term follow-up MRI is being planned for the end of September. MRI brain 05/21/2016: Overall no significant interval change in the multifocal areas of enhancement within the right frontal and temporal regions as well as the parasagittal frontal lobes bilaterally compared to  03/25/2016 ----------------------------------------------------------------------------------------------------------------------------------------------------------- Treatment summary: 1. 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 Concurrent chemoradiation with daily temozolomide at 75 mg/m (180 mg dose) started 08/29/2015 completed 10/11/2015  Current treatment: Avastin 10 mg/kg every 2 weeks starting at 12/14/2015, today is cycle 5 I reduced the dosage of Avastin to 7.5 mg/kg with cycle 4 because of excessive bruising and fatigue  Avastin toxicities:  1. Petechial rash:  2. Bruising on the arms and thighs:Improvement with reduced dosage of Avastin 3. Palpitations 4. Intermittent diarrhea: Instructed her to take Imodium 5. Pain behind the right eye: I will obtain an MRI of the brain at the end of November Options discussed including adding temozolomide to Avastin. Since the current MRI shows stable findings, I did not add temozolomide. 6. Lower extremity weakness: Difficulty with climbing stairs, I will refer her to physical therapy.  Return to clinic Q 4 weeks andevery 2 weeks for Avastin    Orders Placed This Encounter  Procedures  . MR BRAIN W CONTRAST    Standing Status:   Future    Standing Expiration Date:   09/03/2017    Order Specific Question:   Reason for Exam (SYMPTOM  OR DIAGNOSIS REQUIRED)    Answer:   Glioblastoma restaging on avastin    Order Specific Question:   Is the patient pregnant?    Answer:   No    Order Specific Question:   Preferred imaging location?    Answer:   [facility] (table limit-350 lbs)    Order Specific Question:   Does the patient have a pacemaker or implanted devices?    Answer:   No    Order Specific Question:   What is the  patient's sedation requirement?    Answer:   No Sedation  . Ambulatory referral to Physical Therapy    Referral Priority:   Routine    Referral Type:   Physical Medicine    Referral Reason:   Specialty Services Required    Requested Specialty:   Physical Therapy    Number of Visits Requested:   1   The patient has a good understanding of the overall plan. she agrees with it. she will call with any problems that may develop before the next visit here.   Rulon Eisenmenger, MD 07/04/16

## 2016-07-04 NOTE — Patient Instructions (Signed)
[facility] Discharge Instructions for Patients Receiving Chemotherapy  Today you received the following chemotherapy agents Avastin To help prevent nausea and vomiting after your treatment, we encourage you to take your nausea medication as prescribed.   If you develop nausea and vomiting that is not controlled by your nausea medication, call the clinic.   BELOW ARE SYMPTOMS THAT SHOULD BE REPORTED IMMEDIATELY:  *FEVER GREATER THAN 100.5 F  *CHILLS WITH OR WITHOUT FEVER  NAUSEA AND VOMITING THAT IS NOT CONTROLLED WITH YOUR NAUSEA MEDICATION  *UNUSUAL SHORTNESS OF BREATH  *UNUSUAL BRUISING OR BLEEDING  TENDERNESS IN MOUTH AND THROAT WITH OR WITHOUT PRESENCE OF ULCERS  *URINARY PROBLEMS  *BOWEL PROBLEMS  UNUSUAL RASH Items with * indicate a potential emergency and should be followed up as soon as possible.  Feel free to call the clinic you have any questions or concerns. The clinic phone number is [phone].  Please show the CHEMO ALERT CARD at check-in to the Emergency Department and triage nurse.   

## 2016-07-09 ENCOUNTER — Telehealth: Payer: Self-pay | Admitting: Hematology and Oncology

## 2016-07-09 NOTE — Telephone Encounter (Signed)
Returned call to patient to confirm next scheduled appointments. Mailed out schedule to patient as well.

## 2016-07-10 IMAGING — US IR FLUORO GUIDE CV LINE*R*
1 series · 1 of 1 positions shown · non-contrast
Comparison: None.

INDICATION: History of glioblastoma. In need of durable intravenous access for
chemotherapy administration.

EXAM:
IMPLANTED PORT A CATH PLACEMENT WITH ULTRASOUND AND FLUOROSCOPIC
GUIDANCE

[Series 1: ir fluoro/shunt/fist · 1 of 1 slices shown]
[im 1/1]
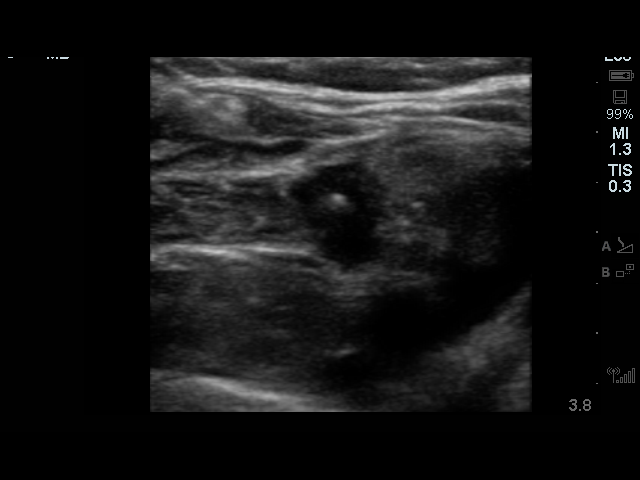

[1 of 1 positions shown; findings below may reference images not displayed]

MEDICATIONS:
Ancef 2 gm IV; The antibiotic was administered within an appropriate
time interval prior to skin puncture.

ANESTHESIA/SEDATION:
Moderate (conscious) sedation was employed during this procedure. A
total of Versed 1.5 mg and Fentanyl 50 mcg was administered
intravenously.

Moderate Sedation Time: 26 minutes. The patient's level of
consciousness and vital signs were monitored continuously by
radiology nursing throughout the procedure under my direct
supervision.

CONTRAST:  None

FLUOROSCOPY TIME:  30 seconds (14 mGy)

COMPLICATIONS:
None immediate.

PROCEDURE:
The procedure, risks, benefits, and alternatives were explained to
the patient. Questions regarding the procedure were encouraged and
answered. The patient understands and consents to the procedure.

The right neck and chest were prepped with chlorhexidine in a
sterile fashion, and a sterile drape was applied covering the
operative field. Maximum barrier sterile technique with sterile
gowns and gloves were used for the procedure. A timeout was
performed prior to the initiation of the procedure. Local anesthesia
was provided with 1% lidocaine with epinephrine.

After creating a small venotomy incision, a micropuncture kit was
utilized to access the internal jugular vein under direct, real-time
ultrasound guidance. Ultrasound image documentation was performed.
The microwire was kinked to measure appropriate catheter length.

A subcutaneous port pocket was then created along the upper chest
wall utilizing a combination of sharp and blunt dissection. The
pocket was irrigated with sterile saline. A single lumen ISP power
injectable port was chosen for placement. The 8 Fr catheter was
tunneled from the port pocket site to the venotomy incision. The
port was placed in the pocket. The external catheter was trimmed to
appropriate length. At the venotomy, an 8 Fr peel-away sheath was
placed over a guidewire under fluoroscopic guidance. The catheter
was then placed through the sheath and the sheath was removed. Final
catheter positioning was confirmed and documented with a
fluoroscopic spot radiograph. The port was accessed with Rantona Bhebhe
needle, aspirated and flushed with heparinized saline.

The venotomy site was closed with an interrupted 4-0 Vicryl suture.
The port pocket incision was closed with interrupted 2-0 Vicryl
suture and the skin was opposed with a running subcuticular 4-0
Vicryl suture. Dermabond and Font were applied to both
incisions. Dressings were placed. The patient tolerated the
procedure well without immediate post procedural complication.
FINDINGS: After catheter placement, the tip lies within the superior
cavoatrial junction. The catheter aspirates and flushes normally and
is ready for immediate use.
IMPRESSION: Successful placement of a right internal jugular approach power
injectable Port-A-Cath. The catheter is ready for immediate use.

## 2016-07-11 ENCOUNTER — Telehealth: Payer: Self-pay | Admitting: *Deleted

## 2016-07-11 NOTE — Telephone Encounter (Signed)
Two voicemail received "asking if heparin was used with Thursday's treatment after Avastin given" and second asked to "schedule MRI. Called providing heparin flush was used at 12:38 after saline given.  Provided Central Scheduling number for patient.  No further questions.

## 2016-07-17 ENCOUNTER — Other Ambulatory Visit: Payer: Self-pay

## 2016-07-17 ENCOUNTER — Telehealth: Payer: Self-pay

## 2016-07-17 DIAGNOSIS — C719 Malignant neoplasm of brain, unspecified: Secondary | ICD-10-CM

## 2016-07-17 NOTE — Telephone Encounter (Signed)
Michelle Duran called she will be bringing her social security disability paperwork today and drop it off with [name]. She is requesting it be completed by Monday, it is her only income. She is also asking about when her MRI is to be done. It is not prior authorized yet. Inbasket to [employer] sent.

## 2016-07-18 ENCOUNTER — Ambulatory Visit (HOSPITAL_BASED_OUTPATIENT_CLINIC_OR_DEPARTMENT_OTHER): Payer: BC Managed Care – PPO

## 2016-07-18 ENCOUNTER — Other Ambulatory Visit (HOSPITAL_BASED_OUTPATIENT_CLINIC_OR_DEPARTMENT_OTHER): Payer: BC Managed Care – PPO

## 2016-07-18 ENCOUNTER — Other Ambulatory Visit: Payer: Self-pay | Admitting: Oncology

## 2016-07-18 ENCOUNTER — Other Ambulatory Visit: Payer: Self-pay

## 2016-07-18 VITALS — BP 147/86 | HR 53 | Temp 98.4°F | Resp 17

## 2016-07-18 DIAGNOSIS — Z5112 Encounter for antineoplastic immunotherapy: Secondary | ICD-10-CM

## 2016-07-18 DIAGNOSIS — C712 Malignant neoplasm of temporal lobe: Secondary | ICD-10-CM | POA: Diagnosis not present

## 2016-07-18 DIAGNOSIS — C719 Malignant neoplasm of brain, unspecified: Secondary | ICD-10-CM

## 2016-07-18 DIAGNOSIS — C711 Malignant neoplasm of frontal lobe: Secondary | ICD-10-CM | POA: Diagnosis not present

## 2016-07-18 LAB — URINALYSIS, MICROSCOPIC - CHCC
BLOOD: NEGATIVE
Bilirubin (Urine): NEGATIVE
GLUCOSE UR CHCC: NEGATIVE mg/dL
Ketones: NEGATIVE mg/dL
NITRITE: NEGATIVE
PH: 6 (ref 4.6–8.0)
PROTEIN: NEGATIVE mg/dL
RBC / HPF: NEGATIVE (ref 0–2)
Specific Gravity, Urine: 1.005 (ref 1.003–1.035)
UROBILINOGEN UR: 0.2 mg/dL (ref 0.2–1)

## 2016-07-18 LAB — CBC WITH DIFFERENTIAL/PLATELET
BASO%: 0.1 % (ref 0.0–2.0)
BASOS ABS: 0 10*3/uL (ref 0.0–0.1)
EOS%: 0.2 % (ref 0.0–7.0)
Eosinophils Absolute: 0 10*3/uL (ref 0.0–0.5)
HCT: 40.6 % (ref 34.8–46.6)
HGB: 14.2 g/dL (ref 11.6–15.9)
LYMPH%: 13 % — AB (ref 14.0–49.7)
MCH: 33 pg (ref 25.1–34.0)
MCHC: 35 g/dL (ref 31.5–36.0)
MCV: 94.4 fL (ref 79.5–101.0)
MONO#: 1.2 10*3/uL — ABNORMAL HIGH (ref 0.1–0.9)
MONO%: 10.8 % (ref 0.0–14.0)
NEUT#: 8.6 10*3/uL — ABNORMAL HIGH (ref 1.5–6.5)
NEUT%: 75.9 % (ref 38.4–76.8)
Platelets: 204 10*3/uL (ref 145–400)
RBC: 4.3 10*6/uL (ref 3.70–5.45)
RDW: 13 % (ref 11.2–14.5)
WBC: 11.3 10*3/uL — ABNORMAL HIGH (ref 3.9–10.3)
lymph#: 1.5 10*3/uL (ref 0.9–3.3)

## 2016-07-18 LAB — COMPREHENSIVE METABOLIC PANEL
ALT: 25 U/L (ref 0–55)
AST: 18 U/L (ref 5–34)
Albumin: 3 g/dL — ABNORMAL LOW (ref 3.5–5.0)
Alkaline Phosphatase: 79 U/L (ref 40–150)
Anion Gap: 11 mEq/L (ref 3–11)
BUN: 15.4 mg/dL (ref 7.0–26.0)
CHLORIDE: 109 meq/L (ref 98–109)
CO2: 21 meq/L — AB (ref 22–29)
CREATININE: 0.9 mg/dL (ref 0.6–1.1)
Calcium: 9.1 mg/dL (ref 8.4–10.4)
EGFR: 67 mL/min/{1.73_m2} — ABNORMAL LOW (ref >90)
Glucose: 137 mg/dl (ref 70–140)
POTASSIUM: 3.7 meq/L (ref 3.5–5.1)
Sodium: 141 mEq/L (ref 136–145)
Total Bilirubin: 0.51 mg/dL (ref 0.20–1.20)
Total Protein: 6 g/dL — ABNORMAL LOW (ref 6.4–8.3)

## 2016-07-18 MED ORDER — SODIUM CHLORIDE 0.9 % IV SOLN
1000.0000 mL | Freq: Once | INTRAVENOUS | Status: AC
  Administered 2016-07-18: 1000 mL via INTRAVENOUS

## 2016-07-18 MED ORDER — SODIUM CHLORIDE 0.9 % IV SOLN
600.0000 mg | Freq: Once | INTRAVENOUS | Status: AC
  Administered 2016-07-18: 600 mg via INTRAVENOUS
  Filled 2016-07-18: qty 16

## 2016-07-18 MED ORDER — SODIUM CHLORIDE 0.9 % IV SOLN
Freq: Once | INTRAVENOUS | Status: AC
  Administered 2016-07-18: 11:00:00 via INTRAVENOUS

## 2016-07-18 MED ORDER — SODIUM CHLORIDE 0.9% FLUSH
10.0000 mL | INTRAVENOUS | Status: DC | PRN
  Administered 2016-07-18: 10 mL
  Filled 2016-07-18: qty 10

## 2016-07-18 MED ORDER — HEPARIN SOD (PORK) LOCK FLUSH 100 UNIT/ML IV SOLN
500.0000 [IU] | Freq: Once | INTRAVENOUS | Status: AC | PRN
Start: 2016-07-18 — End: 2016-07-18
  Administered 2016-07-18: 500 [IU]
  Filled 2016-07-18: qty 5

## 2016-07-18 NOTE — Progress Notes (Signed)
Patient complained of having cold like symptoms; sinus drainage clear, no fevers and feeling "more fatigued than normal"alomg with headaches (WNL per Pt). Per May RN per Dr. Jana Hakim pt to take OTC medications for sinuses and headaches. Pt aware and verbalizes understanding. Pt having difficulty urinating, UA collected and sent to lab.

## 2016-07-18 NOTE — Telephone Encounter (Signed)
Per Darlen[place] 07/18/16 MRI is approved

## 2016-07-18 NOTE — Patient Instructions (Signed)
[facility] Discharge Instructions for Patients Receiving Chemotherapy  Today you received the following chemotherapy agents Avastin   To help prevent nausea and vomiting after your treatment, we encourage you to take your nausea medication as directed.    If you develop nausea and vomiting that is not controlled by your nausea medication, call the clinic.   BELOW ARE SYMPTOMS THAT SHOULD BE REPORTED IMMEDIATELY:  *FEVER GREATER THAN 100.5 F  *CHILLS WITH OR WITHOUT FEVER  NAUSEA AND VOMITING THAT IS NOT CONTROLLED WITH YOUR NAUSEA MEDICATION  *UNUSUAL SHORTNESS OF BREATH  *UNUSUAL BRUISING OR BLEEDING  TENDERNESS IN MOUTH AND THROAT WITH OR WITHOUT PRESENCE OF ULCERS  *URINARY PROBLEMS  *BOWEL PROBLEMS  UNUSUAL RASH Items with * indicate a potential emergency and should be followed up as soon as possible.  Feel free to call the clinic you have any questions or concerns. The clinic phone number is [phone].  Please show the CHEMO ALERT CARD at check-in to the Emergency Department and triage nurse.   

## 2016-07-19 ENCOUNTER — Other Ambulatory Visit: Payer: Self-pay | Admitting: Hematology and Oncology

## 2016-07-22 ENCOUNTER — Telehealth: Payer: Self-pay | Admitting: *Deleted

## 2016-07-22 ENCOUNTER — Telehealth: Payer: Self-pay

## 2016-07-22 NOTE — Telephone Encounter (Signed)
07/19/16- faxed disability forms to [phone]

## 2016-07-22 NOTE — Telephone Encounter (Signed)
Called pt and left vm regarding concerns of URI symptoms. Call back number provided.

## 2016-07-22 NOTE — Telephone Encounter (Addendum)
"  I've developed a cold or sinus infection since I was last seen.  I have orange stuff coming ot my nose.  My head hurts, my nose, behind my eyes and head hurts.  I've taken tylenol not sure what can interfere with my other medications.  Sudafed makes me restless.  Return number [phone]."  Called patient for further assessment.  "I do not have a thermometer.  This started last week.  My granddaughter started day school, had a cough/cold that has gone through my son, mom, dad and now me.  I had a scratchy throat from coughing and could not talk last week.  My body was shaking yesterday and off balance.  I walked, sat down until it passed.  This seizure yesterday lasted less than two minutes.  I may have missed one Keppra because  I'm not feeling good.  I've not reached my PCP.  Now at an appointment in the disability office "

## 2016-07-23 ENCOUNTER — Ambulatory Visit (HOSPITAL_COMMUNITY): Payer: BC Managed Care – PPO

## 2016-07-23 ENCOUNTER — Ambulatory Visit (HOSPITAL_COMMUNITY): Admission: RE | Admit: 2016-07-23 | Payer: BC Managed Care – PPO | Source: Ambulatory Visit

## 2016-07-29 ENCOUNTER — Telehealth: Payer: Self-pay

## 2016-07-29 ENCOUNTER — Encounter (HOSPITAL_COMMUNITY): Payer: Self-pay

## 2016-07-29 ENCOUNTER — Observation Stay (HOSPITAL_COMMUNITY): Payer: BC Managed Care – PPO

## 2016-07-29 ENCOUNTER — Inpatient Hospital Stay (HOSPITAL_COMMUNITY)
Admission: EM | Admit: 2016-07-29 | Discharge: 2016-08-03 | DRG: 055 | Disposition: A | Discharge status: Skilled Nursing Facility | Payer: BC Managed Care – PPO | Attending: Internal Medicine | Admitting: Internal Medicine

## 2016-07-29 ENCOUNTER — Emergency Department (HOSPITAL_COMMUNITY): Payer: BC Managed Care – PPO

## 2016-07-29 DIAGNOSIS — Z87891 Personal history of nicotine dependence: Secondary | ICD-10-CM

## 2016-07-29 DIAGNOSIS — F329 Major depressive disorder, single episode, unspecified: Secondary | ICD-10-CM | POA: Diagnosis present

## 2016-07-29 DIAGNOSIS — Z923 Personal history of irradiation: Secondary | ICD-10-CM

## 2016-07-29 DIAGNOSIS — Z8249 Family history of ischemic heart disease and other diseases of the circulatory system: Secondary | ICD-10-CM

## 2016-07-29 DIAGNOSIS — Z9221 Personal history of antineoplastic chemotherapy: Secondary | ICD-10-CM

## 2016-07-29 DIAGNOSIS — M1990 Unspecified osteoarthritis, unspecified site: Secondary | ICD-10-CM | POA: Diagnosis present

## 2016-07-29 DIAGNOSIS — I951 Orthostatic hypotension: Secondary | ICD-10-CM | POA: Diagnosis not present

## 2016-07-29 DIAGNOSIS — Z885 Allergy status to narcotic agent status: Secondary | ICD-10-CM

## 2016-07-29 DIAGNOSIS — E876 Hypokalemia: Secondary | ICD-10-CM | POA: Diagnosis not present

## 2016-07-29 DIAGNOSIS — Z888 Allergy status to other drugs, medicaments and biological substances status: Secondary | ICD-10-CM

## 2016-07-29 DIAGNOSIS — G40909 Epilepsy, unspecified, not intractable, without status epilepticus: Secondary | ICD-10-CM | POA: Diagnosis present

## 2016-07-29 DIAGNOSIS — Z8041 Family history of malignant neoplasm of ovary: Secondary | ICD-10-CM

## 2016-07-29 DIAGNOSIS — Z794 Long term (current) use of insulin: Secondary | ICD-10-CM

## 2016-07-29 DIAGNOSIS — Z803 Family history of malignant neoplasm of breast: Secondary | ICD-10-CM

## 2016-07-29 DIAGNOSIS — M797 Fibromyalgia: Secondary | ICD-10-CM | POA: Diagnosis present

## 2016-07-29 DIAGNOSIS — E139 Other specified diabetes mellitus without complications: Secondary | ICD-10-CM | POA: Diagnosis present

## 2016-07-29 DIAGNOSIS — Z7952 Long term (current) use of systemic steroids: Secondary | ICD-10-CM

## 2016-07-29 DIAGNOSIS — K219 Gastro-esophageal reflux disease without esophagitis: Secondary | ICD-10-CM | POA: Diagnosis present

## 2016-07-29 DIAGNOSIS — Z882 Allergy status to sulfonamides status: Secondary | ICD-10-CM

## 2016-07-29 DIAGNOSIS — I10 Essential (primary) hypertension: Secondary | ICD-10-CM | POA: Diagnosis present

## 2016-07-29 DIAGNOSIS — F419 Anxiety disorder, unspecified: Secondary | ICD-10-CM | POA: Diagnosis present

## 2016-07-29 DIAGNOSIS — F32A Depression, unspecified: Secondary | ICD-10-CM | POA: Diagnosis present

## 2016-07-29 DIAGNOSIS — R55 Syncope and collapse: Secondary | ICD-10-CM | POA: Diagnosis not present

## 2016-07-29 DIAGNOSIS — E039 Hypothyroidism, unspecified: Secondary | ICD-10-CM | POA: Diagnosis present

## 2016-07-29 DIAGNOSIS — C719 Malignant neoplasm of brain, unspecified: Principal | ICD-10-CM | POA: Diagnosis present

## 2016-07-29 DIAGNOSIS — E119 Type 2 diabetes mellitus without complications: Secondary | ICD-10-CM | POA: Diagnosis present

## 2016-07-29 DIAGNOSIS — E860 Dehydration: Secondary | ICD-10-CM | POA: Diagnosis present

## 2016-07-29 DIAGNOSIS — F410 Panic disorder [episodic paroxysmal anxiety] without agoraphobia: Secondary | ICD-10-CM | POA: Diagnosis present

## 2016-07-29 HISTORY — DX: Malignant (primary) neoplasm, unspecified: C80.1

## 2016-07-29 LAB — CBC
HCT: 47.1 % — ABNORMAL HIGH (ref 36.0–46.0)
Hemoglobin: 16.2 g/dL — ABNORMAL HIGH (ref 12.0–15.0)
MCH: 32.5 pg (ref 26.0–34.0)
MCHC: 34.4 g/dL (ref 30.0–36.0)
MCV: 94.6 fL (ref 78.0–100.0)
Platelets: 287 10*3/uL (ref 150–400)
RBC: 4.98 MIL/uL (ref 3.87–5.11)
RDW: 12.9 % (ref 11.5–15.5)
WBC: 12.2 10*3/uL — AB (ref 4.0–10.5)

## 2016-07-29 LAB — URINALYSIS, ROUTINE W REFLEX MICROSCOPIC
BILIRUBIN URINE: NEGATIVE
GLUCOSE, UA: NEGATIVE mg/dL
HGB URINE DIPSTICK: NEGATIVE
KETONES UR: NEGATIVE mg/dL
Leukocytes, UA: NEGATIVE
NITRITE: NEGATIVE
PH: 5.5 (ref 5.0–8.0)
Protein, ur: NEGATIVE mg/dL
SPECIFIC GRAVITY, URINE: 1.011 (ref 1.005–1.030)

## 2016-07-29 LAB — BASIC METABOLIC PANEL
Anion gap: 11 (ref 5–15)
BUN: 23 mg/dL — ABNORMAL HIGH (ref 6–20)
CALCIUM: 9 mg/dL (ref 8.9–10.3)
CHLORIDE: 106 mmol/L (ref 101–111)
CO2: 23 mmol/L (ref 22–32)
CREATININE: 1.27 mg/dL — AB (ref 0.44–1.00)
GFR calc non Af Amer: 45 mL/min — ABNORMAL LOW (ref >60)
GFR, EST AFRICAN AMERICAN: 52 mL/min — AB (ref >60)
Glucose, Bld: 137 mg/dL — ABNORMAL HIGH (ref 65–99)
Potassium: 2.9 mmol/L — ABNORMAL LOW (ref 3.5–5.1)
Sodium: 140 mmol/L (ref 135–145)

## 2016-07-29 LAB — I-STAT TROPONIN, ED: TROPONIN I, POC: 0.01 ng/mL (ref 0.00–0.08)

## 2016-07-29 LAB — TROPONIN I: Troponin I: <0.03 ng/mL (ref <0.03)

## 2016-07-29 LAB — POTASSIUM: Potassium: 3.7 mmol/L (ref 3.5–5.1)

## 2016-07-29 LAB — CBG MONITORING, ED: Glucose-Capillary: 148 mg/dL — ABNORMAL HIGH (ref 65–99)

## 2016-07-29 LAB — MAGNESIUM: Magnesium: 1.8 mg/dL (ref 1.7–2.4)

## 2016-07-29 MED ORDER — DEXAMETHASONE 2 MG PO TABS
2.0000 mg | ORAL_TABLET | Freq: Every day | ORAL | Status: DC
  Administered 2016-07-30 – 2016-08-03 (×5): 2 mg via ORAL
  Filled 2016-07-29 (×5): qty 1

## 2016-07-29 MED ORDER — GADOBENATE DIMEGLUMINE 529 MG/ML IV SOLN
20.0000 mL | Freq: Once | INTRAVENOUS | Status: AC | PRN
  Administered 2016-07-29: 16 mL via INTRAVENOUS

## 2016-07-29 MED ORDER — ALUM & MAG HYDROXIDE-SIMETH 200-200-20 MG/5ML PO SUSP: 30.0000 mL | Freq: Four times a day (QID) | ORAL | Status: DC | PRN

## 2016-07-29 MED ORDER — ONDANSETRON HCL 4 MG/2ML IJ SOLN
4.0000 mg | Freq: Four times a day (QID) | INTRAMUSCULAR | Status: DC | PRN
  Administered 2016-08-01: 4 mg via INTRAVENOUS
  Filled 2016-07-29: qty 2

## 2016-07-29 MED ORDER — ONDANSETRON HCL 4 MG PO TABS: 4.0000 mg | ORAL_TABLET | Freq: Four times a day (QID) | ORAL | Status: DC | PRN

## 2016-07-29 MED ORDER — LORAZEPAM 2 MG/ML IJ SOLN
1.0000 mg | INTRAMUSCULAR | Status: DC | PRN
  Filled 2016-07-29: qty 1

## 2016-07-29 MED ORDER — SERTRALINE HCL 50 MG PO TABS
150.0000 mg | ORAL_TABLET | Freq: Every day | ORAL | Status: DC
  Administered 2016-07-30 – 2016-08-03 (×5): 150 mg via ORAL
  Filled 2016-07-29 (×5): qty 3

## 2016-07-29 MED ORDER — BACITRACIN ZINC 500 UNIT/GM EX OINT
TOPICAL_OINTMENT | Freq: Two times a day (BID) | CUTANEOUS | Status: DC
  Administered 2016-07-29: 1 via TOPICAL
  Administered 2016-07-30: 16.6667 via TOPICAL
  Administered 2016-07-30: 10:00:00 via TOPICAL
  Administered 2016-07-31: 1 via TOPICAL
  Administered 2016-07-31 – 2016-08-01 (×2): 16.6667 via TOPICAL
  Administered 2016-08-01 – 2016-08-02 (×2): via TOPICAL
  Administered 2016-08-02 – 2016-08-03 (×2): 16.6667 via TOPICAL
  Filled 2016-07-29 (×15): qty 28.35
  Filled 2016-07-29: qty 0.9

## 2016-07-29 MED ORDER — LORAZEPAM BOLUS VIA INFUSION: 1.0000 mg | INTRAVENOUS | Status: DC | PRN

## 2016-07-29 MED ORDER — ACETAMINOPHEN 650 MG RE SUPP: 650.0000 mg | Freq: Four times a day (QID) | RECTAL | Status: DC | PRN

## 2016-07-29 MED ORDER — SODIUM CHLORIDE 0.9 % IV BOLUS (SEPSIS)
1000.0000 mL | Freq: Once | INTRAVENOUS | Status: AC
  Administered 2016-07-29: 1000 mL via INTRAVENOUS

## 2016-07-29 MED ORDER — INSULIN ASPART 100 UNIT/ML ~~LOC~~ SOLN
0.0000 [IU] | Freq: Three times a day (TID) | SUBCUTANEOUS | Status: DC
  Administered 2016-07-30: 1 [IU] via SUBCUTANEOUS
  Administered 2016-07-31: 2 [IU] via SUBCUTANEOUS
  Administered 2016-08-01 – 2016-08-02 (×2): 1 [IU] via SUBCUTANEOUS

## 2016-07-29 MED ORDER — LEVOTHYROXINE SODIUM 25 MCG PO TABS
12.5000 ug | ORAL_TABLET | Freq: Every day | ORAL | Status: DC
  Administered 2016-07-30 – 2016-08-03 (×4): 12.5 ug via ORAL
  Filled 2016-07-29 (×4): qty 0.5

## 2016-07-29 MED ORDER — POTASSIUM CHLORIDE IN NACL 40-0.9 MEQ/L-% IV SOLN
INTRAVENOUS | Status: DC
  Administered 2016-07-29: 125 mL/h via INTRAVENOUS
  Filled 2016-07-29 (×4): qty 1000

## 2016-07-29 MED ORDER — ACETAMINOPHEN 325 MG PO TABS
650.0000 mg | ORAL_TABLET | Freq: Four times a day (QID) | ORAL | Status: DC | PRN
Start: 2016-07-29 — End: 2016-08-03
  Administered 2016-07-31 – 2016-08-02 (×3): 650 mg via ORAL
  Filled 2016-07-29 (×3): qty 2

## 2016-07-29 MED ORDER — PANTOPRAZOLE SODIUM 40 MG PO TBEC
40.0000 mg | DELAYED_RELEASE_TABLET | Freq: Two times a day (BID) | ORAL | Status: DC
  Administered 2016-07-29 – 2016-08-03 (×10): 40 mg via ORAL
  Filled 2016-07-29 (×10): qty 1

## 2016-07-29 MED ORDER — LEVETIRACETAM 500 MG PO TABS
1500.0000 mg | ORAL_TABLET | Freq: Two times a day (BID) | ORAL | Status: DC
  Administered 2016-07-29 – 2016-08-03 (×10): 1500 mg via ORAL
  Filled 2016-07-29 (×10): qty 3

## 2016-07-29 MED ORDER — BENZONATATE 100 MG PO CAPS
100.0000 mg | ORAL_CAPSULE | Freq: Three times a day (TID) | ORAL | Status: DC | PRN
  Administered 2016-08-02: 100 mg via ORAL
  Filled 2016-07-29: qty 1

## 2016-07-29 MED ORDER — SODIUM CHLORIDE 0.9% FLUSH
3.0000 mL | Freq: Two times a day (BID) | INTRAVENOUS | Status: DC
  Administered 2016-07-29 – 2016-08-01 (×5): 3 mL via INTRAVENOUS

## 2016-07-29 MED ORDER — POTASSIUM CHLORIDE CRYS ER 20 MEQ PO TBCR
40.0000 meq | EXTENDED_RELEASE_TABLET | Freq: Once | ORAL | Status: AC
  Administered 2016-07-29: 40 meq via ORAL
  Filled 2016-07-29: qty 2

## 2016-07-29 MED ORDER — INSULIN GLARGINE 100 UNIT/ML ~~LOC~~ SOLN
5.0000 [IU] | Freq: Every day | SUBCUTANEOUS | Status: DC
  Administered 2016-07-29 – 2016-08-02 (×5): 5 [IU] via SUBCUTANEOUS
  Filled 2016-07-29 (×6): qty 0.05

## 2016-07-29 NOTE — ED Triage Notes (Addendum)
PT RECEIVED FROM HOME VIA EMS FOR WEAKNESS AND A SYNCOPAL EPISODE. PER EMS, THE PT WAS ON THE COMMODE AND SHE FELL OVER BETWEEN THE TOILET AND THE TUB. WHEN EMS ARRIVED, THE PT HAD DEFECATED ALL OVER HERSELF AND THE FLOOR. PT DENIED PAIN AT THAT TIME. EMS STS WHEN THEY GOT HER BACK ON THE COMMODE, THE PT HAD ANOTHER SYNCOPAL EPISODE. PT WAS GIVEN NS 500ML BOLUS. INITIAL BP 80/40, HR 50, WITH POSITIVE ORTHOSTATICS.PT CURRENTLY UNDER GOING CHEMO FOR BRAIN CA, LAST CHEMO WAS 2 WEEKS AGO. PT HAS NO OTHER COMPLAINTS AT THIS TIME.

## 2016-07-29 NOTE — ED Notes (Signed)
Pt to MRI

## 2016-07-29 NOTE — H&P (Signed)
History and Physical    Michelle Duran [id number] DOB: [birth year] DOA: 07/29/2016  PCP: Reginia Naas, MD  Patient coming from:Home  Chief Complaint: Fall   HPI: Michelle Duran is a 60 y.o. 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 Patient reported that she was in the bathroom when she lost her consciousness and had a fall. Patient denied hitting her head. As per EMS note, the patient was found to have blood pressure of 80/40 and heart rate 50s with positive orthostatic hypotension. Patient denied seizure-like activity. Denied jerky movement, tongue biting, incontinence of bowel or urine. Denied fever, chills.  Patient reported decreased oral intake for about a week.  In ER, patient denied headache, dizziness, blindness, nausea, vomiting, pain, shortness of breath, abdominal pain, diarrhea or constipation. Denied dysuria, urgency or frequency.  ED Course: Patient was found to have orthostatic hypotension. Treated with IV fluid bolus. Plan to get MRI of the brain for further evaluation.  Review of Systems: As per HPI otherwise 10 point review of systems negative.    Past Medical History:  Diagnosis Date  . Anxiety   . Arthritis   . Cancer ([place])    BRAIN  . Complication of anesthesia    Pt was shaking after having epidural  during c-section  . Diabetes mellitus without complication ([place])   . Family history of adverse reaction to anesthesia    mother had PONV  . Fibromyalgia   . Gallstone   . GERD (gastroesophageal reflux disease)   . History of hiatal hernia   . Hypertension   . Hypothyroidism   . Mass of adrenal gland ([place])   . Mitral valve prolapse   . Neoplasm of brain ([place])   . Nocturia   . Numbness and tingling    fingers and toes  . Panic attacks   . PVC's  (premature ventricular contractions)    saw cardiologist Dr. Claiborne Billings > 3 years ago with echo and stress (entered 08/08/2015)  . Seizures ([place])   . Tobacco abuse   . Vertigo   . Vitamin D deficiency   . Wears glasses     Past Surgical History:  Procedure Laterality Date  . CESAREAN SECTION    . CRANIOTOMY Right 08/09/2015   Procedure: Right Frontotemporal craniotomy for tumor resection with stereotactic navigation;  Surgeon: Kevan Ny Ditty, MD;  Location: [place] [facility];  Service: Neurosurgery;  Laterality: Right;  Right Frontotemporal craniotomy for tumor resection with stereotactic navigation  . esophagus stretched    . MULTIPLE TOOTH EXTRACTIONS       Social History: reports that she quit smoking about a year ago. Her smoking use included Cigarettes. She has a 41.00 pack-year smoking history. She has never used smokeless tobacco. She reports that she does not drink alcohol or use drugs.  Allergies  Allergen Reactions  . Codeine     REACTION: vomiting  . Prednisone     Panic attacks  . Sulfonamide Derivatives     REACTION: nausea    Family History  Problem Relation Age of Onset  . Hypertension Father   . Glaucoma    . Ovarian cancer Cousin   . Breast cancer Paternal Grandmother      Prior to Admission medications   Medication Sig Start Date End Date Taking? Authorizing Provider  acetaminophen (TYLENOL) 650 MG CR tablet Take 1,300  mg by mouth every 8 (eight) hours as needed for pain. Reported on 11/09/2015   Yes Historical Provider, MD  amoxicillin (AMOXIL) 875 MG tablet Take 875 mg by mouth 2 (two) times daily. Started 11/22... 07/24/16  Yes Historical Provider, MD  benzonatate (TESSALON) 100 MG capsule Take 100 mg by mouth 3 (three) times daily as needed for cough. 07/24/16  Yes Historical Provider, MD  clonazePAM (KLONOPIN) 0.5 MG disintegrating tablet DISSOLVE 1 TABLET IN MOUTH EVERY 12 HOURS AS NEEDED SEIZURE 11/01/15  Yes Historical Provider, MD  dexamethasone  (DECADRON) 4 MG tablet Take 1 tablet (4 mg total) by mouth daily. Patient taking differently: Take 2 mg by mouth daily.  03/29/16  Yes Nicholas Lose, MD  Insulin Glargine (LANTUS) 100 UNIT/ML Solostar Pen Inject 8 Units into the skin daily at 10 pm. 11/28/15  Yes Nicholas Lose, MD  levETIRAcetam (KEPPRA) 500 MG tablet Take 3 tablets (1,500 mg total) by mouth 2 (two) times daily. 03/29/16  Yes Hayden Pedro, PA-C  LORazepam (ATIVAN) 0.5 MG tablet TAKE 1 TABLET BY MOUTH EVERY 4 TO 6 HOURS AS NEEDED FOR NAUSEA 06/17/16  Yes Nicholas Lose, MD  pantoprazole (PROTONIX) 40 MG tablet Take 1 tablet (40 mg total) by mouth 2 (two) times daily. 04/09/16  Yes Nicholas Lose, MD  sertraline (ZOLOFT) 100 MG tablet Take 150 mg by mouth at bedtime. Reported on 11/09/2015   Yes Historical Provider, MD  SYNTHROID 25 MCG tablet Take 12.5 mcg by mouth daily before breakfast.  10/13/15  Yes Historical Provider, MD  [place] test strip See admin instructions. 01/12/16   Historical Provider, MD  B-D ULTRAFINE III SHORT PEN 31G X 8 MM MISC  11/03/15   Historical Provider, MD    Physical Exam: Vitals:   07/29/16 1601 07/29/16 1605 07/29/16 1611 07/29/16 1835  BP: (S) 132/56 (S) 126/79 (S) 91/76 157/87  Pulse: (!) 49 (!) 58 74 (!) 51  Resp: (S) 14 (S) 16 (S) 16 13  Temp:      TempSrc:      SpO2: 100% (S) 99% (S) 99% 97%  Weight:      Height:          Constitutional: NAD, calm, comfortable Vitals:   07/29/16 1601 07/29/16 1605 07/29/16 1611 07/29/16 1835  BP: (S) 132/56 (S) 126/79 (S) 91/76 157/87  Pulse: (!) 49 (!) 58 74 (!) 51  Resp: (S) 14 (S) 16 (S) 16 13  Temp:      TempSrc:      SpO2: 100% (S) 99% (S) 99% 97%  Weight:      Height:       Eyes: PERRL, lids and conjunctivae normal ENMT: Mucous membranes are Dry. Posterior pharynx clear of any exudate or lesions.Normal dentition.  Neck: normal, supple, no masses, no thyromegaly Respiratory: clear to auscultation bilaterally, no wheezing, no  crackles. Normal respiratory effort. No accessory muscle use.  Cardiovascular: Regular rate and rhythm, no murmurs / rubs / gallops. No extremity edema. 2+ pedal pulses. No carotid bruits.  Abdomen: no tenderness, no masses palpated. No hepatosplenomegaly. Bowel sounds positive.  Musculoskeletal: no clubbing / cyanosis. No joint deformity upper and lower extremities. Good ROM, no contractures. Normal muscle tone.  Skin: Erythematous and open wound on left upper extremity. Neurologic: CN 2-12 grossly intact. Strength 5/5 in all 4.  Psychiatric: Normal judgment and insight. Alert and oriented x 3. Normal mood.    Labs on Admission: I have personally reviewed following labs and imaging studies  CBC:  Recent Labs Lab 07/29/16 1555  WBC 12.2*  HGB 16.2*  HCT 47.1*  MCV 94.6  PLT [id number]   Basic Metabolic Panel:  Recent Labs Lab 07/29/16 1555  NA 140  K 2.9*  CL 106  CO2 23  GLUCOSE 137*  BUN 23*  CREATININE 1.27*  CALCIUM 9.0  MG 1.8   GFR: Estimated Creatinine Clearance: 48 mL/min (by C-G formula based on SCr of 1.27 mg/dL (H)). Liver Function Tests: No results for input(s): AST, ALT, ALKPHOS, BILITOT, PROT, ALBUMIN in the last 168 hours. No results for input(s): LIPASE, AMYLASE in the last 168 hours. No results for input(s): AMMONIA in the last 168 hours. Coagulation Profile: No results for input(s): INR, PROTIME in the last 168 hours. Cardiac Enzymes: No results for input(s): CKTOTAL, CKMB, CKMBINDEX, TROPONINI in the last 168 hours. BNP (last 3 results) No results for input(s): PROBNP in the last 8760 hours. HbA1C: No results for input(s): HGBA1C in the last 72 hours. CBG:  Recent Labs Lab 07/29/16 1538  GLUCAP 148*   Lipid Profile: No results for input(s): CHOL, HDL, LDLCALC, TRIG, CHOLHDL, LDLDIRECT in the last 72 hours. Thyroid Function Tests: No results for input(s): TSH, T4TOTAL, FREET4, T3FREE, THYROIDAB in the last 72 hours. Anemia Panel: No results  for input(s): VITAMINB12, FOLATE, FERRITIN, TIBC, IRON, RETICCTPCT in the last 72 hours. Urine analysis:    Component Value Date/Time   COLORURINE YELLOW 02/08/2016 0404   APPEARANCEUR CLEAR 02/08/2016 0404   LABSPEC 1.005 07/18/2016 1452   PHURINE 6.0 07/18/2016 1452   PHURINE 6.0 02/08/2016 0404   GLUCOSEU Negative 07/18/2016 1452   HGBUR Negative 07/18/2016 1452   HGBUR NEGATIVE 02/08/2016 0404   BILIRUBINUR Negative 07/18/2016 1452   KETONESUR Negative 07/18/2016 1452   KETONESUR NEGATIVE 02/08/2016 0404   PROTEINUR Negative 07/18/2016 1452   PROTEINUR 30 06/05/2016 1202   UROBILINOGEN 0.2 07/18/2016 1452   NITRITE Negative 07/18/2016 1452   NITRITE NEGATIVE 02/08/2016 0404   LEUKOCYTESUR Trace 07/18/2016 1452   Sepsis Labs: !!!!!!!!!!!!!!!!!!!!!!!!!!!!!!!!!!!!!!!!!!!! @LABRCNTIP (procalcitonin:4,lacticidven:4) )No results found for this or any previous visit (from the past 240 hour(s)).   Radiological Exams on Admission: No results found.  EKG: Independently reviewed. T wave inversion in V3 V4 V5 and V6. Which are new  Assessment/Plan  # Syncope and collapse: Most likely due to orthostatic hypotension. Patient reported decreased oral intake for about a week. Already received 2 L of IV fluid in Er. -She has no focal neurological deficit and compliant with Keppra at home. -Patient is already scheduled for outpatient MRI after 2 days. I discussed with the ER physician, plan to get MRI of her brain to further evaluate the tumor. Continue home dose of Decadron. -Admitted in telemetry, check repeat orthostatic blood pressure, continue IV fluid -Given new EKG changes in anterolateral lateral leads, I will repeat serial troponin, echocardiogram. Repeat EKG in the morning. Patient has no chest pain and her first troponin is negative. -Checking TSH -Seizure precaution, Ativan when necessary.  # Glioblastoma multiforme of brain on chemotherapy:    #Hypokalemia: Magnesium level  1.8. Adding potassium in IV fluid. Already received. -Replete potassium level.  #Hypothyroidism: Continue Synthroid. Checking TSH level.  #Type 2 diabetes on insulin: Lower the dose of Lantus because of patient's decreased oral intake. Continue sliding scale. Checking A1c.   DVT prophylaxis: SCD. No anticoagulation because of brain mass Code Status: Full code Family Communication: No family present at bedside Disposition Plan: Likely discharge home in 1-2 days Consults called:  None Admission status: Observation.   Dron Tanna Furry MD [facility] Pager [phone]  If 7PM-7AM, please contact night-coverage [email or web address] Password [facility]  07/29/2016, 6:39 PM

## 2016-07-29 NOTE — Telephone Encounter (Signed)
"  I was talking with Dr. Harrell Lark nurse about my daughter's MRI appointment and the call was lost."    Informed her of appointment 07-31-2016.  Arrive at 6:45 am to [facility].   "I can't do that.  I was just in the hospital with heart problems."   Radiology Centralized Scheduling number provided to reschedule.

## 2016-07-29 NOTE — Telephone Encounter (Signed)
Michelle Duran's mother called stating that she needed to reschedule her MRI Brain because she had call to cancel it 2 weeks ago due to feeling ill. Pt was upset that nobody had tried to reschedule her MRI appt. Told pt mother that the scheduling is done through [facility] central scheduling dept and we are not notified to reschedule when a pt cancels a diagnostic appointment. Told pt mother that I will reschedule MRI and will call her back with confirmed time and date this week. Pt is also having complaints of decreased appetite and not drinking enough for 5 days not. Pt states that she is weak and not able to eat very much. Denies any diarrhea or dizziness at this time. Pt for lab,fl, inf on Thurs 11/30th. Offered to have pt come in for [facility] management today. Pt mom will speak with Janel and see if she can come in for IVF's today.       Called [facility] central scheduling to schedule pt asap for MRI this week. First avaiable MRI appt at 7am at [facility] on Wed Nov 29th. Called [name]'s phone and mother's phone and left voicemail for both numbers. Call back numbers provided to confirm their appt and to see if pt would like to come in for SM.

## 2016-07-29 NOTE — ED Notes (Signed)
Admitting Provider at bedside. 

## 2016-07-29 NOTE — ED Notes (Signed)
Patient transported to MRI 

## 2016-07-29 NOTE — ED Provider Notes (Signed)
[place] [facility] DEPT Provider Note   CSN: [id number] Arrival date & time: 07/29/16  1515     History   Chief Complaint Chief Complaint  Patient presents with  . Loss of Consciousness  . Weakness    HPI Michelle Duran is a 60 y.o. female.  The history is provided by the patient and a relative.  Loss of Consciousness   This is a new problem. The current episode started 1 to 2 hours ago. The problem occurs constantly. The problem has been resolved. She lost consciousness for a period of less than one minute. The problem is associated with bowel movements. Associated symptoms include seizures (on keppra for ppx) and weakness. Pertinent negatives include bladder incontinence, bowel incontinence, chest pain, fever, focal sensory loss, headaches, visual change and vomiting.    Past Medical History:  Diagnosis Date  . Anxiety   . Arthritis   . Cancer ([place])    BRAIN  . Complication of anesthesia    Pt was shaking after having epidural  during c-section  . Diabetes mellitus without complication ([place])   . Family history of adverse reaction to anesthesia    mother had PONV  . Fibromyalgia   . Gallstone   . GERD (gastroesophageal reflux disease)   . History of hiatal hernia   . Hypertension   . Hypothyroidism   . Mass of adrenal gland ([place])   . Mitral valve prolapse   . Neoplasm of brain ([place])   . Nocturia   . Numbness and tingling    fingers and toes  . Panic attacks   . PVC's (premature ventricular contractions)    saw cardiologist Dr. Claiborne Billings > 3 years ago with echo and stress (entered 08/08/2015)  . Seizures ([place])   . Tobacco abuse   . Vertigo   . Vitamin D deficiency   . Wears glasses     Patient Active Problem List   Diagnosis Date Noted  . Palpitations 03/11/2016  . Petechial rash 01/12/2016  . Peripheral edema 01/12/2016  . Seizures ([place])   . DNR (do not resuscitate) 11/10/2015  . Palliative care encounter 11/10/2015  . Dental abscess 10/11/2015  . Brain  neoplasm ([place]) 08/09/2015  . Glioblastoma multiforme of brain ([place])   . Depression   . Anxiety state   . Tobacco abuse   . Diabetes mellitus, iatrogenic ([place])   . Brain mass 07/24/2015  . Facial droop 07/24/2015  . Slurred speech 07/24/2015  . Hypothyroidism 06/17/2008  . GERD 06/17/2008  . IRRITABLE BOWEL SYNDROME 06/17/2008  . ANAL FISSURE, HX OF 06/17/2008  . PANIC DISORDER 11/05/2007  . MIGRAINE HEADACHE 11/05/2007  . Essential hypertension 11/05/2007  . 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 CARDIAC ARRHYTHMIA 11/05/2007  . RECTAL FISSURE 11/05/2007  . FIBROMYALGIA 11/05/2007  . HIATAL HERNIA 05/08/2001    Past Surgical History:  Procedure Laterality Date  . CESAREAN SECTION    . CRANIOTOMY Right 08/09/2015   Procedure: Right Frontotemporal craniotomy for tumor resection with stereotactic navigation;  Surgeon: Kevan Ny Ditty, MD;  Location: [place] [facility];  Service: Neurosurgery;  Laterality: Right;  Right Frontotemporal craniotomy for tumor resection with stereotactic navigation  . esophagus stretched    . MULTIPLE TOOTH EXTRACTIONS      OB History    No data available       Home Medications    Prior to Admission medications   Medication Sig Start Date End Date Taking? Authorizing Provider  acetaminophen (TYLENOL) 650 MG CR tablet Take 1,300  mg by mouth every 8 (eight) hours as needed for pain. Reported on 11/09/2015   Yes Historical Provider, MD  amoxicillin (AMOXIL) 875 MG tablet Take 875 mg by mouth 2 (two) times daily. Started 11/22... 07/24/16  Yes Historical Provider, MD  benzonatate (TESSALON) 100 MG capsule Take 100 mg by mouth 3 (three) times daily as needed for cough. 07/24/16  Yes Historical Provider, MD  clonazePAM (KLONOPIN) 0.5 MG disintegrating tablet DISSOLVE 1 TABLET IN MOUTH EVERY 12 HOURS AS NEEDED SEIZURE 11/01/15  Yes Historical Provider, MD  dexamethasone (DECADRON) 4 MG tablet Take 1 tablet (4 mg total) by mouth daily. Patient taking  differently: Take 2 mg by mouth daily.  03/29/16  Yes Nicholas Lose, MD  Insulin Glargine (LANTUS) 100 UNIT/ML Solostar Pen Inject 8 Units into the skin daily at 10 pm. 11/28/15  Yes Nicholas Lose, MD  levETIRAcetam (KEPPRA) 500 MG tablet Take 3 tablets (1,500 mg total) by mouth 2 (two) times daily. 03/29/16  Yes Hayden Pedro, PA-C  LORazepam (ATIVAN) 0.5 MG tablet TAKE 1 TABLET BY MOUTH EVERY 4 TO 6 HOURS AS NEEDED FOR NAUSEA 06/17/16  Yes Nicholas Lose, MD  pantoprazole (PROTONIX) 40 MG tablet Take 1 tablet (40 mg total) by mouth 2 (two) times daily. 04/09/16  Yes Nicholas Lose, MD  sertraline (ZOLOFT) 100 MG tablet Take 150 mg by mouth at bedtime. Reported on 11/09/2015   Yes Historical Provider, MD  SYNTHROID 25 MCG tablet Take 12.5 mcg by mouth daily before breakfast.  10/13/15  Yes Historical Provider, MD  [place] test strip See admin instructions. 01/12/16   Historical Provider, MD  B-D ULTRAFINE III SHORT PEN 31G X 8 MM MISC  11/03/15   Historical Provider, MD    Family History Family History  Problem Relation Age of Onset  . Hypertension Father   . Glaucoma    . Ovarian cancer Cousin   . Breast cancer Paternal Grandmother     Social History Social History  Substance Use Topics  . Smoking status: Former Smoker    Packs/day: 1.00    Years: 41.00    Types: Cigarettes    Quit date: 07/24/2015  . Smokeless tobacco: Never Used  . Alcohol use No     Allergies   Codeine; Prednisone; and Sulfonamide derivatives   Review of Systems Review of Systems  Constitutional: Negative for fever.  Cardiovascular: Positive for syncope. Negative for chest pain.  Gastrointestinal: Negative for bowel incontinence and vomiting.  Genitourinary: Negative for bladder incontinence.  Neurological: Positive for seizures (on keppra for ppx) and weakness. Negative for headaches.     Physical Exam Updated Vital Signs BP (S) 91/76 (BP Location: Left Arm)   Pulse 74   Temp 97.4 F (36.3  C) (Oral)   Resp (S) 16   Ht 5\' 3"  (1.6 m)   Wt 178 lb (80.7 kg)   SpO2 (S) 99%   BMI 31.53 kg/m   Physical Exam  Constitutional: She is oriented to person, place, and time. She appears well-developed and well-nourished. No distress.  HENT:  Head: Normocephalic.  Nose: Nose normal.  Eyes: Conjunctivae are normal. Pupils are equal, round, and reactive to light.  Neck: Neck supple. No tracheal deviation present.  Cardiovascular: Regular rhythm and normal heart sounds.  Bradycardia present.   Pulmonary/Chest: Effort normal. No respiratory distress.  Abdominal: Soft. She exhibits no distension.  Neurological: She is alert and oriented to person, place, and time. No cranial nerve deficit.  Skin: Skin is  warm and dry. Capillary refill takes less than 2 seconds.  Psychiatric: She has a normal mood and affect.     ED Treatments / Results  Labs (all labs ordered are listed, but only abnormal results are displayed) Labs Reviewed  BASIC METABOLIC PANEL - Abnormal; Notable for the following:       Result Value   Potassium 2.9 (*)    Glucose, Bld 137 (*)    BUN 23 (*)    Creatinine, Ser 1.27 (*)    GFR calc non Af Amer 45 (*)    GFR calc Af Amer 52 (*)    All other components within normal limits  CBC - Abnormal; Notable for the following:    WBC 12.2 (*)    Hemoglobin 16.2 (*)    HCT 47.1 (*)    All other components within normal limits  CBG MONITORING, ED - Abnormal; Notable for the following:    Glucose-Capillary 148 (*)    All other components within normal limits  MAGNESIUM  URINALYSIS, ROUTINE W REFLEX MICROSCOPIC (NOT AT [facility])  I-STAT TROPOININ, ED    EKG  EKG Interpretation  Date/Time:  Monday July 29 2016 15:37:58 EST Ventricular Rate:  53 PR Interval:    QRS Duration: 104 QT Interval:  504 QTC Calculation: 474 R Axis:   11 Text Interpretation:  Sinus rhythm Probable left ventricular hypertrophy Abnormal T, consider ischemia, anterior leads New since  previous tracing Confirmed by Arelene Moroni MD, Bobie Kistler [id number]) on 07/29/2016 3:59:08 PM       Radiology No results found.  Procedures Procedures (including critical care time)  Medications Ordered in ED Medications  bacitracin ointment (1 application Topical Given 07/29/16 1733)  potassium chloride SA (K-DUR,KLOR-CON) CR tablet 40 mEq (40 mEq Oral Given 07/29/16 1733)  sodium chloride 0.9 % bolus 1,000 mL (1,000 mLs Intravenous New Bag/Given 07/29/16 1732)  sodium chloride 0.9 % bolus 1,000 mL (1,000 mLs Intravenous New Bag/Given 07/29/16 1732)    Initial Impression / Assessment and Plan / ED Course  I have reviewed the triage vital signs and the nursing notes.  Pertinent labs & imaging results that were available during my care of the patient were reviewed by me and considered in my medical decision making (see chart for details).  Clinical Course    60 year old female with history of glioblastoma presents with syncopal episode that occurred just prior to arrival while sitting on the toilet having a bowel movement. She blacked out for a short period of time and woke up on the floor lying on her back. She was slightly hypotensive on transfer with EMS. Here her blood pressure drops into the [id number] systolic with standing and she becomes symptomatic suggesting orthostasis likely secondary to dehydration as she states she has not eaten in the last 5 or 6 days. Her initial workup is concerning for new EKG findings of inverted T waves in precordial leads. Initial troponin is negative with this finding and active symptoms complicated by a known brain mass I will recommend admission to the hospital for observation on telemetry. Other differential considerations include seizure given the mass effect and the patient states compliance with home Keppra therapy.  She is due for MR brain with and without contrast in 2 days so this study was ordered for immediate evaluation to rule out intracranial hemorrhage or  other complicating feature of her known mass.  Patient was started on gentle IV fluid resuscitation and supplemental potassium for notable hypokalemia. Magnesium level was drawn for analysis  as well.  Hospitalist was consulted for admission and will see the patient in the emergency department.   I spoke with Dr. Carolin Sicks who will be admitting patient.   Final Clinical Impressions(s) / ED Diagnoses   Final diagnoses:  Dehydration  Orthostatic hypotension  Hypokalemia    New Prescriptions New Prescriptions   No medications on file     Leo Grosser, MD 07/29/16 1815

## 2016-07-29 NOTE — ED Notes (Signed)
Pt returned from MRI.  Pt placed on bedpan.

## 2016-07-29 NOTE — ED Notes (Signed)
Bed: [id number] Expected date:  Expected time:  Means of arrival:  Comments: EMS- 60yo F, [place] Pt/syncope/orthostatic

## 2016-07-30 DIAGNOSIS — Z794 Long term (current) use of insulin: Secondary | ICD-10-CM | POA: Diagnosis not present

## 2016-07-30 DIAGNOSIS — R55 Syncope and collapse: Secondary | ICD-10-CM | POA: Diagnosis present

## 2016-07-30 DIAGNOSIS — Z888 Allergy status to other drugs, medicaments and biological substances status: Secondary | ICD-10-CM | POA: Diagnosis not present

## 2016-07-30 DIAGNOSIS — G40909 Epilepsy, unspecified, not intractable, without status epilepticus: Secondary | ICD-10-CM | POA: Diagnosis present

## 2016-07-30 DIAGNOSIS — Z8249 Family history of ischemic heart disease and other diseases of the circulatory system: Secondary | ICD-10-CM | POA: Diagnosis not present

## 2016-07-30 DIAGNOSIS — F329 Major depressive disorder, single episode, unspecified: Secondary | ICD-10-CM | POA: Diagnosis present

## 2016-07-30 DIAGNOSIS — Z8041 Family history of malignant neoplasm of ovary: Secondary | ICD-10-CM | POA: Diagnosis not present

## 2016-07-30 DIAGNOSIS — E119 Type 2 diabetes mellitus without complications: Secondary | ICD-10-CM | POA: Diagnosis present

## 2016-07-30 DIAGNOSIS — R531 Weakness: Secondary | ICD-10-CM | POA: Diagnosis not present

## 2016-07-30 DIAGNOSIS — C712 Malignant neoplasm of temporal lobe: Secondary | ICD-10-CM | POA: Diagnosis not present

## 2016-07-30 DIAGNOSIS — M1990 Unspecified osteoarthritis, unspecified site: Secondary | ICD-10-CM | POA: Diagnosis present

## 2016-07-30 DIAGNOSIS — Z7952 Long term (current) use of systemic steroids: Secondary | ICD-10-CM | POA: Diagnosis not present

## 2016-07-30 DIAGNOSIS — I10 Essential (primary) hypertension: Secondary | ICD-10-CM | POA: Diagnosis present

## 2016-07-30 DIAGNOSIS — Z87891 Personal history of nicotine dependence: Secondary | ICD-10-CM | POA: Diagnosis not present

## 2016-07-30 DIAGNOSIS — Z923 Personal history of irradiation: Secondary | ICD-10-CM | POA: Diagnosis not present

## 2016-07-30 DIAGNOSIS — F419 Anxiety disorder, unspecified: Secondary | ICD-10-CM | POA: Diagnosis present

## 2016-07-30 DIAGNOSIS — E876 Hypokalemia: Secondary | ICD-10-CM | POA: Diagnosis present

## 2016-07-30 DIAGNOSIS — F410 Panic disorder [episodic paroxysmal anxiety] without agoraphobia: Secondary | ICD-10-CM | POA: Diagnosis present

## 2016-07-30 DIAGNOSIS — E860 Dehydration: Secondary | ICD-10-CM | POA: Diagnosis present

## 2016-07-30 DIAGNOSIS — E139 Other specified diabetes mellitus without complications: Secondary | ICD-10-CM | POA: Diagnosis not present

## 2016-07-30 DIAGNOSIS — C711 Malignant neoplasm of frontal lobe: Secondary | ICD-10-CM | POA: Diagnosis not present

## 2016-07-30 DIAGNOSIS — C719 Malignant neoplasm of brain, unspecified: Secondary | ICD-10-CM | POA: Diagnosis present

## 2016-07-30 DIAGNOSIS — I951 Orthostatic hypotension: Secondary | ICD-10-CM | POA: Diagnosis present

## 2016-07-30 DIAGNOSIS — E039 Hypothyroidism, unspecified: Secondary | ICD-10-CM | POA: Diagnosis present

## 2016-07-30 DIAGNOSIS — Z885 Allergy status to narcotic agent status: Secondary | ICD-10-CM | POA: Diagnosis not present

## 2016-07-30 DIAGNOSIS — Z803 Family history of malignant neoplasm of breast: Secondary | ICD-10-CM | POA: Diagnosis not present

## 2016-07-30 DIAGNOSIS — K219 Gastro-esophageal reflux disease without esophagitis: Secondary | ICD-10-CM | POA: Diagnosis present

## 2016-07-30 DIAGNOSIS — Z882 Allergy status to sulfonamides status: Secondary | ICD-10-CM | POA: Diagnosis not present

## 2016-07-30 DIAGNOSIS — M797 Fibromyalgia: Secondary | ICD-10-CM | POA: Diagnosis present

## 2016-07-30 LAB — GLUCOSE, CAPILLARY
GLUCOSE-CAPILLARY: 107 mg/dL — AB (ref 65–99)
Glucose-Capillary: 79 mg/dL (ref 65–99)
Glucose-Capillary: 69 mg/dL (ref 65–99)
Glucose-Capillary: 75 mg/dL (ref 65–99)
Glucose-Capillary: 137 mg/dL — ABNORMAL HIGH (ref 65–99)

## 2016-07-30 LAB — BASIC METABOLIC PANEL
Anion gap: 5 (ref 5–15)
BUN: 12 mg/dL (ref 6–20)
CHLORIDE: 115 mmol/L — AB (ref 101–111)
CO2: 21 mmol/L — AB (ref 22–32)
CREATININE: 0.78 mg/dL (ref 0.44–1.00)
Calcium: 8 mg/dL — ABNORMAL LOW (ref 8.9–10.3)
GFR calc Af Amer: >60 mL/min (ref >60)
GFR calc non Af Amer: >60 mL/min (ref >60)
Glucose, Bld: 87 mg/dL (ref 65–99)
Potassium: 3.9 mmol/L (ref 3.5–5.1)
SODIUM: 141 mmol/L (ref 135–145)

## 2016-07-30 LAB — CBC
HCT: 40.8 % (ref 36.0–46.0)
Hemoglobin: 14 g/dL (ref 12.0–15.0)
MCH: 32.4 pg (ref 26.0–34.0)
MCHC: 34.3 g/dL (ref 30.0–36.0)
MCV: 94.4 fL (ref 78.0–100.0)
PLATELETS: 253 10*3/uL (ref 150–400)
RBC: 4.32 MIL/uL (ref 3.87–5.11)
RDW: 12.8 % (ref 11.5–15.5)
WBC: 8.2 10*3/uL (ref 4.0–10.5)

## 2016-07-30 LAB — TSH: TSH: 1.9 u[IU]/mL (ref 0.350–4.500)

## 2016-07-30 LAB — TROPONIN I: Troponin I: <0.03 ng/mL (ref <0.03)

## 2016-07-30 NOTE — Evaluation (Signed)
Physical Therapy Evaluation Patient Details Name: Michelle Duran MRN: [id number] DOB: [birth year] Today's Date: 07/30/2016   History of Present Illness  60 yo female admittd with syncope and collapse. Hx of R craniotomy 08/2015.   Clinical Impression  On eval, pt required Max assist for bed mobility and Min assist for standing and ambulation. LOB x 1 during session. Pt is unsteady and remains at risk for falls. Noted pt to have decreased safety awareness during session. Pt's mother was present during session. Pt reports her mother is unable to physically assist her. Currently, pt cannot safely mobilize without assistance. Recommend ST rehab at SNF.     Follow Up Recommendations SNF    Equipment Recommendations   (continuing to assess)    Recommendations for Other Services OT consult     Precautions / Restrictions Precautions Precautions: Fall Restrictions Weight Bearing Restrictions: No      Mobility  Bed Mobility Overal bed mobility: Needs Assistance Bed Mobility: Supine to Sit     Supine to sit: Max assist     General bed mobility comments: Assist for trunk and bil LEs. Multimodal cueing for safety, technique.   Transfers Overall transfer level: Needs assistance Equipment used: Rolling walker (2 wheeled);None Transfers: Sit to/from [employer] transfer comment: Assist to rise, stabilize, control descent. VCs safety, technique. Stand pivot, bed to bsc, without device. LOB posteriorly requiring external assist to prevent fall  Ambulation/Gait Ambulation/Gait assistance: Min assist Ambulation Distance (Feet): 25 Feet Assistive device: Rolling walker (2 wheeled) Gait Pattern/deviations: Step-through pattern;Decreased stride length;Decreased step length - left     General Gait Details: Assist to stabilize pt and maneuver safely with walker. Pt had some difficulty with walker use since she doesn't normally use one however, she is  unsteady and requires external support.   Stairs            Wheelchair Mobility    Modified Rankin (Stroke Patients Only)       Balance Overall balance assessment: Needs assistance;History of Falls     Sitting balance - Comments: Noted some leaning to R side with inital sitting at EOB.      Standing balance-Leahy Scale: Poor Standing balance comment: LOB x1 during session requiring external assistance to prevent fall                             Pertinent Vitals/Pain Pain Assessment: No/denies pain    Home Living Family/patient expects to be discharged to:: Private residence Living Arrangements: Parent (mother is in her 59s) Available Help at Discharge: Family;Available 24 hours/day (however pt's mother cannot physically assist her) Type of Home:  (townhome) Home Access: Stairs to enter Entrance Stairs-Rails: None Entrance Stairs-Number of Steps: 3 Home Layout: Two level;Bed/bath upstairs Home Equipment: Walker - 2 wheels;Cane - single point;Bedside commode      Prior Function Level of Independence: Independent               Hand Dominance        Extremity/Trunk Assessment   Upper Extremity Assessment: Generalized weakness           Lower Extremity Assessment: Generalized weakness      Cervical / Trunk Assessment: Normal  Communication   Communication: No difficulties  Cognition Arousal/Alertness: Awake/alert     Area of Impairment: Safety/judgement         Safety/Judgement: Decreased awareness of safety  General Comments      Exercises     Assessment/Plan    PT Assessment Patient needs continued PT services  PT Problem List Decreased strength;Decreased mobility;Decreased activity tolerance;Decreased balance;Decreased knowledge of use of DME;Decreased cognition;Decreased safety awareness          PT Treatment Interventions DME instruction;Gait training;Therapeutic activities;Therapeutic  exercise;Patient/family education;Functional mobility training;Balance training    PT Goals (Current goals can be found in the Care Plan section)  Acute Rehab PT Goals Patient Stated Goal: to get stronger, better PT Goal Formulation: With patient/family Time For Goal Achievement: 08/13/16 Potential to Achieve Goals: Good    Frequency Min 3X/week   Barriers to discharge        Co-evaluation               End of Session Equipment Utilized During Treatment: Gait belt Activity Tolerance: Patient tolerated treatment well Patient left: in chair;with call bell/phone within reach;with family/visitor present           Time: 1432-1509 PT Time Calculation (min) (ACUTE ONLY): 37 min   Charges:   PT Evaluation $PT Eval Low Complexity: 1 Procedure PT Treatments $Gait Training: 8-22 mins   PT G Codes:        Weston Anna, MPT Pager: [phone]

## 2016-07-30 NOTE — Progress Notes (Addendum)
   07/30/16 1519  PT Time Calculation  PT Start Time (ACUTE ONLY) 1432  PT Stop Time (ACUTE ONLY) 1509  PT Time Calculation (min) (ACUTE ONLY) 37 min  PT G-Codes **NOT FOR INPATIENT CLASS**  Functional Assessment Tool Used clinical judgement  Functional Limitation Mobility: Walking and moving around  Mobility: Walking and Moving Around Current Status [id number]) CL  Mobility: Walking and Moving Around Goal Status [id number]) CI  PT General Charges  $$ ACUTE PT VISIT 1 Procedure  PT Evaluation  $PT Eval Low Complexity 1 Procedure  PT Treatments  $Gait Training 8-22 mins   Weston Anna, MPT [phone]

## 2016-07-30 NOTE — Progress Notes (Signed)
PROGRESS NOTE    Michelle Duran  [id number] DOB: 05/19/1956 DOA: 07/29/2016 PCP: Reginia Naas, MD    Brief Narrative: Michelle Duran is a 60 y.o. 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  Repeat orthostatics negative.  Cardiac enzymes negative.  Echocardiogram ordered.  tsh wnl.  No signs of infection.  Continue hydration for another 24 hours.    Hypokalemia: replete as needed repeat in am is normal.    Glioblastoma multiforme: On chemo, next one scheduled on Thursday.  MRI brain showed progression of the non enhancing tumour.  Discussed with Dr Lindi Adie, recommended to follow up with him on Thursday.  Resume decadron .   Hypothyroidism: resume synthroid.    Seizure disorder:  Resume keppra.     DVT prophylaxis: (scd's) Code Status: (Full/) Family Communication: family at bedside.  Disposition Plan: d/c in am after echo is done.    Consultants:   None.   Discussed with Dr Lindi Adie over the phone.    Procedures: MRI brain.   Echocardiogram.    Antimicrobials: none.    Subjective: No new complaints.   Objective: Vitals:   07/29/16 2100 07/29/16 2101 07/30/16 0653 07/30/16 1320  BP: (!) 218/103 (!) 191/81 (!) 162/83 (!) 148/86  Pulse:   (!) 50 (!) 47  Resp:   18 16  Temp:   98.5 F (36.9 C)   TempSrc:   Oral   SpO2:   98% 98%  Weight:      Height:        Intake/Output Summary (Last 24 hours) at 07/30/16 1928 Last data filed at 07/30/16 [id number]  Gross per 24 hour  Intake          2231.67 ml  Output              400 ml  Net           1831.67 ml   Filed Weights   07/29/16 1536  Weight: 80.7 kg (178 lb)    Examination:  General exam: Appears calm and comfortable  Respiratory system: Clear to auscultation. Respiratory effort normal. Cardiovascular system: S1 & S2 heard, RRR. No JVD, murmurs, rubs, gallops or clicks. No pedal edema. Gastrointestinal system: Abdomen is nondistended, soft and nontender. No organomegaly or masses felt. Normal bowel sounds heard. Central nervous system: Alert and oriented. No focal neurological deficits. Extremities: Symmetric 5 x 5 power. Skin: No rashes, lesions or ulcers     Data Reviewed: I have personally reviewed following labs and imaging studies  CBC:  Recent Labs Lab 07/29/16 1555 07/30/16 0347  WBC 12.2* 8.2  HGB 16.2* 14.0  HCT 47.1* 40.8  MCV 94.6 94.4  PLT [id number]   Basic Metabolic Panel:  Recent Labs Lab 07/29/16 1555 07/29/16 2130 07/30/16 0347  NA 140  --  141  K 2.9* 3.7 3.9  CL 106  --  115*  CO2 23  --  21*  GLUCOSE 137*  --  87  BUN 23*  --  12  CREATININE 1.27*  --  0.78  CALCIUM 9.0  --  8.0*  MG 1.8  --   --  GFR: Estimated Creatinine Clearance: 76.1 mL/min (by C-G formula based on SCr of 0.78 mg/dL). Liver Function Tests: No results for input(s): AST, ALT, ALKPHOS, BILITOT, PROT, ALBUMIN in the last 168 hours. No results for input(s): LIPASE, AMYLASE in the last 168 hours. No results for input(s): AMMONIA in the last 168 hours. Coagulation Profile: No results for input(s): INR, PROTIME in the last 168 hours. Cardiac Enzymes:  Recent Labs Lab 07/29/16 2110 07/30/16 0347  TROPONINI <0.03 <0.03   BNP (last 3 results) No results for input(s): PROBNP in the last 8760 hours. HbA1C: No results for input(s): HGBA1C in the last 72 hours. CBG:  Recent Labs Lab 07/29/16 1538 07/30/16 0814 07/30/16 1210 07/30/16 1222 07/30/16 1737  GLUCAP 148* 79 69 75 137*   Lipid Profile: No results for input(s): CHOL, HDL, LDLCALC, TRIG,  CHOLHDL, LDLDIRECT in the last 72 hours. Thyroid Function Tests:  Recent Labs  07/30/16 0347  TSH 1.900   Anemia Panel: No results for input(s): VITAMINB12, FOLATE, FERRITIN, TIBC, IRON, RETICCTPCT in the last 72 hours. Sepsis Labs: No results for input(s): PROCALCITON, LATICACIDVEN in the last 168 hours.  No results found for this or any previous visit (from the past 240 hour(s)).       Radiology Studies: X-ray Chest Pa And Lateral  Result Date: 07/29/2016 CLINICAL DATA:  Brain cancer history of chemotherapy with syncopal episode EXAM: CHEST  2 VIEW COMPARISON:  06/11/2004 FINDINGS: Right-sided central venous port tip overlies the proximal right atrium. There are low lung volumes bilaterally. Mild bibasilar atelectasis. Mild enlargement of the cardiomediastinal silhouette is augmented by low lung volume. No pneumothorax. IMPRESSION: 1. Low lung volumes 2. Mild enlargement of the cardiomediastinal none, partially related to low lung volumes 3. Streaky atelectasis within the bilateral lung bases. Electronically Signed   By: Donavan Foil M.D.   On: 07/29/2016 23:43   Mr Jeri Cos And Wo Contrast  Result Date: 07/29/2016 CLINICAL DATA:  Initial evaluation for syncope.  History of GBM. EXAM: MRI HEAD WITHOUT AND WITH CONTRAST TECHNIQUE: Multiplanar, multiecho pulse sequences of the brain and surrounding structures were obtained without and with intravenous contrast. CONTRAST:  73mL MULTIHANCE GADOBENATE DIMEGLUMINE 529 MG/ML IV SOLN COMPARISON:  Prior MRI from 05/20/2016. FINDINGS: Brain: Postoperative changes from previous right frontotemporal craniotomy noted. Patient's known GBM again seen. At the level of the right temporal lobe, lesion is overall similar in size and appearance measuring 30 mm in maximal diameter (series 14, image 16), not significantly changed from previous. Patchy post-contrast enhancement about the resection cavity within knee right frontal lobe the sulcal relatively  similar measuring approximately 24 x 18 mm (Series 13, image 40). Small focus of nodular enhancement extending posterior to this region also similar (series 13, image 40). Enhancing tumor at the left parasagittal frontal region is stable to slightly more prominent measuring 8 mm (series 14, image 22). Additional patchy enhancement within the cortical/subcortical region of the anterior parasagittal right frontal lobe also stable to slightly more prominent (series 13, image 41). Additional patchy enhancement within the parasagittal right frontal lobe slightly more inferiorly measures 11 mm, similar to previous (series 13, image 35). No new enhancing tumor identified. Extensive associated T2/FLAIR signal abnormality involving the right greater than left cerebral hemispheres has worsened as compared to previous exam, concerning for progressive infiltrative nonenhancing tumor. This is most evident within the parasagittal frontal lobes bilaterally (series 7, image 15). Additionally, there are new focal areas of diffusion abnormality within this region, also compatible with disease  progression (series 11, image 33, 28). Diffusion abnormality about the resection site is similar to previous. Extension into the brainstem again noted, similar to previous. No acute vascular infarct. No midline shift or significant mass effect. Ventricle size is stable without hydrocephalus. No extra-axial fluid collection. Dural thickening and enhancement overlying the right cerebral hemisphere noted, likely postoperative in nature, similar to previous. Vascular: Major intracranial vascular flow voids are maintained. Skull and upper cervical spine: Craniocervical junction normal. Visualized upper cervical spine within normal limits. Bone marrow signal intensity normal. No acute scalp soft tissue abnormality. Sinuses/Orbits: Globes and orbital soft tissues within normal limits. Moderate mucosal thickening throughout the paranasal sinuses. No  air-fluid level to suggest active sinus infection. Bilateral mastoid effusions noted. Inner ear structures grossly normal. IMPRESSION: 1. Overall interval progression of disease as evidenced by worsened T2/FLAIR and diffusion signal abnormality involving the anterior cerebral hemispheres bilaterally, most likely reflecting infiltrative nonenhancing tumor. Enhancing portions of the tumor are relatively similar as compared to most recent MRI from 05/20/2016. 2. No other acute intracranial process identified. 3. Moderate inflammatory paranasal sinus disease with bilateral mastoid effusions. Electronically Signed   By: Jeannine Boga M.D.   On: 07/29/2016 21:25        Scheduled Meds: . bacitracin   Topical BID  . dexamethasone  2 mg Oral Daily  . insulin aspart  0-9 Units Subcutaneous TID WC  . insulin glargine  5 Units Subcutaneous QHS  . levETIRAcetam  1,500 mg Oral BID  . levothyroxine  12.5 mcg Oral QAC breakfast  . pantoprazole  40 mg Oral BID  . sertraline  150 mg Oral Daily  . sodium chloride flush  3 mL Intravenous Q12H   Continuous Infusions: . 0.9 % NaCl with KCl 40 mEq / L 125 mL/hr (07/29/16 2204)     LOS: 0 days    Time spent: 30 minutes.     Hosie Poisson, MD [facility] Pager [phone]  If 7PM-7AM, please contact night-coverage [email or web address] Password [facility] 07/30/2016, 7:28 PM

## 2016-07-31 ENCOUNTER — Inpatient Hospital Stay (HOSPITAL_COMMUNITY): Payer: BC Managed Care – PPO

## 2016-07-31 ENCOUNTER — Ambulatory Visit (HOSPITAL_COMMUNITY): Admission: RE | Admit: 2016-07-31 | Payer: BC Managed Care – PPO | Source: Ambulatory Visit

## 2016-07-31 DIAGNOSIS — E876 Hypokalemia: Secondary | ICD-10-CM

## 2016-07-31 DIAGNOSIS — R55 Syncope and collapse: Secondary | ICD-10-CM

## 2016-07-31 DIAGNOSIS — C711 Malignant neoplasm of frontal lobe: Secondary | ICD-10-CM

## 2016-07-31 DIAGNOSIS — I10 Essential (primary) hypertension: Secondary | ICD-10-CM

## 2016-07-31 DIAGNOSIS — R531 Weakness: Secondary | ICD-10-CM

## 2016-07-31 DIAGNOSIS — C719 Malignant neoplasm of brain, unspecified: Principal | ICD-10-CM

## 2016-07-31 DIAGNOSIS — K219 Gastro-esophageal reflux disease without esophagitis: Secondary | ICD-10-CM

## 2016-07-31 DIAGNOSIS — E860 Dehydration: Secondary | ICD-10-CM

## 2016-07-31 DIAGNOSIS — E139 Other specified diabetes mellitus without complications: Secondary | ICD-10-CM

## 2016-07-31 DIAGNOSIS — C712 Malignant neoplasm of temporal lobe: Secondary | ICD-10-CM

## 2016-07-31 DIAGNOSIS — I951 Orthostatic hypotension: Secondary | ICD-10-CM

## 2016-07-31 DIAGNOSIS — E039 Hypothyroidism, unspecified: Secondary | ICD-10-CM

## 2016-07-31 LAB — BASIC METABOLIC PANEL
ANION GAP: 6 (ref 5–15)
BUN: 6 mg/dL (ref 6–20)
CALCIUM: 8.5 mg/dL — AB (ref 8.9–10.3)
CHLORIDE: 109 mmol/L (ref 101–111)
CO2: 22 mmol/L (ref 22–32)
Creatinine, Ser: 0.72 mg/dL (ref 0.44–1.00)
GFR calc non Af Amer: >60 mL/min (ref >60)
GLUCOSE: 90 mg/dL (ref 65–99)
Potassium: 4.6 mmol/L (ref 3.5–5.1)
Sodium: 137 mmol/L (ref 135–145)

## 2016-07-31 LAB — GLUCOSE, CAPILLARY
GLUCOSE-CAPILLARY: 80 mg/dL (ref 65–99)
Glucose-Capillary: 123 mg/dL — ABNORMAL HIGH (ref 65–99)
Glucose-Capillary: 166 mg/dL — ABNORMAL HIGH (ref 65–99)
Glucose-Capillary: 149 mg/dL — ABNORMAL HIGH (ref 65–99)

## 2016-07-31 LAB — HEMOGLOBIN A1C
HEMOGLOBIN A1C: 6.8 % — AB (ref 4.8–5.6)
Mean Plasma Glucose: 148 mg/dL

## 2016-07-31 LAB — ECHOCARDIOGRAM COMPLETE
HEIGHTINCHES: 63 in
Weight: 2848 oz

## 2016-07-31 MED ORDER — SODIUM CHLORIDE 0.9% FLUSH
10.0000 mL | INTRAVENOUS | Status: DC | PRN
  Administered 2016-08-02: 10 mL
  Filled 2016-07-31: qty 40

## 2016-07-31 MED ORDER — SODIUM CHLORIDE 0.9% FLUSH
10.0000 mL | Freq: Two times a day (BID) | INTRAVENOUS | Status: DC
  Administered 2016-08-01: 3 mL
  Administered 2016-08-02: 10 mL

## 2016-07-31 NOTE — Progress Notes (Signed)
PROGRESS NOTE    Michelle Duran  [id number] DOB: Jun 06, 1956 DOA: 07/29/2016 PCP: Reginia Naas, MD   Brief Narrative:   Michelle Duran a 60 y.o.femalewith medical history significant of glioblastoma multiforme brain on chemotherapy, last dose was on Thursday, anxiety, seizure disorder, hypothyroidism, presented with generalized weakness, fall and loss of consciousness episode at home   Assessment & Plan:   Principal Problem:   Syncope and collapse Active Problems:   Glioblastoma multiforme of brain (HCC)   Orthostatic hypotension   Hypothyroidism   Essential hypertension   GERD   Depression   Diabetes mellitus, iatrogenic (HCC)   Hypokalemia  #1 syncope and collapse Likely secondary to glioblastoma multiforme and orthostatic hypotension. Clinical improvement with IV fluids. Repeat orthostatics negative. No further syncopal episodes. 2-D echo negative for any valvular stenosis. EF of 60-65% with no wall motion abnormalities. Grade 2 diastolic dysfunction. PT/OT. Follow.  #2 glioblastoma multiform of the brain MRI with and without contrast with overall interval progression of disease as evidenced by worsening T2/FLAIR and diffusion signal abnormality involving the anterior cerebral hemispheres bilaterally, most likely reflecting infiltrative nonenhancing tumor. No other acute intracranial process identified. Continue current dose of Decadron. Continue Keppra for seizure prophylaxis. Oncology consultation pending. Patient will likely need to reschedule appointment with oncology.  #3 orthostatic hypotension Improved with hydration.  #4 hypothyroidism TSH at 1.900. Continue home dose Synthroid.  #5 diabetes mellitus Hemoglobin A1c 6.8. CBGs have ranged from 80-166. Patient with a poor appetite and history of glioblastoma multiform he of the brain. Will liberalize patient's diet to a regular diet. Sliding scale insulin. Continue Lantus.  #6  hypokalemia Repleted.  #7 depression Stable. Continue Zoloft.  #8 gastroesophageal reflux disease  PPI.  #9 hypertension Blood pressure stable. Follow.  #10 seizure disorder Stable. No seizures noted. Continue Keppra.   DVT prophylaxis: SCDs Code Status: Full Family Communication: Updated patient. No family present. Disposition Plan: Skilled nursing facility when medically stable of late 24-48 hours.   Consultants:   Oncology pending  Procedures:   2-D echo 07/31/2016  Chest x-ray 07/29/2016  MRI head 07/29/2016  Antimicrobials:   None   Subjective: Patient denies any dizziness. No further syncopal episodes. Patient with some repetitive questions. No chest pain. No shortness of breath. Patient with poor appetite and frustrated she cannot get banana pudding.  Objective: Vitals:   07/30/16 2218 07/31/16 0436 07/31/16 1056 07/31/16 1554  BP: (!) 160/94 (!) 190/98 140/79 139/80  Pulse: (!) 45 (!) 49 (!) 53 (!) 56  Resp: 20 20 20 20   Temp: 98.1 F (36.7 C) 98.9 F (37.2 C) 98.2 F (36.8 C) 97.9 F (36.6 C)  TempSrc: Oral Oral Oral Oral  SpO2: 98% 98% 98% 95%  Weight:      Height:        Intake/Output Summary (Last 24 hours) at 07/31/16 1728 Last data filed at 07/31/16 0930  Gross per 24 hour  Intake             1760 ml  Output                0 ml  Net             1760 ml   Filed Weights   07/29/16 1536  Weight: 80.7 kg (178 lb)    Examination:  General exam: Appears calm and comfortable  Respiratory system: Clear to auscultation. Respiratory effort normal. Cardiovascular system: S1 & S2 heard, RRR. No JVD, murmurs, rubs, gallops or  clicks. No pedal edema. Gastrointestinal system: Abdomen is nondistended, soft and nontender. No organomegaly or masses felt. Normal bowel sounds heard. Central nervous system: Alert and oriented. No focal neurological deficits. Extremities: Symmetric 5 x 5 power. Skin: No rashes, lesions or ulcers Psychiatry:  Judgement and insight appear normal. Mood & affect appropriate.     Data Reviewed: I have personally reviewed following labs and imaging studies  CBC:  Recent Labs Lab 07/29/16 1555 07/30/16 0347  WBC 12.2* 8.2  HGB 16.2* 14.0  HCT 47.1* 40.8  MCV 94.6 94.4  PLT [id number]   Basic Metabolic Panel:  Recent Labs Lab 07/29/16 1555 07/29/16 2130 07/30/16 0347 07/31/16 0600  NA 140  --  141 137  K 2.9* 3.7 3.9 4.6  CL 106  --  115* 109  CO2 23  --  21* 22  GLUCOSE 137*  --  87 90  BUN 23*  --  12 6  CREATININE 1.27*  --  0.78 0.72  CALCIUM 9.0  --  8.0* 8.5*  MG 1.8  --   --   --    GFR: Estimated Creatinine Clearance: 76.1 mL/min (by C-G formula based on SCr of 0.72 mg/dL). Liver Function Tests: No results for input(s): AST, ALT, ALKPHOS, BILITOT, PROT, ALBUMIN in the last 168 hours. No results for input(s): LIPASE, AMYLASE in the last 168 hours. No results for input(s): AMMONIA in the last 168 hours. Coagulation Profile: No results for input(s): INR, PROTIME in the last 168 hours. Cardiac Enzymes:  Recent Labs Lab 07/29/16 2110 07/30/16 0347  TROPONINI <0.03 <0.03   BNP (last 3 results) No results for input(s): PROBNP in the last 8760 hours. HbA1C:  Recent Labs  07/29/16 1555  HGBA1C 6.8*   CBG:  Recent Labs Lab 07/30/16 1737 07/30/16 2223 07/31/16 0721 07/31/16 1149 07/31/16 1720  GLUCAP 137* 107* 80 123* 166*   Lipid Profile: No results for input(s): CHOL, HDL, LDLCALC, TRIG, CHOLHDL, LDLDIRECT in the last 72 hours. Thyroid Function Tests:  Recent Labs  07/30/16 0347  TSH 1.900   Anemia Panel: No results for input(s): VITAMINB12, FOLATE, FERRITIN, TIBC, IRON, RETICCTPCT in the last 72 hours. Sepsis Labs: No results for input(s): PROCALCITON, LATICACIDVEN in the last 168 hours.  No results found for this or any previous visit (from the past 240 hour(s)).       Radiology Studies: X-ray Chest Pa And Lateral  Result Date:  07/29/2016 CLINICAL DATA:  Brain cancer history of chemotherapy with syncopal episode EXAM: CHEST  2 VIEW COMPARISON:  06/11/2004 FINDINGS: Right-sided central venous port tip overlies the proximal right atrium. There are low lung volumes bilaterally. Mild bibasilar atelectasis. Mild enlargement of the cardiomediastinal silhouette is augmented by low lung volume. No pneumothorax. IMPRESSION: 1. Low lung volumes 2. Mild enlargement of the cardiomediastinal none, partially related to low lung volumes 3. Streaky atelectasis within the bilateral lung bases. Electronically Signed   By: Donavan Foil M.D.   On: 07/29/2016 23:43   Mr Jeri Cos And Wo Contrast  Result Date: 07/29/2016 CLINICAL DATA:  Initial evaluation for syncope.  History of GBM. EXAM: MRI HEAD WITHOUT AND WITH CONTRAST TECHNIQUE: Multiplanar, multiecho pulse sequences of the brain and surrounding structures were obtained without and with intravenous contrast. CONTRAST:  25mL MULTIHANCE GADOBENATE DIMEGLUMINE 529 MG/ML IV SOLN COMPARISON:  Prior MRI from 05/20/2016. FINDINGS: Brain: Postoperative changes from previous right frontotemporal craniotomy noted. Patient's known GBM again seen. At the level of the right temporal lobe, lesion  is overall similar in size and appearance measuring 30 mm in maximal diameter (series 14, image 16), not significantly changed from previous. Patchy post-contrast enhancement about the resection cavity within knee right frontal lobe the sulcal relatively similar measuring approximately 24 x 18 mm (Series 13, image 40). Small focus of nodular enhancement extending posterior to this region also similar (series 13, image 40). Enhancing tumor at the left parasagittal frontal region is stable to slightly more prominent measuring 8 mm (series 14, image 22). Additional patchy enhancement within the cortical/subcortical region of the anterior parasagittal right frontal lobe also stable to slightly more prominent (series 13,  image 41). Additional patchy enhancement within the parasagittal right frontal lobe slightly more inferiorly measures 11 mm, similar to previous (series 13, image 35). No new enhancing tumor identified. Extensive associated T2/FLAIR signal abnormality involving the right greater than left cerebral hemispheres has worsened as compared to previous exam, concerning for progressive infiltrative nonenhancing tumor. This is most evident within the parasagittal frontal lobes bilaterally (series 7, image 15). Additionally, there are new focal areas of diffusion abnormality within this region, also compatible with disease progression (series 11, image 33, 28). Diffusion abnormality about the resection site is similar to previous. Extension into the brainstem again noted, similar to previous. No acute vascular infarct. No midline shift or significant mass effect. Ventricle size is stable without hydrocephalus. No extra-axial fluid collection. Dural thickening and enhancement overlying the right cerebral hemisphere noted, likely postoperative in nature, similar to previous. Vascular: Major intracranial vascular flow voids are maintained. Skull and upper cervical spine: Craniocervical junction normal. Visualized upper cervical spine within normal limits. Bone marrow signal intensity normal. No acute scalp soft tissue abnormality. Sinuses/Orbits: Globes and orbital soft tissues within normal limits. Moderate mucosal thickening throughout the paranasal sinuses. No air-fluid level to suggest active sinus infection. Bilateral mastoid effusions noted. Inner ear structures grossly normal. IMPRESSION: 1. Overall interval progression of disease as evidenced by worsened T2/FLAIR and diffusion signal abnormality involving the anterior cerebral hemispheres bilaterally, most likely reflecting infiltrative nonenhancing tumor. Enhancing portions of the tumor are relatively similar as compared to most recent MRI from 05/20/2016. 2. No other  acute intracranial process identified. 3. Moderate inflammatory paranasal sinus disease with bilateral mastoid effusions. Electronically Signed   By: Jeannine Boga M.D.   On: 07/29/2016 21:25        Scheduled Meds: . bacitracin   Topical BID  . dexamethasone  2 mg Oral Daily  . insulin aspart  0-9 Units Subcutaneous TID WC  . insulin glargine  5 Units Subcutaneous QHS  . levETIRAcetam  1,500 mg Oral BID  . levothyroxine  12.5 mcg Oral QAC breakfast  . pantoprazole  40 mg Oral BID  . sertraline  150 mg Oral Daily  . sodium chloride flush  10-40 mL Intracatheter Q12H  . sodium chloride flush  3 mL Intravenous Q12H   Continuous Infusions:   LOS: 1 day    Time spent: 79 mins    Yeny Schmoll, MD [facility] Pager [phone]  If 7PM-7AM, please contact night-coverage [email or web address] Password [facility] 07/31/2016, 5:28 PM

## 2016-07-31 NOTE — Progress Notes (Signed)
HEMATOLOGY-ONCOLOGY PROGRESS NOTE  SUBJECTIVE: Admitted with syncope, and gen weakness Patient with advanced glioblastoma multiform a admitted with syncope and generalized weakness. She has been treated as an outpatient initially on temozolomide but she experienced progression and was being treated with Avastin every 2 weeks since Shai 2017. Her previous MRI scans have been relatively stable. In the hospital repeat MRI suggests progression of disease. She is hoping to be discharged to a rehabilitation unit in the next 1-2 days.    Glioblastoma multiforme of brain ([place])   07/26/2015 Imaging    Brain MRI: Heterogeneous enhancing right frontal lobe mass 4.7 x 3.6 x 3.3 cm with vasogenic edema, heterogeneous enhancing mid right temporal lobe mass 4.1 x 3.1 x 2.2 cm with edema and right-to-left shift and mass effect      08/09/2015 Initial Diagnosis    Tumor resection right frontal (2.3 cm) and temporal lobes (2.4 cm): Glioblastoma WHO grade 4 (IDH1/IDH2 Mutations Not detected)      08/29/2015 - 10/11/2015 Radiation Therapy    Concurrent radiation with Temodar      10/23/2015 Imaging    Brain MRI: Enhancement right temporal lobe cavity 4.3 x 3.7 cm previously 3.3 cm, second right frontal lobe nodule 3 x 3.3 cm, previously 2.9 cm, right mesial frontal lobe nodule 11 mm.      12/14/2015 -  Chemotherapy    Avastin 10 mg/kg every 2 weeks      05/21/2016 Imaging    MRI brain: Overall no significant interval change in the multifocal areas of enhancement within the right frontal and temporal regions as well as the parasagittal frontal lobes bilaterally compared to 03/25/2016       OBJECTIVE: REVIEW OF SYSTEMS:   Constitutional: Denies fevers, chills or abnormal weight loss Eyes: Denies blurriness of vision Ears, nose, mouth, throat, and face: Hearing impairment Respiratory: Denies cough, dyspnea or wheezes Cardiovascular: Denies palpitation, chest discomfort Gastrointestinal:  Denies nausea,  heartburn or change in bowel habits Skin: Denies abnormal skin rashes Lymphatics: Denies new lymphadenopathy or easy bruising Neurological generalized weakness Behavioral/Psych: Mood is stable, no new changes  Extremities: No lower extremity edema All other systems were reviewed with the patient and are negative.  I have reviewed the past medical history, past surgical history, social history and family history with the patient and they are unchanged from previous note.   PHYSICAL EXAMINATION: ECOG PERFORMANCE STATUS: 3 - Symptomatic, >50% confined to bed  Vitals:   07/31/16 1056 07/31/16 1554  BP: 140/79 139/80  Pulse: (!) 53 (!) 56  Resp: 20 20  Temp: 98.2 F (36.8 C) 97.9 F (36.6 C)   Filed Weights   07/29/16 1536  Weight: 178 lb (80.7 kg)    GENERAL:alert, no distress and comfortable SKIN: skin color, texture, turgor are normal, no rashes or significant lesions EYES: normal, Conjunctiva are pink and non-injected, sclera clear OROPHARYNX:no exudate, no erythema and lips, buccal mucosa, and tongue normal  NECK: supple, thyroid normal size, non-tender, without nodularity LYMPH:  no palpable lymphadenopathy in the cervical, axillary or inguinal LUNGS: clear to auscultation and percussion with normal breathing effort HEART: regular rate & rhythm and no murmurs and no lower extremity edema ABDOMEN:abdomen soft, non-tender and normal bowel sounds Musculoskeletal:no cyanosis of digits and no clubbing  NEURO: alert & oriented x 3 with fluent speech, no focal motor/sensory deficits, surgical changes in the scalp from prior brain surgery  LABORATORY DATA:  I have reviewed the data as listed CMP Latest Ref Rng & Units  07/31/2016 07/30/2016 07/29/2016  Glucose 65 - 99 mg/dL 90 87 -  BUN 6 - 20 mg/dL 6 12 -  Creatinine 0.44 - 1.00 mg/dL 0.72 0.78 -  Sodium 135 - 145 mmol/L 137 141 -  Potassium 3.5 - 5.1 mmol/L 4.6 3.9 3.7  Chloride 101 - 111 mmol/L 109 115(H) -  CO2 22 - 32  mmol/L 22 21(L) -  Calcium 8.9 - 10.3 mg/dL 8.5(L) 8.0(L) -  Total Protein 6.4 - 8.3 g/dL - - -  Total Bilirubin 0.20 - 1.20 mg/dL - - -  Alkaline Phos 40 - 150 U/L - - -  AST 5 - 34 U/L - - -  ALT 0 - 55 U/L - - -    Lab Results  Component Value Date   WBC 8.2 07/30/2016   HGB 14.0 07/30/2016   HCT 40.8 07/30/2016   MCV 94.4 07/30/2016   PLT 253 07/30/2016   NEUTROABS 8.6 (H) 07/18/2016    ASSESSMENT AND PLAN: 1. Relapsed/recurrent glioblastoma: Currently on Avastin therapy. I reviewed the MRI with the patient in great detail. We will be presenting her in the brain tumor board next Monday. The MRI suggests that there may be nonenhancing progression in the anterior cerebral hemispheres bilaterally. The remainder of the brain MRI shows stable findings. I would like the brain tumor board to verify that this truly should be classified as progression.  Unfortunately for the patient if this is a true progression, there are not that many options. We may consider adding temozolomide to Avastin. The other option is to increase the dosage of Avastin.  Because the patient is in the hospital, we are unable to give her regularly scheduled Avastin treatment which is scheduled for tomorrow. Once she gets discharged we will set her up for her next Avastin therapy until we have the final treatment plan.  Thank you very much for allowing [place] to participate in her care.

## 2016-07-31 NOTE — Progress Notes (Signed)
  Echocardiogram 2D Echocardiogram has been performed.  Jennette Dubin 07/31/2016, 10:55 AM

## 2016-07-31 NOTE — NC FL2 (Signed)
[place] LEVEL OF CARE SCREENING TOOL     IDENTIFICATION  Patient Name: Michelle Duran Birthdate: 1955/09/14 Sex: female Admission Date (Current Location): 07/29/2016  [facility] and [place] Number:  Herbalist and Address:  [facility],  [place] [address], [place]      Provider Number: [id number]  Attending Physician Name and Address:  Eugenie Filler, MD  Relative Name and Phone Number:       Current Level of Care: SNF Recommended Level of Care: [place] Prior Approval Number:    Date Approved/Denied:   PASRR Number: [id number]  Discharge Plan: SNF    Current Diagnoses: Patient Active Problem List   Diagnosis Date Noted  . Syncope and collapse 07/29/2016  . Orthostatic hypotension 07/29/2016  . Palpitations 03/11/2016  . Petechial rash 01/12/2016  . Peripheral edema 01/12/2016  . Hypokalemia 01/11/2016  . Seizures ([place])   . DNR (do not resuscitate) 11/10/2015  . Palliative care encounter 11/10/2015  . Dental abscess 10/11/2015  . Brain neoplasm ([place]) 08/09/2015  . Glioblastoma multiforme of brain ([place])   . Depression   . Anxiety state   . Tobacco abuse   . Diabetes mellitus, iatrogenic ([place])   . Brain mass 07/24/2015  . Facial droop 07/24/2015  . Slurred speech 07/24/2015  . Hypothyroidism 06/17/2008  . GERD 06/17/2008  . IRRITABLE BOWEL SYNDROME 06/17/2008  . ANAL FISSURE, HX OF 06/17/2008  . PANIC DISORDER 11/05/2007  . MIGRAINE HEADACHE 11/05/2007  . Essential hypertension 11/05/2007  . 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 CARDIAC ARRHYTHMIA 11/05/2007  . RECTAL FISSURE 11/05/2007  . FIBROMYALGIA 11/05/2007  . HIATAL HERNIA 05/08/2001    Orientation RESPIRATION BLADDER Height & Weight     Self, Time, Situation, Place  Normal Continent Weight: 178 lb (80.7 kg) Height:  5\' 3"  (160 cm)  BEHAVIORAL SYMPTOMS/MOOD NEUROLOGICAL BOWEL NUTRITION STATUS      Continent  (Regular)  AMBULATORY  STATUS COMMUNICATION OF NEEDS Skin   Extensive Assist Verbally Skin abrasions                       Personal Care Assistance Level of Assistance  Feeding, Dressing, Bathing Bathing Assistance: Limited assistance Feeding assistance: Independent Dressing Assistance: Limited assistance     Functional Limitations Info  Sight, Hearing, Speech Sight Info: Adequate Hearing Info: Adequate Speech Info: Adequate    SPECIAL CARE FACTORS FREQUENCY  PT (By licensed PT), OT (By licensed OT)     PT Frequency: 5 OT Frequency: 5            Contractures      Additional Factors Info  Code Status, Allergies Code Status Info: FULL Allergies Info: Codeine, Prednisone, Sulfonamide Derivatives           Current Medications (07/31/2016):  This is the current hospital active medication list Current Facility-Administered Medications  Medication Dose Route Frequency Provider Last Rate Last Dose  . acetaminophen (TYLENOL) tablet 650 mg  650 mg Oral Q6H PRN Dron Tanna Furry, MD       Or  . acetaminophen (TYLENOL) suppository 650 mg  650 mg Rectal Q6H PRN Dron Tanna Furry, MD      . alum & mag hydroxide-simeth (MAALOX/MYLANTA) 200-200-20 MG/5ML suspension 30 mL  30 mL Oral Q6H PRN Dron Tanna Furry, MD      . bacitracin ointment   Topical BID Leo Grosser, MD   0000000 application at [id number]  . benzonatate (  TESSALON) capsule 100 mg  100 mg Oral TID PRN Dron Tanna Furry, MD      . dexamethasone (DECADRON) tablet 2 mg  2 mg Oral Daily Dron Tanna Furry, MD   2 mg at 07/31/16 1023  . insulin aspart (novoLOG) injection 0-9 Units  0-9 Units Subcutaneous TID WC Dron Tanna Furry, MD   1 Units at 07/30/16 1806  . insulin glargine (LANTUS) injection 5 Units  5 Units Subcutaneous QHS Dron Tanna Furry, MD   5 Units at 07/30/16 2141  . levETIRAcetam (KEPPRA) tablet 1,500 mg  1,500 mg Oral BID Dron Tanna Furry, MD   1,500 mg at 07/31/16 1023  . levothyroxine  (SYNTHROID, LEVOTHROID) tablet 12.5 mcg  12.5 mcg Oral QAC breakfast Dron Tanna Furry, MD   12.5 mcg at 07/30/16 1022  . LORazepam (ATIVAN) injection 1 mg  1 mg Intravenous Q2H PRN Berton Mount, RPH      . ondansetron (ZOFRAN) tablet 4 mg  4 mg Oral Q6H PRN Dron Tanna Furry, MD       Or  . ondansetron [facility]) injection 4 mg  4 mg Intravenous Q6H PRN Dron Tanna Furry, MD      . pantoprazole (PROTONIX) EC tablet 40 mg  40 mg Oral BID Dron Tanna Furry, MD   40 mg at 07/31/16 1024  . sertraline (ZOLOFT) tablet 150 mg  150 mg Oral Daily Dron Tanna Furry, MD   150 mg at 07/31/16 1023  . sodium chloride flush (NS) 0.9 % injection 10-40 mL  10-40 mL Intracatheter Q12H Eugenie Filler, MD      . sodium chloride flush (NS) 0.9 % injection 10-40 mL  10-40 mL Intracatheter PRN Eugenie Filler, MD      . sodium chloride flush (NS) 0.9 % injection 3 mL  3 mL Intravenous Q12H Dron Tanna Furry, MD   3 mL at 07/30/16 2141   Facility-Administered Medications Ordered in Other Encounters  Medication Dose Route Frequency Provider Last Rate Last Dose  . SONAFINE emulsion 1 application  1 application Topical BID Tyler Pita, MD   1 application at [id number]     Discharge Medications: Please see discharge summary for a list of discharge medications.  Relevant Imaging Results:  Relevant Lab Results:   Additional Information [id number]  Lia Hopping, LCSW

## 2016-07-31 NOTE — Progress Notes (Signed)
Physical Therapy Treatment Patient Details Name: Michelle Duran MRN: [id number] DOB: 04-06-1956 Today's Date: 07/31/2016    History of Present Illness 60 yo female admittd with syncope and collapse. Hx of R craniotomy 08/2015.     PT Comments    Assisted OOB to [facility] pt required increased time and a lot functional repeated VC's to complete task and to increase safety.  Pt easily distracted and impulsive.  amb a limited distance.  Very unsteady gait.  Poor self correction to midline and delayed corrective response to balance.  Pt wll need ST Rehab at SNF prior to returning home.   Follow Up Recommendations  SNF     Equipment Recommendations       Recommendations for Other Services       Precautions / Restrictions Precautions Precautions: Fall Precaution Comments: S/P Craniotomy 08/2015 Restrictions Weight Bearing Restrictions: No    Mobility  Bed Mobility Overal bed mobility: Needs Assistance Bed Mobility: Supine to Sit     Supine to sit: Min assist     General bed mobility comments: repeat functional VC's to complete task (easily distracted/hyper talkative) and VC's for safety/judgement  Transfers Overall transfer level: Needs assistance Equipment used: Rolling walker (2 wheeled);None Transfers: Sit to/from [facility] Sit to Stand: Mod assist Stand pivot transfers: Mod assist       General transfer comment: Assist to rise, stabilize, control descent. VCs safety, technique. Stand pivot, bed to bsc, without device. LOB posteriorly requiring external assist to prevent fall  Ambulation/Gait Ambulation/Gait assistance: Min assist;Mod assist Ambulation Distance (Feet): 28 Feet Assistive device: Rolling walker (2 wheeled) Gait Pattern/deviations: Step-to pattern;Step-through pattern;Drifts right/left;Trunk flexed;Decreased stance time - left;Decreased step length - right Gait velocity: too quick, VC's to decrease gait speed to increase safety    General Gait Details: vary unstaedy "drunken" gait with poor self correction to midline and delayed corrective reaction to balance.   HIGH FALL RISK.    Stairs            Wheelchair Mobility    Modified Rankin (Stroke Patients Only)       Balance                                    Cognition Arousal/Alertness: Awake/alert   Overall Cognitive Status: Within Functional Limits for tasks assessed Area of Impairment: Safety/judgement         Safety/Judgement: Decreased awareness of safety     General Comments: impulsive    Exercises      General Comments        Pertinent Vitals/Pain Pain Assessment: Faces Faces Pain Scale: Hurts a little bit Pain Location: back Pain Descriptors / Indicators: Discomfort Pain Intervention(s): Monitored during session;Repositioned    Home Living                      Prior Function            PT Goals (current goals can now be found in the care plan section) Progress towards PT goals: Progressing toward goals    Frequency    Min 3X/week      PT Plan Current plan remains appropriate    Co-evaluation             End of Session Equipment Utilized During Treatment: Gait belt Activity Tolerance: Patient tolerated treatment well Patient left: in chair;with call bell/phone within reach;with family/visitor present  Time: 1214-1228 PT Time Calculation (min) (ACUTE ONLY): 14 min  Charges:  $Gait Training: 8-22 mins                    G Codes:      Rica Koyanagi  PTA [facility]  Acute  Rehab Pager      [phone]

## 2016-08-01 ENCOUNTER — Encounter: Payer: Self-pay | Admitting: Nurse Practitioner

## 2016-08-01 ENCOUNTER — Other Ambulatory Visit: Payer: Self-pay

## 2016-08-01 ENCOUNTER — Ambulatory Visit: Payer: Self-pay

## 2016-08-01 ENCOUNTER — Telehealth: Payer: Self-pay | Admitting: *Deleted

## 2016-08-01 LAB — GLUCOSE, CAPILLARY
GLUCOSE-CAPILLARY: 111 mg/dL — AB (ref 65–99)
GLUCOSE-CAPILLARY: 112 mg/dL — AB (ref 65–99)
GLUCOSE-CAPILLARY: 129 mg/dL — AB (ref 65–99)
Glucose-Capillary: 135 mg/dL — ABNORMAL HIGH (ref 65–99)

## 2016-08-01 LAB — BASIC METABOLIC PANEL
ANION GAP: 8 (ref 5–15)
BUN: 7 mg/dL (ref 6–20)
CALCIUM: 7.8 mg/dL — AB (ref 8.9–10.3)
CO2: 21 mmol/L — ABNORMAL LOW (ref 22–32)
Chloride: 110 mmol/L (ref 101–111)
Creatinine, Ser: 0.66 mg/dL (ref 0.44–1.00)
GFR calc Af Amer: >60 mL/min (ref >60)
GLUCOSE: 101 mg/dL — AB (ref 65–99)
Potassium: 3.1 mmol/L — ABNORMAL LOW (ref 3.5–5.1)
SODIUM: 139 mmol/L (ref 135–145)

## 2016-08-01 MED ORDER — LORAZEPAM 2 MG/ML IJ SOLN
0.5000 mg | INTRAMUSCULAR | Status: DC | PRN
  Administered 2016-08-01: 0.5 mg via INTRAVENOUS

## 2016-08-01 MED ORDER — PROCHLORPERAZINE EDISYLATE 5 MG/ML IJ SOLN: 10.0000 mg | Freq: Four times a day (QID) | INTRAMUSCULAR | Status: DC | PRN

## 2016-08-01 MED ORDER — POTASSIUM CHLORIDE CRYS ER 20 MEQ PO TBCR
40.0000 meq | EXTENDED_RELEASE_TABLET | ORAL | Status: AC
  Administered 2016-08-01 (×2): 40 meq via ORAL
  Filled 2016-08-01 (×2): qty 2

## 2016-08-01 MED ORDER — LORAZEPAM 0.5 MG PO TABS: 0.5000 mg | ORAL_TABLET | Freq: Four times a day (QID) | ORAL | Status: DC | PRN

## 2016-08-01 NOTE — Progress Notes (Signed)
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 Clinical improvement with IV fluids. Repeat orthostatics negative. No further syncopal episodes. 2-D echo negative for any valvular stenosis. EF of 60-65% with no wall motion abnormalities. Grade 2 diastolic dysfunction. PT/OT. Follow.  #2 glioblastoma multiform of the brain MRI with and without contrast with overall interval progression of disease as evidenced by worsening T2/FLAIR and diffusion signal abnormality involving the anterior cerebral hemispheres bilaterally, most likely reflecting infiltrative nonenhancing tumor. No other acute intracranial process identified. Continue current dose of Decadron. Continue Keppra for seizure prophylaxis. Patient has been seen by oncology who has reviewed MRI findings and would like to present to the brain tumor board to verify whether this is truly a progression and then decide on further treatment. Patient will likely need to reschedule outpatient appointment with oncology.  #3 orthostatic hypotension Improved with hydration.  #4 hypothyroidism TSH at 1.900. Continue home dose Synthroid.  #5 diabetes mellitus Hemoglobin A1c 6.8. CBGs have ranged from 111-129. Patient  with a poor appetite and history of glioblastoma multiforme of the brain. Patient's diet has been liberalized. Continue Lantus and sliding scale insulin.   #6 hypokalemia Replete.  #7 depression Stable. Continue Zoloft.  #8 gastroesophageal reflux disease  PPI.  #9 hypertension Blood pressure stable. Follow.  #10 seizure disorder Stable. No seizures noted. Continue Keppra.   DVT prophylaxis: SCDs Code Status: Full Family Communication: Updated patient. No family present. Disposition Plan: Skilled nursing facility when medically stable hopefully tomorrow.    Consultants:   Oncology: Dr Lindi Adie 07/31/2016  Procedures:   2-D echo 07/31/2016  Chest x-ray 07/29/2016  MRI head 07/29/2016  Antimicrobials:   None   Subjective: Patient complaining of nausea and emesis this morning. Does not feel too well. No further syncopal episodes. Patient with some repetitive questions. No chest pain. No shortness of breath.   Objective: Vitals:   07/31/16 0436 07/31/16 1056 07/31/16 1554 08/01/16 0702  BP: (!) 190/98 140/79 139/80 138/81  Pulse: (!) 49 (!) 53 (!) 56 (!) 48  Resp: 20 20 20 18   Temp: 98.9 F (37.2 C) 98.2 F (36.8 C) 97.9 F (36.6 C) 98.4 F (36.9 C)  TempSrc: Oral Oral Oral Oral  SpO2: 98% 98% 95% 98%  Weight:      Height:       No intake or output data in the 24 hours ending 08/01/16 1254 Filed Weights   07/29/16 1536  Weight: 80.7 kg (178 lb)    Examination:  General exam: Appears calm and comfortable  Respiratory system: Clear to auscultation. Respiratory effort normal. Cardiovascular system: S1 & S2 heard, RRR. No JVD, murmurs, rubs, gallops or clicks. No pedal edema. Gastrointestinal system: Abdomen is nondistended, soft and nontender. No organomegaly or masses felt. Normal bowel sounds heard. Central nervous system: Alert and oriented. No  focal neurological deficits. Extremities: Symmetric 5 x 5 power. Skin: No rashes, lesions or  ulcers Psychiatry: Judgement and insight appear normal. Mood & affect appropriate.     Data Reviewed: I have personally reviewed following labs and imaging studies  CBC:  Recent Labs Lab 07/29/16 1555 07/30/16 0347  WBC 12.2* 8.2  HGB 16.2* 14.0  HCT 47.1* 40.8  MCV 94.6 94.4  PLT [id number]   Basic Metabolic Panel:  Recent Labs Lab 07/29/16 1555 07/29/16 2130 07/30/16 0347 07/31/16 0600 08/01/16 0518  NA 140  --  141 137 139  K 2.9* 3.7 3.9 4.6 3.1*  CL 106  --  115* 109 110  CO2 23  --  21* 22 21*  GLUCOSE 137*  --  87 90 101*  BUN 23*  --  12 6 7   CREATININE 1.27*  --  0.78 0.72 0.66  CALCIUM 9.0  --  8.0* 8.5* 7.8*  MG 1.8  --   --   --   --    GFR: Estimated Creatinine Clearance: 76.1 mL/min (by C-G formula based on SCr of 0.66 mg/dL). Liver Function Tests: No results for input(s): AST, ALT, ALKPHOS, BILITOT, PROT, ALBUMIN in the last 168 hours. No results for input(s): LIPASE, AMYLASE in the last 168 hours. No results for input(s): AMMONIA in the last 168 hours. Coagulation Profile: No results for input(s): INR, PROTIME in the last 168 hours. Cardiac Enzymes:  Recent Labs Lab 07/29/16 2110 07/30/16 0347  TROPONINI <0.03 <0.03   BNP (last 3 results) No results for input(s): PROBNP in the last 8760 hours. HbA1C:  Recent Labs  07/29/16 1555  HGBA1C 6.8*   CBG:  Recent Labs Lab 07/31/16 1149 07/31/16 1720 07/31/16 2137 08/01/16 0738 08/01/16 1150  GLUCAP 123* 166* 149* 111* 112*   Lipid Profile: No results for input(s): CHOL, HDL, LDLCALC, TRIG, CHOLHDL, LDLDIRECT in the last 72 hours. Thyroid Function Tests:  Recent Labs  07/30/16 0347  TSH 1.900   Anemia Panel: No results for input(s): VITAMINB12, FOLATE, FERRITIN, TIBC, IRON, RETICCTPCT in the last 72 hours. Sepsis Labs: No results for input(s): PROCALCITON, LATICACIDVEN in the last 168 hours.  No results found for this or any previous visit (from the past 240 hour(s)).        Radiology Studies: No results found.      Scheduled Meds: . bacitracin   Topical BID  . dexamethasone  2 mg Oral Daily  . insulin aspart  0-9 Units Subcutaneous TID WC  . insulin glargine  5 Units Subcutaneous QHS  . levETIRAcetam  1,500 mg Oral BID  . levothyroxine  12.5 mcg Oral QAC breakfast  . pantoprazole  40 mg Oral BID  . potassium chloride  40 mEq Oral Q4H  . sertraline  150 mg Oral Daily  . sodium chloride flush  10-40 mL Intracatheter Q12H  . sodium chloride flush  3 mL Intravenous Q12H   Continuous Infusions:   LOS: 2 days    Time spent: 18 mins    Karlea Mckibbin, MD [facility] Pager [phone]  If 7PM-7AM, please contact night-coverage [email or web address] Password [email or web address] 08/01/2016, 12:54 PM

## 2016-08-01 NOTE — Telephone Encounter (Signed)
"  This is Pritika's mother.  Meliah needs Dr. Lindi Adie to come over and give the MRI results.  She's not transferring anywhere until she knows what's going on.  I'm on my way to the hospital and will be there by 12:30."   Will notify provider and advised he is seeing scheduled outpatients which may mean visit after hours.  " I don't ta;lk while driving but can be called and reached at [phone]."

## 2016-08-01 NOTE — Progress Notes (Signed)
Physical Therapy Treatment Patient Details Name: Michelle Duran MRN: [id number] DOB: 1956/06/09 Today's Date: 08/01/2016    History of Present Illness 60 yo female admittd with syncope and collapse. Hx of R craniotomy 08/2015.     PT Comments    Patient was seen in bed upon arrival. Performed supine to sit x  minA with repeat functional VC's to complete task . VC's for hand placement and use of rails to raise trunk. Increased time required.  Sit to stand x modA with  VC's for UE/LE placement and for controlled decent. Ambulated to bathroom and performed stand pivot transfer in bathroom. Posterior lean with modA/maxA to correct. LOB x 5 during side stepping, maneuvering turns, and taking steps backwards. Gait x 30 ft x modA. Patient displays  very unsteady "drunken" gait with poor self correction to midline and delayed corrective reaction to balance. Quick with gait speed. Max verbal/tactile cues for LE positioning within the walker. LOB x 3 during gait with L sided lean. External assist to correct. Patient is a high fall risk and is easily distracted/impulsive. Patient plans to D/C to SNF.   Follow Up Recommendations  SNF     Equipment Recommendations       Recommendations for Other Services OT consult     Precautions / Restrictions Precautions Precautions: Fall Precaution Comments: S/P Craniotomy 08/2015 Restrictions Weight Bearing Restrictions: No    Mobility  Bed Mobility Overal bed mobility: Needs Assistance Bed Mobility: Supine to Sit     Supine to sit: Min assist     General bed mobility comments: repeat functional VC's to complete task (easily distracted/hyper talkative), VC's for hand placement and use of rails to raise trunk. Increased time required.   Transfers Overall transfer level: Needs assistance Equipment used: Rolling walker (2 wheeled);None Transfers: Sit to/from Stand Sit to Stand: Mod assist Stand pivot transfers: Mod assist       General transfer  comment: VC's for UE/LE placement and for controlled decent. Stand pivot transfer in bathroom. Posterior lean with modA/maxA to correct. LOB x 5 during side stepping, maneuvering turns, and taking steps backwards. High risk of falls.   Ambulation/Gait Ambulation/Gait assistance: Mod assist Ambulation Distance (Feet): 30 Feet Assistive device: Rolling walker (2 wheeled) Gait Pattern/deviations: Step-to pattern;Step-through pattern;Drifts right/left;Trunk flexed;Decreased step length - right Gait velocity: too quick, VC's to decrease gait speed to increase safety Gait velocity interpretation: Below normal speed for age/gender General Gait Details: very unsteady "drunken" gait with poor self correction to midline and delayed corrective reaction to balance. Quick with gait speed. Max verbal/tactile cues for LE positioning within the walker. LOB x 3 during gait with L sided lean. External assist to correct.  High fall risk.    Stairs            Wheelchair Mobility    Modified Rankin (Stroke Patients Only)       Balance                                    Cognition Arousal/Alertness: Awake/alert   Overall Cognitive Status: Within Functional Limits for tasks assessed Area of Impairment: Safety/judgement         Safety/Judgement: Decreased awareness of safety     General Comments: impulsive    Exercises      General Comments        Pertinent Vitals/Pain Pain Assessment: Faces Faces Pain Scale: Hurts a little bit  Pain Descriptors / Indicators: Discomfort Pain Intervention(s): Monitored during session;Repositioned    Home Living                      Prior Function            PT Goals (current goals can now be found in the care plan section) Progress towards PT goals: Progressing toward goals    Frequency    Min 3X/week      PT Plan Current plan remains appropriate    Co-evaluation             End of Session Equipment  Utilized During Treatment: Gait belt Activity Tolerance: Patient tolerated treatment well Patient left: in chair;with call bell/phone within reach;with family/visitor present     Time: [id number] PT Time Calculation (min) (ACUTE ONLY): 27 min  Charges:  $Gait Training: 8-22 mins $Therapeutic Activity: 8-22 mins                    G CodesHall Busing, SPTA [facility] Acute Rehab [phone]  Present and agree with above  Rica Koyanagi  PTA [facility]  Acute  Rehab Pager      [phone]

## 2016-08-01 NOTE — Clinical Social Work Note (Signed)
Clinical Social Work Assessment  Patient Details  Name: Michelle Duran MRN: [id number] Date of Birth: 28-Apr-1956  Date of referral:  08/01/16               Reason for consult:  Facility Placement                Permission sought to share information with:  Family Supports, Customer service manager Permission granted to share information::  Yes, Verbal Permission Granted  Name::     Emilia Beck, Lorelle [place], [place]::  SNF  Relationship::  Mother, Father and Son   Contact Information:  [phone], [phone]  Housing/Transportation Living arrangements for the past 2 months:  [place] of Information:  Patient Patient Interpreter Needed:  None Criminal Activity/Legal Involvement Pertinent to Current Situation/Hospitalization:  No - Comment as needed Significant Relationships:  Adult Children Lives with:  Self Do you feel safe going back to the place where you live?  Yes Need for family participation in patient care:  No (Coment)  Care giving concerns:  Patient seen by PT, recommended to go skill. Patient reports difficulty with ambulating and not being able to manage at home with only the help of her elderly parents and son.     Social Worker assessment / plan:  LCSWA met with patient at bedside and explained reason for consult. Patient agreeable to go to SNF.  [place] educated patient about the SNF process, and insurance authorization for [employer]. Patient expressesd she does not want to go [employer] or [facility] as she did not have a good experience when her mother was a resident.  Patient will need transportation to appointments.   11/30 LCSWA provided patient with SNF list.   Employment status:    Insurance information:  Other (Comment Required) Nurse, mental health) PT Recommendations:  [place] / Referral to community resources:     Patient/Family's Response to care:   Agreeable  Patient/Family's Understanding of and Emotional Response to Diagnosis, Current Treatment, and Prognosis:  Patient is well educated about her diagnosis. Patient has idea about Skill care and is agreeable to go to SNF. Patient has option for family to assist with transportation to appointments while at SNF.   Emotional Assessment Appearance:  Appears stated age Attitude/Demeanor/Rapport:    Affect (typically observed):  Calm, Pleasant, Accepting Orientation:  Oriented to Self, Oriented to Place, Oriented to  Time, Oriented to Situation Alcohol / Substance use:  Not Applicable Psych involvement (Current and /or in the community):  No   Discharge Needs  Concerns to be addressed:  Care Coordination, Discharge Planning Concerns Readmission within the last 30 days:  No Current discharge risk:  Dependent with Mobility Barriers to Discharge:  Continued Medical Work up, [place], LCSW 08/01/2016, 11:58 AM

## 2016-08-02 LAB — BASIC METABOLIC PANEL
Anion gap: 7 (ref 5–15)
BUN: 11 mg/dL (ref 6–20)
CALCIUM: 8.6 mg/dL — AB (ref 8.9–10.3)
CHLORIDE: 106 mmol/L (ref 101–111)
CO2: 25 mmol/L (ref 22–32)
CREATININE: 0.84 mg/dL (ref 0.44–1.00)
GFR calc Af Amer: >60 mL/min (ref >60)
GFR calc non Af Amer: >60 mL/min (ref >60)
Glucose, Bld: 106 mg/dL — ABNORMAL HIGH (ref 65–99)
Potassium: 4.1 mmol/L (ref 3.5–5.1)
SODIUM: 138 mmol/L (ref 135–145)

## 2016-08-02 LAB — GLUCOSE, CAPILLARY
GLUCOSE-CAPILLARY: 104 mg/dL — AB (ref 65–99)
Glucose-Capillary: 119 mg/dL — ABNORMAL HIGH (ref 65–99)
Glucose-Capillary: 148 mg/dL — ABNORMAL HIGH (ref 65–99)
Glucose-Capillary: 122 mg/dL — ABNORMAL HIGH (ref 65–99)

## 2016-08-02 MED ORDER — ENSURE ENLIVE PO LIQD
237.0000 mL | Freq: Two times a day (BID) | ORAL | Status: DC
  Administered 2016-08-03 (×2): 237 mL via ORAL

## 2016-08-02 NOTE — Consult Note (Signed)
Consultation Note Date: 08/02/2016   Patient Name: Michelle Duran  DOB: 1956-06-04  MRN: [id number]  Age / Sex: 60 y.o., female  PCP: Carol Ada, MD Referring Physician: Eugenie Filler, MD  Reason for Consultation: Establishing goals of care  HPI/Patient Profile: 60 y.o. female  admitted on 07/29/2016   Life limiting illness: glioblastoma multiforme  Clinical Assessment and Goals of Care:  60 yo lady with advanced glioblastoma multiforme, admitted with syncope and generalized weakness. Also found to have orthostasis. Patient was on chemotherapy Avastin since Kristi 2017, Dr Lindi Adie is her oncologist. Brain imaging here shows possible progression of her disease, she was to be sent to SNF rehab today, how ever, discharge was cancelled, palliative input has been recommended.   Patient is resting by the side of the bed, her mother and her son's girlfriend are present in the room, patient has to go to the bathroom to have a bowel movement. I introduced myself and palliative care as follows: Palliative medicine is specialized medical care for people living with serious illness. It focuses on providing relief from the symptoms and stress of a serious illness. The goal is to improve quality of life for both the patient and the family.  Patient states she is not able to eat much. At times, she has a head ache and also feels nauseous. Patient has met with Dr Lindi Adie earlier today. She states that she is awaiting further discussions after tumor board on Monday with regards to her treatment options.   We discussed about cancer related anorexia and how to manage it. Symptom management options discussed, see recommendations below, thank you for the consult, we will follow along.   NEXT OF KIN  lives with parents, mom and dad are primary caregivers. Has a son.   SUMMARY OF RECOMMENDATIONS    full code for now.  Patient  and family would like to wait until Monday, after tumor board, to discuss further with Dr Lindi Adie.  Ensure PO BID, encouraged patient to consider multiple small meals.  Continue current scope of care.   Code Status/Advance Care Planning:  Full code    Symptom Management:     Continue current mode of care.   Palliative Prophylaxis:   Bowel Regimen  Additional Recommendations (Limitations, Scope, Preferences):  Full Scope Treatment  Psycho-social/Spiritual:   Desire for further Chaplaincy support:no  Additional Recommendations: Caregiving  Support/Resources  Prognosis:   Unable to determine  Discharge Planning: To Be Determined      Primary Diagnoses: Present on Admission: . Syncope and collapse . Glioblastoma multiforme of brain ([place]) . GERD . Depression . Diabetes mellitus, iatrogenic ([place]) . Essential hypertension . Hypokalemia . Hypothyroidism   I have reviewed the medical record, interviewed the patient and family, and examined the patient. The following aspects are pertinent.  Past Medical History:  Diagnosis Date  . Anxiety   . Arthritis   . Cancer ([place])    BRAIN  . Complication of anesthesia    Pt was shaking after having epidural  during c-section  . Diabetes mellitus without complication ([place])   . Family history of adverse reaction to anesthesia    mother had PONV  . Fibromyalgia   . Gallstone   . GERD (gastroesophageal reflux disease)   . History of hiatal hernia   . Hypertension   . Hypothyroidism   . Mass of adrenal gland ([place])   . Mitral valve prolapse   . Neoplasm of brain ([place])   . Nocturia   . Numbness and tingling    fingers and toes  . Panic attacks   . PVC's (premature ventricular contractions)    saw cardiologist Dr. Claiborne Billings > 3 years ago with echo and stress (entered 08/08/2015)  . Seizures ([place])   . Tobacco abuse   . Vertigo   . Vitamin D deficiency   . Wears glasses    Social History   Social History  . Marital  status: Divorced    Spouse name: N/A  . Number of children: 1  . Years of education: N/A   Occupational History  . cafateria Freight forwarder    Social History Main Topics  . Smoking status: Former Smoker    Packs/day: 1.00    Years: 41.00    Types: Cigarettes    Quit date: 07/24/2015  . Smokeless tobacco: Never Used  . Alcohol use No  . Drug use: No  . Sexual activity: Not Currently   Other Topics Concern  . None   Social History Narrative  . None   Family History  Problem Relation Age of Onset  . Hypertension Father   . Glaucoma    . Ovarian cancer Cousin   . Breast cancer Paternal Grandmother    Scheduled Meds: . bacitracin   Topical BID  . dexamethasone  2 mg Oral Daily  . feeding supplement (ENSURE ENLIVE)  237 mL Oral BID BM  . insulin aspart  0-9 Units Subcutaneous TID WC  . insulin glargine  5 Units Subcutaneous QHS  . levETIRAcetam  1,500 mg Oral BID  . levothyroxine  12.5 mcg Oral QAC breakfast  . pantoprazole  40 mg Oral BID  . sertraline  150 mg Oral Daily  . sodium chloride flush  10-40 mL Intracatheter Q12H  . sodium chloride flush  3 mL Intravenous Q12H   Continuous Infusions: PRN Meds:.acetaminophen **OR** acetaminophen, alum & mag hydroxide-simeth, benzonatate, LORazepam, LORazepam, ondansetron **OR** ondansetron (ZOFRAN) IV, prochlorperazine, sodium chloride flush Medications Prior to Admission:  Prior to Admission medications   Medication Sig Start Date End Date Taking? Authorizing Provider  acetaminophen (TYLENOL) 650 MG CR tablet Take 1,300 mg by mouth every 8 (eight) hours as needed for pain. Reported on 11/09/2015   Yes Historical Provider, MD  amoxicillin (AMOXIL) 875 MG tablet Take 875 mg by mouth 2 (two) times daily. Started 11/22... 07/24/16  Yes Historical Provider, MD  benzonatate (TESSALON) 100 MG capsule Take 100 mg by mouth 3 (three) times daily as needed for cough. 07/24/16  Yes Historical Provider, MD  clonazePAM (KLONOPIN) 0.5 MG  disintegrating tablet DISSOLVE 1 TABLET IN MOUTH EVERY 12 HOURS AS NEEDED SEIZURE 11/01/15  Yes Historical Provider, MD  dexamethasone (DECADRON) 4 MG tablet Take 1 tablet (4 mg total) by mouth daily. Patient taking differently: Take 2 mg by mouth daily.  03/29/16  Yes Nicholas Lose, MD  Insulin Glargine (LANTUS) 100 UNIT/ML Solostar Pen Inject 8 Units into the skin daily at 10 pm. 11/28/15  Yes Nicholas Lose, MD  levETIRAcetam (KEPPRA) 500 MG tablet Take 3  tablets (1,500 mg total) by mouth 2 (two) times daily. 03/29/16  Yes Hayden Pedro, PA-C  LORazepam (ATIVAN) 0.5 MG tablet TAKE 1 TABLET BY MOUTH EVERY 4 TO 6 HOURS AS NEEDED FOR NAUSEA 06/17/16  Yes Nicholas Lose, MD  pantoprazole (PROTONIX) 40 MG tablet Take 1 tablet (40 mg total) by mouth 2 (two) times daily. 04/09/16  Yes Nicholas Lose, MD  sertraline (ZOLOFT) 100 MG tablet Take 150 mg by mouth at bedtime. Reported on 11/09/2015   Yes Historical Provider, MD  SYNTHROID 25 MCG tablet Take 12.5 mcg by mouth daily before breakfast.  10/13/15  Yes Historical Provider, MD  [place] test strip See admin instructions. 01/12/16   Historical Provider, MD  B-D ULTRAFINE III SHORT PEN 31G X 8 MM MISC  11/03/15   Historical Provider, MD   Allergies  Allergen Reactions  . Codeine     REACTION: vomiting  . Prednisone     Panic attacks  . Sulfonamide Derivatives     REACTION: nausea   Review of Systems + for lack of appetite, + for mild nausea and headache.   Physical Exam Young lady sitting by edge of the bed S1 S2 Clear Abdomen soft No edema Generalized weakness, no dizziness today  Vital Signs: BP 127/78 (BP Location: Right Arm)   Pulse 60   Temp 97.9 F (36.6 C) (Axillary)   Resp 18   Ht _0  (1.6 m)   Wt 80.7 kg (178 lb)   SpO2 96%   BMI 31.53 kg/m  Pain Assessment: 0-10   Pain Score: 2    SpO2: SpO2: 96 % O2 Device:SpO2: 96 % O2 Flow Rate: .   IO: Intake/output summary:   Intake/Output Summary (Last 24 hours) at  08/02/16 1531 Last data filed at 08/02/16 0800  Gross per 24 hour  Intake              250 ml  Output                0 ml  Net              250 ml    LBM: Last BM Date: 07/31/16 Baseline Weight: Weight: 80.7 kg (178 lb) Most recent weight: Weight: 80.7 kg (178 lb)     Palliative Assessment/Data:   Flowsheet Rows   Flowsheet Row Most Recent Value  Intake Tab  Referral Department  Hospitalist  Unit at Time of Referral  Med/Surg Unit  Palliative Care Primary Diagnosis  Cancer  Date Notified  08/02/16  Palliative Care Type  New Palliative care  Reason for referral  Non-pain Symptom, Clarify Goals of Care  Date of Admission  07/29/16  Date first seen by Palliative Care  08/02/16  # of days IP prior to Palliative referral  4  Clinical Assessment  Palliative Performance Scale Score  40%  Pain Max last 24 hours  5  Pain Min Last 24 hours  4  Dyspnea Max Last 24 Hours  5  Dyspnea Min Last 24 hours  4  Psychosocial & Spiritual Assessment  Palliative Care Outcomes  Patient/Family meeting held?  Yes  Who was at the meeting?  patient mother   Palliative Care Outcomes  Improved non-pain symptom therapy      Time In:  1400 Time Out:  1500 Time Total:  60 min  Greater than 50%  of this time was spent counseling and coordinating care related to the above assessment and plan.  Signed by:  Loistine Chance, MD  [phone]  Please contact Palliative Medicine Team phone at [phone] for questions and concerns.  For individual provider: See Shea Evans

## 2016-08-02 NOTE — Progress Notes (Signed)
Palliative Medicine consult noted. Due to high referral volume, there may be a delay seeing this patient. Please call the Palliative Medicine Team office at [phone] if recommendations are needed in the interim.  Based on order notes and progress notes, it may be appropriate to place a care management order for hospice if the patient is going home or a SW order for residential hospice, depending on the prognosis. It will likely be 24-48 hours before a PMT provider can see the patient and meet with the family.  Thank you for inviting [place] to see this patient.  Marjie Skiff Jamee Keach, RN, BSN, [facility] 08/02/2016 12:18 PM Cell [phone] 8:00-4:00 Monday-Friday Office [phone]

## 2016-08-02 NOTE — Progress Notes (Signed)
Physical Therapy Treatment Patient Details Name: Michelle Duran MRN: [id number] DOB: [birth year] Today's Date: 08/02/2016    History of Present Illness 60 yo female admittd with syncope and collapse. Hx of R craniotomy 08/2015.     PT Comments    Assisted OOB to amb to bathroom required + 2 assist for safety.  Very unsteady gait with noted L side weakness.  Assisted with amb + 2 hand held assist as pt had increased difficulty using/advancing walker correctly.    Follow Up Recommendations  SNF     Equipment Recommendations       Recommendations for Other Services       Precautions / Restrictions Precautions Precautions: Fall Precaution Comments: S/P Craniotomy 08/2015 Restrictions Weight Bearing Restrictions: No    Mobility  Bed Mobility Overal bed mobility: Needs Assistance Bed Mobility: Supine to Sit;Sit to Supine     Supine to sit: Min guard Sit to supine: Min guard;Min assist   General bed mobility comments: increased time and repeat VC's to stay on task   Transfers Overall transfer level: Needs assistance Equipment used: Rolling walker (2 wheeled);None Transfers: Sit to/from [facility] Sit to Stand: Mod assist Stand pivot transfers: Mod assist       General transfer comment: VC's for UE/LE placement and for controlled decent. Stand pivot transfer in bathroom. Posterior lean with modA/maxA to correct. LOB x 5 during side stepping, maneuvering turns, and taking steps backwards. High risk of falls.   Ambulation/Gait Ambulation/Gait assistance: Mod assist;+2 physical assistance;+2 safety/equipment Ambulation Distance (Feet): 32 Feet Assistive device: Rolling walker (2 wheeled);2 person hand held assist Gait Pattern/deviations: Step-to pattern;Step-through pattern;Trunk flexed;Drifts right/left;Staggering left     General Gait Details:  Very unsteady gait with Mod L lean and decreased L hand grip on walker with poor self control to proper  advance AD.  Assisted with amb + 2 hand held assist.     Stairs            Wheelchair Mobility    Modified Rankin (Stroke Patients Only)       Balance                                    Cognition Arousal/Alertness: Awake/alert Behavior During Therapy: WFL for tasks assessed/performed Overall Cognitive Status: Within Functional Limits for tasks assessed                      Exercises      General Comments        Pertinent Vitals/Pain Pain Assessment: Faces Faces Pain Scale: Hurts a little bit Pain Location: back "stiff" Pain Descriptors / Indicators: Constant Pain Intervention(s): Monitored during session;Repositioned    Home Living                      Prior Function            PT Goals (current goals can now be found in the care plan section) Progress towards PT goals: Progressing toward goals    Frequency    Min 3X/week      PT Plan Current plan remains appropriate    Co-evaluation             End of Session Equipment Utilized During Treatment: Gait belt   Patient left: in bed     Time: [id number] PT Time Calculation (min) (ACUTE ONLY): 13 min  Charges:  $  Gait Training: 8-22 mins                    G Codes:      Rica Koyanagi  PTA [facility]  Acute  Rehab Pager      [phone]

## 2016-08-02 NOTE — Progress Notes (Signed)
Patient has decided to go [facility], insurance authorization was started 11/30. Will continue to follow and assist with patient disposition.   Kathrin Greathouse, Latanya Presser, MSW Clinical Social Worker [place] [facility] [phone]

## 2016-08-02 NOTE — Progress Notes (Signed)
PROGRESS NOTE    Michelle Duran  [id number] DOB: 02-Oct-1955 DOA: 07/29/2016 PCP: Reginia Naas, MD   Brief Narrative:   Michelle Duran a 60 y.o.femalewith medical history significant of glioblastoma multiforme brain on chemotherapy, last dose was on Thursday, anxiety, seizure disorder, hypothyroidism, presented with generalized weakness, fall and loss of consciousness episode at home   Assessment & Plan:   Principal Problem:   Syncope and collapse Active Problems:   Glioblastoma multiforme of brain ([facility])   Orthostatic hypotension   Hypothyroidism   Essential hypertension   GERD   Depression   Diabetes mellitus, iatrogenic ([facility])   Hypokalemia   Dehydration  #1 syncope and collapse Likely secondary to glioblastoma multiforme and orthostatic hypotension. Clinical improvement with IV fluids. Repeat orthostatics negative. No further syncopal episodes. 2-D echo negative for any valvular stenosis. EF of 60-65% with no wall motion abnormalities. Grade 2 diastolic dysfunction. PT/OT. Follow.  #2 glioblastoma multiform of the brain MRI with and without contrast with overall interval progression of disease as evidenced by worsening T2/FLAIR and diffusion signal abnormality involving the anterior cerebral hemispheres bilaterally, most likely reflecting infiltrative nonenhancing tumor. No other acute intracranial process identified. Continue current dose of Decadron. Continue Keppra for seizure prophylaxis. Patient has been seen by oncology who has reviewed MRI findings and would like to present to the brain tumor board to verify whether this is truly a progression and then decide on further treatment vs hospice. Patient will likely need to reschedule outpatient appointment with oncology.  #3 orthostatic hypotension Improved with hydration.  #4 hypothyroidism TSH at 1.900. Continue home dose Synthroid.  #5 diabetes mellitus Hemoglobin A1c 6.8. CBGs have ranged from 104-148.  Patient with a poor appetite and history of glioblastoma multiforme of the brain. Patient's diet has been liberalized. Continue Lantus and sliding scale insulin.   #6 hypokalemia Replete.  #7 depression Stable. Continue Zoloft.  #8 gastroesophageal reflux disease  PPI.  #9 hypertension Blood pressure stable. Follow.  #10 seizure disorder Stable. No seizures noted. Continue Keppra.   DVT prophylaxis: SCDs Code Status: Full Family Communication: Updated patient. No family present. Disposition Plan: Skilled nursing facility when medically stable.    Consultants:   Oncology: Dr Lindi Adie 07/31/2016  Palliative care  Procedures:   2-D echo 07/31/2016  Chest x-ray 07/29/2016  MRI head 07/29/2016  Antimicrobials:   None   Subjective: Patient states improvement with nausea and emesis. Patient tolerated diet. No syncopal episodes. No chest pain. No shortness of breath.   Objective: Vitals:   08/01/16 1434 08/01/16 1659 08/01/16 1900 08/02/16 0637  BP:  116/73 (!) 136/95 134/77  Pulse: 68 (!) 54 (!) 56 (!) 53  Resp:  18 18 18   Temp:  98.7 F (37.1 C) 98.3 F (36.8 C) 98.3 F (36.8 C)  TempSrc:  Oral Oral Oral  SpO2:  95% 96% 97%  Weight:      Height:        Intake/Output Summary (Last 24 hours) at 08/02/16 1216 Last data filed at 08/02/16 0519  Gross per 24 hour  Intake              130 ml  Output                0 ml  Net              130 ml   Filed Weights   07/29/16 1536  Weight: 80.7 kg (178 lb)    Examination:  General exam: Appears  calm and comfortable  Respiratory system: Clear to auscultation. Respiratory effort normal. Cardiovascular system: S1 & S2 heard, RRR. No JVD, murmurs, rubs, gallops or clicks. No pedal edema. Gastrointestinal system: Abdomen is nondistended, soft and nontender. No organomegaly or masses felt. Normal bowel sounds heard. Central nervous system: Alert and oriented. No focal neurological deficits. Extremities: Symmetric  5 x 5 power. Skin: No rashes, lesions or ulcers Psychiatry: Judgement and insight appear normal. Mood & affect appropriate.     Data Reviewed: I have personally reviewed following labs and imaging studies  CBC:  Recent Labs Lab 07/29/16 1555 07/30/16 0347  WBC 12.2* 8.2  HGB 16.2* 14.0  HCT 47.1* 40.8  MCV 94.6 94.4  PLT [id number]   Basic Metabolic Panel:  Recent Labs Lab 07/29/16 1555 07/29/16 2130 07/30/16 0347 07/31/16 0600 08/01/16 0518 08/02/16 0517  NA 140  --  141 137 139 138  K 2.9* 3.7 3.9 4.6 3.1* 4.1  CL 106  --  115* 109 110 106  CO2 23  --  21* 22 21* 25  GLUCOSE 137*  --  87 90 101* 106*  BUN 23*  --  12 6 7 11   CREATININE 1.27*  --  0.78 0.72 0.66 0.84  CALCIUM 9.0  --  8.0* 8.5* 7.8* 8.6*  MG 1.8  --   --   --   --   --    GFR: Estimated Creatinine Clearance: 72.5 mL/min (by C-G formula based on SCr of 0.84 mg/dL). Liver Function Tests: No results for input(s): AST, ALT, ALKPHOS, BILITOT, PROT, ALBUMIN in the last 168 hours. No results for input(s): LIPASE, AMYLASE in the last 168 hours. No results for input(s): AMMONIA in the last 168 hours. Coagulation Profile: No results for input(s): INR, PROTIME in the last 168 hours. Cardiac Enzymes:  Recent Labs Lab 07/29/16 2110 07/30/16 0347  TROPONINI <0.03 <0.03   BNP (last 3 results) No results for input(s): PROBNP in the last 8760 hours. HbA1C: No results for input(s): HGBA1C in the last 72 hours. CBG:  Recent Labs Lab 08/01/16 1150 08/01/16 1736 08/01/16 2107 08/02/16 0741 08/02/16 1145  GLUCAP 112* 129* 135* 104* 119*   Lipid Profile: No results for input(s): CHOL, HDL, LDLCALC, TRIG, CHOLHDL, LDLDIRECT in the last 72 hours. Thyroid Function Tests: No results for input(s): TSH, T4TOTAL, FREET4, T3FREE, THYROIDAB in the last 72 hours. Anemia Panel: No results for input(s): VITAMINB12, FOLATE, FERRITIN, TIBC, IRON, RETICCTPCT in the last 72 hours. Sepsis Labs: No results for  input(s): PROCALCITON, LATICACIDVEN in the last 168 hours.  No results found for this or any previous visit (from the past 240 hour(s)).       Radiology Studies: No results found.      Scheduled Meds: . bacitracin   Topical BID  . dexamethasone  2 mg Oral Daily  . insulin aspart  0-9 Units Subcutaneous TID WC  . insulin glargine  5 Units Subcutaneous QHS  . levETIRAcetam  1,500 mg Oral BID  . levothyroxine  12.5 mcg Oral QAC breakfast  . pantoprazole  40 mg Oral BID  . sertraline  150 mg Oral Daily  . sodium chloride flush  10-40 mL Intracatheter Q12H  . sodium chloride flush  3 mL Intravenous Q12H   Continuous Infusions:   LOS: 3 days    Time spent: 33 mins    Edeline Greening, MD [facility] Pager [phone]  If 7PM-7AM, please contact night-coverage [email or web address] Password [email or web address] 08/02/2016, 12:16 PM

## 2016-08-02 NOTE — Progress Notes (Signed)
I met with the patient's mother and father in my clinic I reviewed the brain MRI with the patient's family. We will be presenting her case in the tumor board next Monday. If this is truly progression of disease, then her treatment options are extremely limited. We discussed about different options including hospice and palliative care. Her mother and father were interested to find out more about hospice options for her.  I placed a consult for hospice to discuss with her. I will speak to her as well about this referral. Patient's family were concerned that she is not a great candidate for rehabilitation because she has incurable cancer and has too many comorbidities as well as inability to eat and be able to be up and stay active.

## 2016-08-03 LAB — BASIC METABOLIC PANEL
ANION GAP: 9 (ref 5–15)
BUN: 12 mg/dL (ref 6–20)
CHLORIDE: 104 mmol/L (ref 101–111)
CO2: 24 mmol/L (ref 22–32)
CREATININE: 0.82 mg/dL (ref 0.44–1.00)
Calcium: 8.7 mg/dL — ABNORMAL LOW (ref 8.9–10.3)
GFR calc non Af Amer: >60 mL/min (ref >60)
Glucose, Bld: 103 mg/dL — ABNORMAL HIGH (ref 65–99)
Potassium: 3.5 mmol/L (ref 3.5–5.1)
Sodium: 137 mmol/L (ref 135–145)

## 2016-08-03 LAB — GLUCOSE, CAPILLARY
GLUCOSE-CAPILLARY: 111 mg/dL — AB (ref 65–99)
GLUCOSE-CAPILLARY: 131 mg/dL — AB (ref 65–99)
Glucose-Capillary: 97 mg/dL (ref 65–99)

## 2016-08-03 MED ORDER — INSULIN GLARGINE 100 UNIT/ML SOLOSTAR PEN: 5.0000 [IU] | PEN_INJECTOR | Freq: Every day | SUBCUTANEOUS | 0 refills | Status: AC

## 2016-08-03 MED ORDER — POTASSIUM CHLORIDE CRYS ER 20 MEQ PO TBCR
40.0000 meq | EXTENDED_RELEASE_TABLET | Freq: Once | ORAL | Status: AC
  Administered 2016-08-03: 40 meq via ORAL
  Filled 2016-08-03: qty 2

## 2016-08-03 MED ORDER — ENSURE ENLIVE PO LIQD: 237.0000 mL | Freq: Two times a day (BID) | ORAL | 12 refills | Status: DC

## 2016-08-03 MED ORDER — ONDANSETRON HCL 4 MG PO TABS: 4.0000 mg | ORAL_TABLET | Freq: Four times a day (QID) | ORAL | 0 refills | Status: AC | PRN

## 2016-08-03 MED ORDER — DEXAMETHASONE 4 MG PO TABS: 2.0000 mg | ORAL_TABLET | Freq: Every day | ORAL | 0 refills | Status: AC

## 2016-08-03 MED ORDER — CLONAZEPAM 0.5 MG PO TBDP: ORAL_TABLET | ORAL | 0 refills | Status: DC

## 2016-08-03 MED ORDER — HEPARIN SOD (PORK) LOCK FLUSH 100 UNIT/ML IV SOLN
500.0000 [IU] | INTRAVENOUS | Status: AC | PRN
  Administered 2016-08-03: 500 [IU]

## 2016-08-03 MED ORDER — BACITRACIN ZINC 500 UNIT/GM EX OINT: TOPICAL_OINTMENT | Freq: Two times a day (BID) | CUTANEOUS | 0 refills | Status: AC

## 2016-08-03 MED ORDER — LORAZEPAM 0.5 MG PO TABS: ORAL_TABLET | ORAL | 0 refills | Status: DC

## 2016-08-03 NOTE — Discharge Summary (Signed)
Physician Discharge Summary  Michelle Duran [id number] DOB: 01/06/1956 DOA: 07/29/2016  PCP: Michelle Naas, MD  Admit date: 07/29/2016 Discharge date: 08/03/2016  Time spent: 60 minutes  Recommendations for Outpatient Follow-up:  1. Patient be discharged to a skilled nursing facility at [facility]. Follow-up with M.D. at [facility]. 2. Follow-up with Dr. Lindi Duran next week.   Discharge Diagnoses:  Principal Problem:   Syncope and collapse Active Problems:   Glioblastoma multiforme of brain (HCC)   Orthostatic hypotension   Hypothyroidism   Essential hypertension   Gastroesophageal reflux disease   Depression   Diabetes mellitus, iatrogenic ([place])   Hypokalemia   Dehydration   Discharge Condition: Stable  Diet recommendation: Regular  Filed Weights   07/29/16 1536  Weight: 80.7 kg (178 lb)    History of present illness:  Per Dr Carolin Sicks Michelle Duran is a 60 y.o. 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 Patient reported that she was in the bathroom when she lost her consciousness and had a fall. Patient denied hitting her head. As per EMS note, the patient was found to have blood pressure of 80/40 and heart rate 50s with positive orthostatic hypotension. Patient denied seizure-like activity. Denied jerky movement, tongue biting, incontinence of bowel or urine. Denied fever, chills.  Patient reported decreased oral intake for about a week.  In ER, patient denied headache, dizziness, blindness, nausea, vomiting, pain, shortness of breath, abdominal pain, diarrhea or constipation. Denied dysuria, urgency or frequency.  ED Course: Patient was found to have orthostatic hypotension. Treated with IV fluid bolus. Plan to get MRI of the brain for further  evaluation.   Hospital Course:  #1 syncope and collapse Likely secondary to glioblastoma multiforme and orthostatic hypotension. Patient was admitted and placed on IV fluids with improvement in orthostasis.  Repeat orthostatics negative. No further syncopal episodes. 2-D echo negative for any valvular stenosis. EF of 60-65% with no wall motion abnormalities. Grade 2 diastolic dysfunction. PT/OT. Patient improved clinically did not have any further syncopal episodes and will be discharged to a skilled nursing facility in stable condition.   #2 glioblastoma multiforme of the brain MRI with and without contrast with overall interval progression of disease as evidenced by worsening T2/FLAIR and diffusion signal abnormality involving the anterior cerebral hemispheres bilaterally, most likely reflecting infiltrative nonenhancing tumor. No other acute intracranial process identified. Continued on home regimen of Decadron. Continued on Keppra for seizure prophylaxis. Patient has been seen by oncology who has reviewed MRI findings and would like to present to the brain tumor board to verify whether this is truly a progression and then decide on further treatment vs hospice. Patient will likely need to reschedule outpatient appointment with oncology. Patient was also seen by palliative care and family wanted to wait until MRI findings are presented to the brain tumor board prior to making a decision on treatment. Patient remained stable and will be discharged to a skilled nursing facility.  #3 orthostatic hypotension Improved with hydration.  #4 hypothyroidism TSH at 1.900. Continued on home dose Synthroid.  #5 diabetes mellitus Hemoglobin A1c 6.8. CBGs remained stable during the hospitalization.  Patient with a poor appetite and history of glioblastoma multiforme of the brain. Patient's diet has been liberalized. Patient's home dose Lantus was decreased to 5 units daily. Patient was maintained on a  sliding scale insulin.   #  6 hypokalemia Repleted.  #7 depression Stable. Continued on home regimen of Zoloft.  #8 gastroesophageal reflux disease  Patient maintained on PPI.  #9 hypertension Blood pressure stable. Follow.  #10 seizure disorder Stable. No seizures noted. Continued on Keppra.   Procedures:  2-D echo 07/31/2016  Chest x-ray 07/29/2016  MRI head 07/29/2016   Consultations:  Oncology: Dr Michelle Duran 07/31/2016  Palliative care: Dr Rowe Pavy 08/02/2016  Discharge Exam: Vitals:   08/03/16 0425 08/03/16 1300  BP: (!) 144/84 114/67  Pulse: (!) 52 61  Resp: 16 16  Temp: 98.3 F (36.8 C) 97.5 F (36.4 C)    General: NAD Cardiovascular: RRR Respiratory: CTAB  Discharge Instructions   Discharge Instructions    Diet general    Complete by:  As directed    Increase activity slowly    Complete by:  As directed      Current Discharge Medication List    START taking these medications   Details  bacitracin ointment Apply topically 2 (two) times daily. Qty: 120 g, Refills: 0    feeding supplement, ENSURE ENLIVE, (ENSURE ENLIVE) LIQD Take 237 mLs by mouth 2 (two) times daily between meals. Qty: 237 mL, Refills: 12    ondansetron (ZOFRAN) 4 MG tablet Take 1 tablet (4 mg total) by mouth every 6 (six) hours as needed for nausea. Qty: 20 tablet, Refills: 0      CONTINUE these medications which have CHANGED   Details  clonazePAM (KLONOPIN) 0.5 MG disintegrating tablet DISSOLVE 1 TABLET IN MOUTH EVERY 12 HOURS AS NEEDED SEIZURE Qty: 20 tablet, Refills: 0    dexamethasone (DECADRON) 4 MG tablet Take 0.5 tablets (2 mg total) by mouth daily. Qty: 20 tablet, Refills: 0   Associated Diagnoses: Gastroesophageal reflux disease, esophagitis presence not specified; Glioblastoma multiforme of brain (HCC)    Insulin Glargine (LANTUS) 100 UNIT/ML Solostar Pen Inject 5 Units into the skin daily at 10 pm. Qty: 15 mL, Refills: 0    LORazepam (ATIVAN) 0.5 MG  tablet TAKE 1 TABLET BY MOUTH EVERY 4 TO 6 HOURS AS NEEDED FOR NAUSEA Qty: 20 tablet, Refills: 0      CONTINUE these medications which have NOT CHANGED   Details  acetaminophen (TYLENOL) 650 MG CR tablet Take 1,300 mg by mouth every 8 (eight) hours as needed for pain. Reported on 11/09/2015    benzonatate (TESSALON) 100 MG capsule Take 100 mg by mouth 3 (three) times daily as needed for cough.    levETIRAcetam (KEPPRA) 500 MG tablet Take 3 tablets (1,500 mg total) by mouth 2 (two) times daily. Qty: 180 tablet, Refills: 5    pantoprazole (PROTONIX) 40 MG tablet Take 1 tablet (40 mg total) by mouth 2 (two) times daily. Qty: 60 tablet, Refills: 1   Associated Diagnoses: Gastroesophageal reflux disease without esophagitis    sertraline (ZOLOFT) 100 MG tablet Take 150 mg by mouth at bedtime. Reported on 11/09/2015    SYNTHROID 25 MCG tablet Take 12.5 mcg by mouth daily before breakfast.  Refills: 0    ACCU-CHEK AVIVA PLUS test strip See admin instructions. Refills: 1    B-D ULTRAFINE III SHORT PEN 31G X 8 MM MISC       STOP taking these medications     amoxicillin (AMOXIL) 875 MG tablet        Allergies  Allergen Reactions  . Codeine     REACTION: vomiting  . Prednisone     Panic attacks  . Sulfonamide Derivatives     REACTION:  nausea   Follow-up Information    Rulon Eisenmenger, MD. Schedule an appointment as soon as possible for a visit in 1 week(s).   Specialty:  Hematology and Oncology Contact information: [place] [address] [phone]        MD AT SNF Follow up.   Why:  F/U WITH MD AT SNF           The results of significant diagnostics from this hospitalization (including imaging, microbiology, ancillary and laboratory) are listed below for reference.    Significant Diagnostic Studies: X-ray Chest Pa And Lateral  Result Date: 07/29/2016 CLINICAL DATA:  Brain cancer history of chemotherapy with syncopal episode EXAM: CHEST  2 VIEW  COMPARISON:  06/11/2004 FINDINGS: Right-sided central venous port tip overlies the proximal right atrium. There are low lung volumes bilaterally. Mild bibasilar atelectasis. Mild enlargement of the cardiomediastinal silhouette is augmented by low lung volume. No pneumothorax. IMPRESSION: 1. Low lung volumes 2. Mild enlargement of the cardiomediastinal none, partially related to low lung volumes 3. Streaky atelectasis within the bilateral lung bases. Electronically Signed   By: Donavan Foil M.D.   On: 07/29/2016 23:43   Mr Jeri Cos And Wo Contrast  Result Date: 07/29/2016 CLINICAL DATA:  Initial evaluation for syncope.  History of GBM. EXAM: MRI HEAD WITHOUT AND WITH CONTRAST TECHNIQUE: Multiplanar, multiecho pulse sequences of the brain and surrounding structures were obtained without and with intravenous contrast. CONTRAST:  92mL MULTIHANCE GADOBENATE DIMEGLUMINE 529 MG/ML IV SOLN COMPARISON:  Prior MRI from 05/20/2016. FINDINGS: Brain: Postoperative changes from previous right frontotemporal craniotomy noted. Patient's known GBM again seen. At the level of the right temporal lobe, lesion is overall similar in size and appearance measuring 30 mm in maximal diameter (series 14, image 16), not significantly changed from previous. Patchy post-contrast enhancement about the resection cavity within knee right frontal lobe the sulcal relatively similar measuring approximately 24 x 18 mm (Series 13, image 40). Small focus of nodular enhancement extending posterior to this region also similar (series 13, image 40). Enhancing tumor at the left parasagittal frontal region is stable to slightly more prominent measuring 8 mm (series 14, image 22). Additional patchy enhancement within the cortical/subcortical region of the anterior parasagittal right frontal lobe also stable to slightly more prominent (series 13, image 41). Additional patchy enhancement within the parasagittal right frontal lobe slightly more inferiorly  measures 11 mm, similar to previous (series 13, image 35). No new enhancing tumor identified. Extensive associated T2/FLAIR signal abnormality involving the right greater than left cerebral hemispheres has worsened as compared to previous exam, concerning for progressive infiltrative nonenhancing tumor. This is most evident within the parasagittal frontal lobes bilaterally (series 7, image 15). Additionally, there are new focal areas of diffusion abnormality within this region, also compatible with disease progression (series 11, image 33, 28). Diffusion abnormality about the resection site is similar to previous. Extension into the brainstem again noted, similar to previous. No acute vascular infarct. No midline shift or significant mass effect. Ventricle size is stable without hydrocephalus. No extra-axial fluid collection. Dural thickening and enhancement overlying the right cerebral hemisphere noted, likely postoperative in nature, similar to previous. Vascular: Major intracranial vascular flow voids are maintained. Skull and upper cervical spine: Craniocervical junction normal. Visualized upper cervical spine within normal limits. Bone marrow signal intensity normal. No acute scalp soft tissue abnormality. Sinuses/Orbits: Globes and orbital soft tissues within normal limits. Moderate mucosal thickening throughout the paranasal sinuses. No air-fluid level to suggest  active sinus infection. Bilateral mastoid effusions noted. Inner ear structures grossly normal. IMPRESSION: 1. Overall interval progression of disease as evidenced by worsened T2/FLAIR and diffusion signal abnormality involving the anterior cerebral hemispheres bilaterally, most likely reflecting infiltrative nonenhancing tumor. Enhancing portions of the tumor are relatively similar as compared to most recent MRI from 05/20/2016. 2. No other acute intracranial process identified. 3. Moderate inflammatory paranasal sinus disease with bilateral mastoid  effusions. Electronically Signed   By: Jeannine Boga M.D.   On: 07/29/2016 21:25    Microbiology: No results found for this or any previous visit (from the past 240 hour(s)).   Labs: Basic Metabolic Panel:  Recent Labs Lab 07/29/16 1555  07/30/16 0347 07/31/16 0600 08/01/16 0518 08/02/16 0517 08/03/16 0310  NA 140  --  141 137 139 138 137  K 2.9*  < > 3.9 4.6 3.1* 4.1 3.5  CL 106  --  115* 109 110 106 104  CO2 23  --  21* 22 21* 25 24  GLUCOSE 137*  --  87 90 101* 106* 103*  BUN 23*  --  12 6 7 11 12   CREATININE 1.27*  --  0.78 0.72 0.66 0.84 0.82  CALCIUM 9.0  --  8.0* 8.5* 7.8* 8.6* 8.7*  MG 1.8  --   --   --   --   --   --   < > = values in this interval not displayed. Liver Function Tests: No results for input(s): AST, ALT, ALKPHOS, BILITOT, PROT, ALBUMIN in the last 168 hours. No results for input(s): LIPASE, AMYLASE in the last 168 hours. No results for input(s): AMMONIA in the last 168 hours. CBC:  Recent Labs Lab 07/29/16 1555 07/30/16 0347  WBC 12.2* 8.2  HGB 16.2* 14.0  HCT 47.1* 40.8  MCV 94.6 94.4  PLT 287 253   Cardiac Enzymes:  Recent Labs Lab 07/29/16 2110 07/30/16 0347  TROPONINI <0.03 <0.03   BNP: BNP (last 3 results) No results for input(s): BNP in the last 8760 hours.  ProBNP (last 3 results) No results for input(s): PROBNP in the last 8760 hours.  CBG:  Recent Labs Lab 08/02/16 1145 08/02/16 1721 08/02/16 2028 08/03/16 0738 08/03/16 1124  GLUCAP 119* 148* 122* 97 111*       Signed:  Etsuko Dierolf MD.  [facility] 08/03/2016, 3:32 PM

## 2016-08-03 NOTE — Progress Notes (Signed)
Patient discharged to [facility].  EMS transporting patient.  Room air, denies pain, leaving with personal belongings.  No s/s of distress.  No complaints.

## 2016-08-03 NOTE — Care Management Note (Signed)
Case Management Note  Patient Details  Name: Michelle Duran MRN: [id number] Date of Birth: 09/13/1955  Subjective/Objective:  Glioblastoma multiforme brain                  Action/Plan: Discharge Planning: AVS reviewed: Chart reviewed. Pt's parents have decided on SNF. CSW following for SNF-placement.   PCP Carol Ada MD  Expected Discharge Date: 08/03/2016           Expected Discharge Plan:  [place]  In-House Referral:  Clinical Social Work  Discharge planning Services  CM Consult  Post Acute Care Choice:  NA Choice offered to:  NA  DME Arranged:  N/A DME Agency:  NA  HH Arranged:  NA HH Agency:  NA  Status of Service:  Completed, signed off  If discussed at [employer] of Stay Meetings, dates discussed:    Additional Comments:  Erenest Rasher, RN 08/03/2016, 3:33 PM

## 2016-08-03 NOTE — Progress Notes (Signed)
Called report to [facility] [phone] and gave to [place].

## 2016-08-05 ENCOUNTER — Encounter: Payer: Self-pay | Admitting: Adult Health

## 2016-08-05 ENCOUNTER — Other Ambulatory Visit: Payer: Self-pay | Admitting: *Deleted

## 2016-08-05 ENCOUNTER — Non-Acute Institutional Stay (SKILLED_NURSING_FACILITY): Payer: BC Managed Care – PPO | Admitting: Adult Health

## 2016-08-05 DIAGNOSIS — E139 Other specified diabetes mellitus without complications: Secondary | ICD-10-CM | POA: Diagnosis not present

## 2016-08-05 DIAGNOSIS — K219 Gastro-esophageal reflux disease without esophagitis: Secondary | ICD-10-CM | POA: Diagnosis not present

## 2016-08-05 DIAGNOSIS — R531 Weakness: Secondary | ICD-10-CM

## 2016-08-05 DIAGNOSIS — F411 Generalized anxiety disorder: Secondary | ICD-10-CM | POA: Diagnosis not present

## 2016-08-05 DIAGNOSIS — I951 Orthostatic hypotension: Secondary | ICD-10-CM | POA: Diagnosis not present

## 2016-08-05 DIAGNOSIS — E039 Hypothyroidism, unspecified: Secondary | ICD-10-CM | POA: Diagnosis not present

## 2016-08-05 DIAGNOSIS — R05 Cough: Secondary | ICD-10-CM | POA: Diagnosis not present

## 2016-08-05 DIAGNOSIS — R55 Syncope and collapse: Secondary | ICD-10-CM | POA: Diagnosis not present

## 2016-08-05 DIAGNOSIS — R569 Unspecified convulsions: Secondary | ICD-10-CM | POA: Diagnosis not present

## 2016-08-05 DIAGNOSIS — C719 Malignant neoplasm of brain, unspecified: Secondary | ICD-10-CM | POA: Diagnosis not present

## 2016-08-05 DIAGNOSIS — F329 Major depressive disorder, single episode, unspecified: Secondary | ICD-10-CM | POA: Diagnosis not present

## 2016-08-05 DIAGNOSIS — R059 Cough, unspecified: Secondary | ICD-10-CM

## 2016-08-05 MED ORDER — CLONAZEPAM 0.5 MG PO TBDP: ORAL_TABLET | ORAL | 0 refills | Status: DC

## 2016-08-05 NOTE — Telephone Encounter (Signed)
[facility] #[phone] Fax: [phone] 

## 2016-08-05 NOTE — Progress Notes (Signed)
DATE:  08/05/2016   MRN:  [id number]  BIRTHDAY: 1956-02-23  Facility:  [facility]ursing Home Location:  [place] [facility] [place] Room Number: [id number]  LEVEL OF CARE:  SNF [phone])  Contact Information    Name Relation Home Work Mobile   Kessen,Madison Son [phone]     Emilia Beck Mother [phone]  [phone]   Forestine Chute Father [phone]         Code Status History    Date Active Date Inactive Code Status Order ID Comments User Context   07/29/2016  8:49 PM 08/03/2016  8:47 PM Full Code [id number]  Dron Tanna Furry, MD Inpatient   08/09/2015  2:05 PM 08/11/2015 10:30 PM Full Code [id number]  Kevan Ny Ditty, MD Inpatient   07/24/2015  9:36 PM 07/27/2015  4:28 PM Full Code [id number]  Ivor Costa, MD ED       Chief Complaint  Patient presents with  . Hospitalization Follow-up    HISTORY OF PRESENT ILLNESS:   This is a 60 year old female was been admitted to [facility] on 08/03/16 from [facility] hospitalization 07/29/16 to 08/03/16. She has PMH of glioblastoma multiforme brain on chemotherapy (last dose on 08/01/16), anxiety, seizure disorder and hypothyroidism. She had a syncope  and fell at home. EMS noted BP 80/40 and HR in the 50s. She was having decreased PO intake at home. She was given IV fluids.  She has been admitted for a short-term rehabilitation.   PAST MEDICAL HISTORY:  Past Medical History:  Diagnosis Date  . Anxiety   . Arthritis   . Cancer ([place])    BRAIN  . Complication of anesthesia    Pt was shaking after having epidural  during c-section  . Diabetes mellitus without complication ([place])   . Family history of adverse reaction to anesthesia    mother had PONV  . Fibromyalgia   . Gallstone   . GERD (gastroesophageal reflux disease)   . History of hiatal hernia   . Hypertension   . Hypothyroidism   . Mass of adrenal gland ([place])   . Mitral valve prolapse   . Neoplasm of brain ([place])   . Nocturia   .  Numbness and tingling    fingers and toes  . Panic attacks   . PVC's (premature ventricular contractions)    saw cardiologist Dr. Claiborne Billings > 3 years ago with echo and stress (entered 08/08/2015)  . Seizures ([place])   . Tobacco abuse   . Vertigo   . Vitamin D deficiency   . Wears glasses      CURRENT MEDICATIONS: Reviewed  Patient's Medications  New Prescriptions   No medications on file  Previous Medications   ACCU-CHEK AVIVA PLUS TEST STRIP    See admin instructions.   ACETAMINOPHEN (TYLENOL) 650 MG CR TABLET    Take 1,300 mg by mouth every 8 (eight) hours as needed for pain. Reported on 11/09/2015   B-D ULTRAFINE III SHORT PEN 31G X 8 MM MISC       BACITRACIN OINTMENT    Apply topically 2 (two) times daily.   BENZONATATE (TESSALON) 100 MG CAPSULE    Take 100 mg by mouth 3 (three) times daily as needed for cough.   CLONAZEPAM (KLONOPIN) 0.5 MG DISINTEGRATING TABLET    DISSOLVE 1 TABLET IN MOUTH EVERY 12 HOURS AS NEEDED SEIZURE   DEXAMETHASONE (DECADRON) 4 MG TABLET    Take 0.5 tablets (2 mg total) by mouth daily.   INSULIN GLARGINE (LANTUS)  100 UNIT/ML SOLOSTAR PEN    Inject 5 Units into the skin daily at 10 pm.   LEVETIRACETAM (KEPPRA) 500 MG TABLET    Take 3 tablets (1,500 mg total) by mouth 2 (two) times daily.   LORAZEPAM (ATIVAN) 0.5 MG TABLET    Take 0.5 mg by mouth every 6 (six) hours as needed for anxiety.   NUTRITIONAL SUPPLEMENT LIQD    Take 120 mLs by mouth 2 (two) times daily.   ONDANSETRON (ZOFRAN) 4 MG TABLET    Take 1 tablet (4 mg total) by mouth every 6 (six) hours as needed for nausea.   PANTOPRAZOLE (PROTONIX) 40 MG TABLET    Take 1 tablet (40 mg total) by mouth 2 (two) times daily.   SERTRALINE (ZOLOFT) 100 MG TABLET    Take 150 mg by mouth at bedtime. Take 1-1/2 tablets to = 150 mg   SYNTHROID 25 MCG TABLET    Take 12.5 mcg by mouth daily before breakfast.   Modified Medications   No medications on file  Discontinued Medications   FEEDING SUPPLEMENT, ENSURE ENLIVE,  (ENSURE ENLIVE) LIQD    Take 237 mLs by mouth 2 (two) times daily between meals.   LORAZEPAM (ATIVAN) 0.5 MG TABLET    TAKE 1 TABLET BY MOUTH EVERY 4 TO 6 HOURS AS NEEDED FOR NAUSEA     Allergies  Allergen Reactions  . Codeine     REACTION: vomiting  . Prednisone     Panic attacks  . Sulfonamide Derivatives     REACTION: nausea     REVIEW OF SYSTEMS:  GENERAL: no change in appetite, no fatigue, no weight changes, no fever, chills or weakness EYES: Denies change in vision, dry eyes, eye pain, itching or discharge EARS: Denies change in hearing, ringing in ears, or earache NOSE: Denies nasal congestion or epistaxis MOUTH and THROAT: Denies oral discomfort, gingival pain or bleeding, pain from teeth or hoarseness   RESPIRATORY: no SOB, DOE, wheezing, hemoptysis, +cough CARDIAC: no chest pain, edema or palpitations GI: no abdominal pain, diarrhea, constipation, heart burn, nausea or vomiting GU: Denies dysuria, frequency, hematuria, incontinence, or discharge PSYCHIATRIC: Denies feeling of depression or anxiety. No report of hallucinations, insomnia, paranoia, or agitation   PHYSICAL EXAMINATION  GENERAL APPEARANCE: Well nourished. In no acute distress. Normal body habitus SKIN:  Skin is warm and dry. Left side of head with surgical scar  HEAD: Normal in size and contour. No evidence of trauma EYES: Lids open and close normally. No blepharitis, entropion or ectropion. PERRL. Conjunctivae are clear and sclerae are white. Lenses are without opacity EARS: Pinnae are normal. Patient hears normal voice tunes of the examiner MOUTH and THROAT: Lips are without lesions. Oral mucosa is moist and without lesions. Tongue is normal in shape, size, and color and without lesions NECK: supple, trachea midline, no neck masses, no thyroid tenderness, no thyromegaly LYMPHATICS: no LAN in the neck, no supraclavicular LAN RESPIRATORY: breathing is even & unlabored, BS CTAB CARDIAC: RRR, no murmur,no  extra heart sounds, no edema; right chest Port-A-Cath GI: abdomen soft, normal BS, no masses, no tenderness, no hepatomegaly, no splenomegaly EXTREMITIES:  Able to move 4 extremities PSYCHIATRIC: Alert and oriented X 3. Affect and behavior are appropriate  LABS/RADIOLOGY: Labs reviewed: Basic Metabolic Panel:  Recent Labs  07/29/16 1555  08/01/16 0518 08/02/16 0517 08/03/16 0310  NA 140  < > 139 138 137  K 2.9*  < > 3.1* 4.1 3.5  CL 106  < > 110  106 104  CO2 23  < > 21* 25 24  GLUCOSE 137*  < > 101* 106* 103*  BUN 23*  < > 7 11 12   CREATININE 1.27*  < > 0.66 0.84 0.82  CALCIUM 9.0  < > 7.8* 8.6* 8.7*  MG 1.8  --   --   --   --   < > = values in this interval not displayed. Liver Function Tests:  Recent Labs  06/20/16 1239 07/04/16 0958 07/18/16 1031  AST 18 18 18   ALT 17 26 25   ALKPHOS 61 66 79  BILITOT 0.44 0.44 0.51  PROT 5.8* 5.8* 6.0*  ALBUMIN 3.1* 3.1* 3.0*    Recent Labs  02/08/16 0300  LIPASE 29   CBC:  Recent Labs  06/20/16 1239 07/04/16 0958 07/18/16 1031 07/29/16 1555 07/30/16 0347  WBC 10.3 7.2 11.3* 12.2* 8.2  NEUTROABS 8.2* 4.7 8.6*  --   --   HGB 13.7 14.1 14.2 16.2* 14.0  HCT 40.0 41.9 40.6 47.1* 40.8  MCV 95.7 95.9 94.4 94.6 94.4  PLT 211 220 204 287 253   Cardiac Enzymes:  Recent Labs  07/29/16 2110 07/30/16 0347  TROPONINI <0.03 <0.03   CBG:  Recent Labs  08/03/16 0738 08/03/16 1124 08/03/16 1708  GLUCAP 97 111* 131*      X-ray Chest Pa And Lateral  Result Date: 07/29/2016 CLINICAL DATA:  Brain cancer history of chemotherapy with syncopal episode EXAM: CHEST  2 VIEW COMPARISON:  06/11/2004 FINDINGS: Right-sided central venous port tip overlies the proximal right atrium. There are low lung volumes bilaterally. Mild bibasilar atelectasis. Mild enlargement of the cardiomediastinal silhouette is augmented by low lung volume. No pneumothorax. IMPRESSION: 1. Low lung volumes 2. Mild enlargement of the cardiomediastinal  none, partially related to low lung volumes 3. Streaky atelectasis within the bilateral lung bases. Electronically Signed   By: Donavan Foil M.D.   On: 07/29/2016 23:43   Mr Jeri Cos And Wo Contrast  Result Date: 07/29/2016 CLINICAL DATA:  Initial evaluation for syncope.  History of GBM. EXAM: MRI HEAD WITHOUT AND WITH CONTRAST TECHNIQUE: Multiplanar, multiecho pulse sequences of the brain and surrounding structures were obtained without and with intravenous contrast. CONTRAST:  27mL MULTIHANCE GADOBENATE DIMEGLUMINE 529 MG/ML IV SOLN COMPARISON:  Prior MRI from 05/20/2016. FINDINGS: Brain: Postoperative changes from previous right frontotemporal craniotomy noted. Patient's known GBM again seen. At the level of the right temporal lobe, lesion is overall similar in size and appearance measuring 30 mm in maximal diameter (series 14, image 16), not significantly changed from previous. Patchy post-contrast enhancement about the resection cavity within knee right frontal lobe the sulcal relatively similar measuring approximately 24 x 18 mm (Series 13, image 40). Small focus of nodular enhancement extending posterior to this region also similar (series 13, image 40). Enhancing tumor at the left parasagittal frontal region is stable to slightly more prominent measuring 8 mm (series 14, image 22). Additional patchy enhancement within the cortical/subcortical region of the anterior parasagittal right frontal lobe also stable to slightly more prominent (series 13, image 41). Additional patchy enhancement within the parasagittal right frontal lobe slightly more inferiorly measures 11 mm, similar to previous (series 13, image 35). No new enhancing tumor identified. Extensive associated T2/FLAIR signal abnormality involving the right greater than left cerebral hemispheres has worsened as compared to previous exam, concerning for progressive infiltrative nonenhancing tumor. This is most evident within the parasagittal  frontal lobes bilaterally (series 7, image 15). Additionally, there are new  focal areas of diffusion abnormality within this region, also compatible with disease progression (series 11, image 33, 28). Diffusion abnormality about the resection site is similar to previous. Extension into the brainstem again noted, similar to previous. No acute vascular infarct. No midline shift or significant mass effect. Ventricle size is stable without hydrocephalus. No extra-axial fluid collection. Dural thickening and enhancement overlying the right cerebral hemisphere noted, likely postoperative in nature, similar to previous. Vascular: Major intracranial vascular flow voids are maintained. Skull and upper cervical spine: Craniocervical junction normal. Visualized upper cervical spine within normal limits. Bone marrow signal intensity normal. No acute scalp soft tissue abnormality. Sinuses/Orbits: Globes and orbital soft tissues within normal limits. Moderate mucosal thickening throughout the paranasal sinuses. No air-fluid level to suggest active sinus infection. Bilateral mastoid effusions noted. Inner ear structures grossly normal. IMPRESSION: 1. Overall interval progression of disease as evidenced by worsened T2/FLAIR and diffusion signal abnormality involving the anterior cerebral hemispheres bilaterally, most likely reflecting infiltrative nonenhancing tumor. Enhancing portions of the tumor are relatively similar as compared to most recent MRI from 05/20/2016. 2. No other acute intracranial process identified. 3. Moderate inflammatory paranasal sinus disease with bilateral mastoid effusions. Electronically Signed   By: Jeannine Boga M.D.   On: 07/29/2016 21:25    ASSESSMENT/PLAN:  Generalized weakness - for rehabilitation, PT and OT, for therapeutic strengthening exercises; fall precaution  Syncope - likely due to glioblastoma multiforme and hypotension; follow-up with Dr. Lindi Adie, oncology  Glioblastoma  multiforme of the brain - S/P removal of glioblastoma with overall interval progression of disease as evidenced by worsening T2/FLAIR and diffusion signal abnormality involving the anterior cerebral hemispheres bilaterally, most likely reflecting infiltrative nonenhancing tumor. Oncology was consulted and will follow-up as outpatient. Oncology with percent MRI findings to brain tumor board to verify whether there is progression in the disease of brain then decide for further treatment versus hospice; continue Decadron 4 mg 1/2 tab = 2 mg by mouth daily, Keppra 500 mg take 3 tablets = 1500 mg by mouth twice a day; check CBC and BMP  Orthostatic hypotension - IVF was given; will monitor  Hypothyroidism - continue Synthroid 25 g take 1/2 tab = 12.5 mg by mouth daily Lab Results  Component Value Date   TSH 1.900 07/30/2016   Diabetes mellitus, type II - has poor appetite, diet has been liberalized; continue Lantus 100 units/mL. 5 units subcutaneous daily at bedtime Lab Results  Component Value Date   HGBA1C 6.8 (H) 07/29/2016   Depression - mood is stable; continue Zoloft 100 mg take 1 1/2 tabs = 150 mg by mouth daily at bedtime   GERD - continue Protonix 40 mg 1 tab by mouth twice a day  Seizure - - continue Keppra 500 mg take 3 tabs = 1500 mg by mouth twice a day  Anxiety - discontinue Klonopin; continue Ativan 0.5 mg 1 tab by mouth every 4 hours when necessary  Cough - continue Tessalon [place] 100 mg 1 capsule by mouth 3 times a day when necessary and make: Check CBC     Goals of care:  Short-term rehabilitation    Calianna Kim C. [place] - NP [employer] [phone]

## 2016-08-06 ENCOUNTER — Ambulatory Visit: Payer: Self-pay | Admitting: Hematology and Oncology

## 2016-08-06 ENCOUNTER — Encounter: Payer: Self-pay | Admitting: Internal Medicine

## 2016-08-06 ENCOUNTER — Other Ambulatory Visit: Payer: Self-pay

## 2016-08-06 ENCOUNTER — Non-Acute Institutional Stay (SKILLED_NURSING_FACILITY): Payer: BC Managed Care – PPO | Admitting: Internal Medicine

## 2016-08-06 DIAGNOSIS — C719 Malignant neoplasm of brain, unspecified: Secondary | ICD-10-CM | POA: Diagnosis not present

## 2016-08-06 DIAGNOSIS — E139 Other specified diabetes mellitus without complications: Secondary | ICD-10-CM

## 2016-08-06 DIAGNOSIS — F329 Major depressive disorder, single episode, unspecified: Secondary | ICD-10-CM | POA: Diagnosis not present

## 2016-08-06 DIAGNOSIS — E039 Hypothyroidism, unspecified: Secondary | ICD-10-CM | POA: Diagnosis not present

## 2016-08-06 DIAGNOSIS — K219 Gastro-esophageal reflux disease without esophagitis: Secondary | ICD-10-CM | POA: Diagnosis not present

## 2016-08-06 DIAGNOSIS — J0100 Acute maxillary sinusitis, unspecified: Secondary | ICD-10-CM

## 2016-08-06 DIAGNOSIS — E440 Moderate protein-calorie malnutrition: Secondary | ICD-10-CM | POA: Diagnosis not present

## 2016-08-06 DIAGNOSIS — R5381 Other malaise: Secondary | ICD-10-CM

## 2016-08-06 LAB — BASIC METABOLIC PANEL
BUN: 12 mg/dL (ref 4–21)
CREATININE: 0.9 mg/dL (ref 0.5–1.1)
GLUCOSE: 101 mg/dL
POTASSIUM: 3.5 mmol/L (ref 3.4–5.3)
Sodium: 142 mmol/L (ref 137–147)

## 2016-08-06 LAB — CBC AND DIFFERENTIAL
HCT: 44 % (ref 36–46)
Hemoglobin: 15.5 g/dL (ref 12.0–16.0)
Neutrophils Absolute: 5 /uL
PLATELETS: 269 10*3/uL (ref 150–399)
WBC: 8.9 10*3/mL

## 2016-08-06 NOTE — Progress Notes (Signed)
LOCATION: [facility]  PCP: Reginia Naas, MD   Code Status: Full Code  Goals of care: Advanced Directive information Advanced Directives 07/29/2016  Does Patient Have a Medical Advance Directive? No  Type of Advance Directive [place]  Does patient want to make changes to medical advance directive? -  Copy of [place] in Chart? No - copy requested  Would patient like information on creating a medical advance directive? No - Patient declined       Extended Emergency Contact Information Primary Emergency Contact: [place]  [place] Phone: [phone] Relation: Son Secondary Emergency Contact: Emilia Beck Address: [address]          [place], [place] [address] Johnnette Litter of [place] Phone: [phone] Mobile Phone: [phone] Relation: Mother Father: Osvaldo Human of [place] Phone: [phone]   Allergies  Allergen Reactions  . Codeine     REACTION: vomiting  . Prednisone     Panic attacks  . Sulfonamide Derivatives     REACTION: nausea    Chief Complaint  Patient presents with  . New Admit To SNF    New Admission Visit     HPI:  Patient is a 60 y.o. female seen today for short term rehabilitation post hospital admission from 07/29/16-08/03/16 post syncope with orthostatic hypotension. She received iv fluids. Echocardiogram showed EF 60-65% with grade 2 diastolic dysfunction. She has PMH of glioblastoma multiforme of brain, hypothyroidism, HTN, GERD, depression among others. She is seen in her room today.   Review of Systems:  Constitutional: Negative for fever, chill. Positive for generalized weakness.  HENT: Negative for headache, congestion, difficulty swallowing. Positive for nasal discharge.   Eyes: Negative for blurred vision, double vision and discharge.  Respiratory: Negative for shortness of breath and wheezing. Positive for cough  with yellow phlegm x 1 week per patient.   Cardiovascular: Negative for chest pain, palpitation, leg swelling.  Gastrointestinal: Negative for heartburn, nausea, vomiting, abdominal pain. Last bowel movement was a week ago.  Genitourinary: Negative for dysuria and flank pain.  Musculoskeletal: Negative for back pain, fall in the facility.  Skin: Negative for itching, rash.  Neurological: Negative for dizziness. Psychiatric/Behavioral: Negative for depression   Past Medical History:  Diagnosis Date  . Anxiety   . Arthritis   . Cancer ([place])    BRAIN  . Complication of anesthesia    Pt was shaking after having epidural  during c-section  . Diabetes mellitus without complication ([place])   . Family history of adverse reaction to anesthesia    mother had PONV  . Fibromyalgia   . Gallstone   . GERD (gastroesophageal reflux disease)   . History of hiatal hernia   . Hypertension   . Hypothyroidism   . Mass of adrenal gland ([place])   . Mitral valve prolapse   . Neoplasm of brain ([place])   . Nocturia   . Numbness and tingling    fingers and toes  . Panic attacks   . PVC's (premature ventricular contractions)    saw cardiologist Dr. Claiborne Billings > 3 years ago with echo and stress (entered 08/08/2015)  . Seizures ([place])   . Tobacco abuse   . Vertigo   . Vitamin D deficiency   . Wears glasses    Past Surgical History:  Procedure Laterality Date  . CESAREAN SECTION    . CRANIOTOMY Right 08/09/2015   Procedure: Right Frontotemporal craniotomy for tumor resection with stereotactic navigation;  Surgeon: Kevan Ny Ditty, MD;  Location: [facility];  Service: Neurosurgery;  Laterality: Right;  Right Frontotemporal craniotomy for tumor resection with stereotactic navigation  . esophagus stretched    . MULTIPLE TOOTH EXTRACTIONS     Social History:   reports that she quit smoking about a year ago. Her smoking use included Cigarettes. She has a 41.00 pack-year smoking history. She has never used  smokeless tobacco. She reports that she does not drink alcohol or use drugs.  Family History  Problem Relation Age of Onset  . Hypertension Father   . Glaucoma    . Ovarian cancer Cousin   . Breast cancer Paternal Grandmother     Medications:   Medication List       Accurate as of 08/06/16 12:23 PM. Always use your most recent med list.          ACCU-CHEK AVIVA PLUS test strip Generic drug:  glucose blood See admin instructions.   acetaminophen 650 MG CR tablet Commonly known as:  TYLENOL Take 1,300 mg by mouth every 8 (eight) hours as needed for pain. Reported on 11/09/2015   B-D ULTRAFINE III SHORT PEN 31G X 8 MM Misc Generic drug:  Insulin Pen Needle   bacitracin ointment Apply topically 2 (two) times daily.   benzonatate 100 MG capsule Commonly known as:  TESSALON Take 100 mg by mouth 3 (three) times daily as needed for cough.   dexamethasone 4 MG tablet Commonly known as:  DECADRON Take 0.5 tablets (2 mg total) by mouth daily.   Insulin Glargine 100 UNIT/ML Solostar Pen Commonly known as:  LANTUS Inject 5 Units into the skin daily at 10 pm.   levETIRAcetam 500 MG tablet Commonly known as:  KEPPRA Take 3 tablets (1,500 mg total) by mouth 2 (two) times daily.   LORazepam 0.5 MG tablet Commonly known as:  ATIVAN Take 0.5 mg by mouth every 6 (six) hours as needed for anxiety.   NUTRITIONAL SUPPLEMENT Liqd Take 120 mLs by mouth 2 (two) times daily.   ondansetron 4 MG tablet Commonly known as:  ZOFRAN Take 1 tablet (4 mg total) by mouth every 6 (six) hours as needed for nausea.   pantoprazole 40 MG tablet Commonly known as:  PROTONIX Take 1 tablet (40 mg total) by mouth 2 (two) times daily.   sertraline 100 MG tablet Commonly known as:  ZOLOFT Take 150 mg by mouth at bedtime. Take 1-1/2 tablets to = 150 mg   SYNTHROID 25 MCG tablet Generic drug:  levothyroxine Take 12.5 mcg by mouth daily before breakfast.       Immunizations:  There is no  immunization history on file for this patient.   Physical Exam: Vitals:   08/06/16 1218  BP: 133/78  Pulse: 60  Resp: 18  Temp: 98.7 F (37.1 C)  TempSrc: Oral  SpO2: 97%  Weight: 178 lb (80.7 kg)  Height: 5\' 3"  (1.6 m)   Body mass index is 31.53 kg/m.  General- adult, chronically ill appearing, in no acute distress Head- normocephalic, atraumatic Nose- + maxillary sinus tenderness, no nasal discharge Throat- moist mucus membrane, no oral lesions Eyes- PERRLA, EOMI, no pallor, no icterus, no discharge, normal conjunctiva, normal sclera Neck- no cervical lymphadenopathy Cardiovascular- normal s1,s2, no murmur Respiratory- bilateral clear to auscultation, no wheeze, no rhonchi, no crackles, no use of accessory muscles Abdomen- bowel sounds present, soft, non tender Musculoskeletal- able to move all 4 extremities, generalized weakness, no leg edema good dorsalis pedis Neurological-  alert and oriented to person, place and time Skin- warm and dry easy bruising to both arms, thin fragile skin Psychiatry- normal mood and affect    Labs reviewed: Basic Metabolic Panel:  Recent Labs  07/29/16 1555  08/01/16 0518 08/02/16 0517 08/03/16 0310  NA 140  < > 139 138 137  K 2.9*  < > 3.1* 4.1 3.5  CL 106  < > 110 106 104  CO2 23  < > 21* 25 24  GLUCOSE 137*  < > 101* 106* 103*  BUN 23*  < > 7 11 12   CREATININE 1.27*  < > 0.66 0.84 0.82  CALCIUM 9.0  < > 7.8* 8.6* 8.7*  MG 1.8  --   --   --   --   < > = values in this interval not displayed. Liver Function Tests:  Recent Labs  06/20/16 1239 07/04/16 0958 07/18/16 1031  AST 18 18 18   ALT 17 26 25   ALKPHOS 61 66 79  BILITOT 0.44 0.44 0.51  PROT 5.8* 5.8* 6.0*  ALBUMIN 3.1* 3.1* 3.0*    Recent Labs  02/08/16 0300  LIPASE 29   No results for input(s): AMMONIA in the last 8760 hours. CBC:  Recent Labs  06/20/16 1239 07/04/16 0958 07/18/16 1031 07/29/16 1555 07/30/16 0347  WBC 10.3 7.2 11.3* 12.2* 8.2    NEUTROABS 8.2* 4.7 8.6*  --   --   HGB 13.7 14.1 14.2 16.2* 14.0  HCT 40.0 41.9 40.6 47.1* 40.8  MCV 95.7 95.9 94.4 94.6 94.4  PLT 211 220 204 287 253   Cardiac Enzymes:  Recent Labs  07/29/16 2110 07/30/16 0347  TROPONINI <0.03 <0.03   BNP: Invalid input(s): POCBNP CBG:  Recent Labs  08/03/16 0738 08/03/16 1124 08/03/16 1708  GLUCAP 97 111* 131*    Radiological Exams: X-ray Chest Pa And Lateral  Result Date: 07/29/2016 CLINICAL DATA:  Brain cancer history of chemotherapy with syncopal episode EXAM: CHEST  2 VIEW COMPARISON:  06/11/2004 FINDINGS: Right-sided central venous port tip overlies the proximal right atrium. There are low lung volumes bilaterally. Mild bibasilar atelectasis. Mild enlargement of the cardiomediastinal silhouette is augmented by low lung volume. No pneumothorax. IMPRESSION: 1. Low lung volumes 2. Mild enlargement of the cardiomediastinal none, partially related to low lung volumes 3. Streaky atelectasis within the bilateral lung bases. Electronically Signed   By: Donavan Foil M.D.   On: 07/29/2016 23:43   Mr Jeri Cos And Wo Contrast  Result Date: 07/29/2016 CLINICAL DATA:  Initial evaluation for syncope.  History of GBM. EXAM: MRI HEAD WITHOUT AND WITH CONTRAST TECHNIQUE: Multiplanar, multiecho pulse sequences of the brain and surrounding structures were obtained without and with intravenous contrast. CONTRAST:  70mL MULTIHANCE GADOBENATE DIMEGLUMINE 529 MG/ML IV SOLN COMPARISON:  Prior MRI from 05/20/2016. FINDINGS: Brain: Postoperative changes from previous right frontotemporal craniotomy noted. Patient's known GBM again seen. At the level of the right temporal lobe, lesion is overall similar in size and appearance measuring 30 mm in maximal diameter (series 14, image 16), not significantly changed from previous. Patchy post-contrast enhancement about the resection cavity within knee right frontal lobe the sulcal relatively similar measuring approximately  24 x 18 mm (Series 13, image 40). Small focus of nodular enhancement extending posterior to this region also similar (series 13, image 40). Enhancing tumor at the left parasagittal frontal region is stable to slightly more prominent measuring 8 mm (series 14, image 22). Additional patchy enhancement within the cortical/subcortical region of the  anterior parasagittal right frontal lobe also stable to slightly more prominent (series 13, image 41). Additional patchy enhancement within the parasagittal right frontal lobe slightly more inferiorly measures 11 mm, similar to previous (series 13, image 35). No new enhancing tumor identified. Extensive associated T2/FLAIR signal abnormality involving the right greater than left cerebral hemispheres has worsened as compared to previous exam, concerning for progressive infiltrative nonenhancing tumor. This is most evident within the parasagittal frontal lobes bilaterally (series 7, image 15). Additionally, there are new focal areas of diffusion abnormality within this region, also compatible with disease progression (series 11, image 33, 28). Diffusion abnormality about the resection site is similar to previous. Extension into the brainstem again noted, similar to previous. No acute vascular infarct. No midline shift or significant mass effect. Ventricle size is stable without hydrocephalus. No extra-axial fluid collection. Dural thickening and enhancement overlying the right cerebral hemisphere noted, likely postoperative in nature, similar to previous. Vascular: Major intracranial vascular flow voids are maintained. Skull and upper cervical spine: Craniocervical junction normal. Visualized upper cervical spine within normal limits. Bone marrow signal intensity normal. No acute scalp soft tissue abnormality. Sinuses/Orbits: Globes and orbital soft tissues within normal limits. Moderate mucosal thickening throughout the paranasal sinuses. No air-fluid level to suggest active  sinus infection. Bilateral mastoid effusions noted. Inner ear structures grossly normal. IMPRESSION: 1. Overall interval progression of disease as evidenced by worsened T2/FLAIR and diffusion signal abnormality involving the anterior cerebral hemispheres bilaterally, most likely reflecting infiltrative nonenhancing tumor. Enhancing portions of the tumor are relatively similar as compared to most recent MRI from 05/20/2016. 2. No other acute intracranial process identified. 3. Moderate inflammatory paranasal sinus disease with bilateral mastoid effusions. Electronically Signed   By: Jeannine Boga M.D.   On: 07/29/2016 21:25    Assessment/Plan  Physical deconditioning With generalized weakness. We will have her work with physical therapy and occupational therapy as tolerated to help regain her strength and balance. Spoke with patient's mother who had further reviewed Plans with the patient's oncologist and the goal of care is to focus for comfort care at present. Palliative care consult.  Glioblastoma multiforme of the brain Continue Decadron for now and continue Keppra for seizure prophylaxis. Follow with oncology.  Acute sinusitis Start Augmentin 875 mg by mouth every 12 hours for 5 days. Monitor clinically.  Protein calorie malnutrition Continue her med Pass supplement. Registered dietitian to follow. Monitor weight.   Gastroesophageal reflux disease Control symptom. Continue Protonix 40 mg daily.  Hypothyroidism Continue home regimen levothyroxine.  Chronic depression Continue her Zoloft. No changes made.  Diabetes mellitus With her being on decadron. Monitor blood sugar readings. Continue Lantus 5 units daily at the time  Goals of care: short term rehabilitation   Labs/tests ordered: CBC, CMP  Family/ staff Communication: reviewed care plan with patient, her mother and nursing supervisor    Blanchie Serve, MD Internal Medicine [place], [place] [address] Cell Phone (Monday-Friday 8 am - 5 pm): [phone] On Call: [phone] and follow prompts after 5 pm and on weekends Office Phone: [phone] Office Fax: [phone]

## 2016-08-06 NOTE — Progress Notes (Signed)
Referral for hospice entered and called in. Left vm for hospice to contact family.call back number provided.

## 2016-08-09 ENCOUNTER — Encounter: Payer: Self-pay | Admitting: Adult Health

## 2016-08-09 ENCOUNTER — Non-Acute Institutional Stay (SKILLED_NURSING_FACILITY): Payer: BC Managed Care – PPO | Admitting: Adult Health

## 2016-08-09 DIAGNOSIS — F32A Depression, unspecified: Secondary | ICD-10-CM

## 2016-08-09 DIAGNOSIS — F419 Anxiety disorder, unspecified: Secondary | ICD-10-CM

## 2016-08-09 DIAGNOSIS — E139 Other specified diabetes mellitus without complications: Secondary | ICD-10-CM | POA: Diagnosis not present

## 2016-08-09 DIAGNOSIS — C719 Malignant neoplasm of brain, unspecified: Secondary | ICD-10-CM | POA: Diagnosis not present

## 2016-08-09 DIAGNOSIS — F329 Major depressive disorder, single episode, unspecified: Secondary | ICD-10-CM

## 2016-08-09 DIAGNOSIS — E039 Hypothyroidism, unspecified: Secondary | ICD-10-CM

## 2016-08-09 NOTE — Progress Notes (Signed)
DATE:  08/09/2016   MRN:  [id number]  BIRTHDAY: 1955/10/09  Facility:  Nursing Home Location:  [place] [facility] [place] Room Number: [id number]  LEVEL OF CARE:  SNF [phone])  Contact Information    Name Relation Home Work [place] Mother [phone]  [phone]   [facility] Father [phone]         Code Status History    Date Active Date Inactive Code Status Order ID Comments User Context   07/29/2016  8:49 PM 08/03/2016  8:47 PM Full Code [id number]  Dron Tanna Furry, MD Inpatient   08/09/2015  2:05 PM 08/11/2015 10:30 PM Full Code [id number]  Kevan Ny Ditty, MD Inpatient   07/24/2015  9:36 PM 07/27/2015  4:28 PM Full Code [id number]  Ivor Costa, MD ED    Advance Directive Documentation   Flowsheet Row Most Recent Value  Type of Advance Directive  Out of facility DNR (pink MOST or yellow form)  Pre-existing out of facility DNR order (yellow form or pink MOST form)  No data  "MOST" Form in Place?  No data       Chief Complaint  Patient presents with  . Discharge Note    HISTORY OF PRESENT ILLNESS:  This is a 60 year old female who is for discharge to [facility] for hospice care.  She has been admitted to [facility] on 08/03/16 from [facility] admission dates 07/29/16 thru 08/03/16. She had a syncope  and fell at home. EMS noted BP 80/40 and HR in the 50s. She was having decreased PO intake at home. She was given IV fluids. She has PMH of glioblastoma multiforme brain on chemotherapy (last dose on 08/01/16), anxiety, seizure disorder and hypothyroidism.   Patient and family have decided to discharge to [facility] for hospice care.    PAST MEDICAL HISTORY:  Past Medical History:  Diagnosis Date  . Anxiety   . Arthritis   . Cancer ([place])    BRAIN  . Complication of anesthesia    Pt was shaking after having epidural  during c-section  . Diabetes mellitus without complication  ([place])   . Family history of adverse reaction to anesthesia    mother had PONV  . Fibromyalgia   . Gallstone   . GERD (gastroesophageal reflux disease)   . History of hiatal hernia   . Hypertension   . Hypothyroidism   . Mass of adrenal gland ([place])   . Mitral valve prolapse   . Neoplasm of brain ([place])   . Nocturia   . Numbness and tingling    fingers and toes  . Panic attacks   . PVC's (premature ventricular contractions)    saw cardiologist Dr. Claiborne Billings > 3 years ago with echo and stress (entered 08/08/2015)  . Seizures ([place])   . Tobacco abuse   . Vertigo   . Vitamin D deficiency   . Wears glasses      CURRENT MEDICATIONS: Reviewed  Patient's Medications  New Prescriptions   No medications on file  Previous Medications   ACCU-CHEK AVIVA PLUS TEST STRIP    See admin instructions.   ACETAMINOPHEN (TYLENOL) 650 MG CR TABLET    Take 1,300 mg by mouth every 8 (eight) hours as needed for pain. Reported on 11/09/2015   AMOXICILLIN-CLAVULANATE (AUGMENTIN) 875-125 MG TABLET    Take 1 tablet by mouth 2 (two) times daily.   B-D ULTRAFINE III SHORT PEN 31G X 8 MM MISC  BACITRACIN OINTMENT    Apply topically 2 (two) times daily.   BENZONATATE (TESSALON) 100 MG CAPSULE    Take 100 mg by mouth 3 (three) times daily as needed for cough.   DEXAMETHASONE (DECADRON) 4 MG TABLET    Take 0.5 tablets (2 mg total) by mouth daily.   INSULIN GLARGINE (LANTUS) 100 UNIT/ML SOLOSTAR PEN    Inject 5 Units into the skin daily at 10 pm.   LEVETIRACETAM (KEPPRA) 500 MG TABLET    Take 3 tablets (1,500 mg total) by mouth 2 (two) times daily.   LORAZEPAM (ATIVAN) 0.5 MG TABLET    Take 0.5 mg by mouth every 6 (six) hours as needed for anxiety.   NUTRITIONAL SUPPLEMENT LIQD    Take 120 mLs by mouth 2 (two) times daily. [employer]   ONDANSETRON (ZOFRAN) 4 MG TABLET    Take 1 tablet (4 mg total) by mouth every 6 (six) hours as needed for nausea.   PANTOPRAZOLE (PROTONIX) 40 MG TABLET    Take 1 tablet (40 mg total)  by mouth 2 (two) times daily.   SACCHAROMYCES BOULARDII (FLORASTOR) 250 MG CAPSULE    Take 250 mg by mouth 2 (two) times daily.   SERTRALINE (ZOLOFT) 100 MG TABLET    Take 150 mg by mouth at bedtime. Take 1-1/2 tablets to = 150 mg   SYNTHROID 25 MCG TABLET    Take 12.5 mcg by mouth daily before breakfast.   Modified Medications   No medications on file  Discontinued Medications   No medications on file     Allergies  Allergen Reactions  . Codeine     REACTION: vomiting  . Prednisone     Panic attacks  . Sulfonamide Derivatives     REACTION: nausea     REVIEW OF SYSTEMS:  GENERAL: no change in appetite, no fatigue, no weight changes, no fever, chills or weakness EYES: Denies change in vision, dry eyes, eye pain, itching or discharge EARS: Denies change in hearing, ringing in ears, or earache NOSE: Denies nasal congestion or epistaxis MOUTH and THROAT: Denies oral discomfort, gingival pain or bleeding, pain from teeth or hoarseness   RESPIRATORY: no cough, SOB, DOE, wheezing, hemoptysis CARDIAC: no chest pain, edema or palpitations GI: no abdominal pain, diarrhea, constipation, heart burn, nausea or vomiting GU: Denies dysuria, frequency, hematuria, incontinence, or discharge PSYCHIATRIC: Denies feeling of depression or anxiety. No report of hallucinations, insomnia, paranoia, or agitation     PHYSICAL EXAMINATION  GENERAL APPEARANCE: Well nourished. In no acute distress. Obese SKIN:  Skin is warm and dry. There are no suspicious lesions or rash HEAD: Normal in size and contour. No evidence of trauma EYES: Lids open and close normally. No blepharitis, entropion or ectropion. PERRL. Conjunctivae are clear and sclerae are white. Lenses are without opacity EARS: Pinnae are normal. Patient hears normal voice tunes of the examiner MOUTH and THROAT: Lips are without lesions. Oral mucosa is moist and without lesions. Tongue is normal in shape, size, and color and without  lesions NECK: supple, trachea midline, no neck masses, no thyroid tenderness, no thyromegaly LYMPHATICS: no LAN in the neck, no supraclavicular LAN RESPIRATORY: breathing is even & unlabored, BS CTAB CARDIAC: RRR, no murmur,no extra heart sounds, no edema GI: abdomen soft, normal BS, no masses, no tenderness, no hepatomegaly, no splenomegaly EXTREMITIES:  Able to move X 4 extremities PSYCHIATRIC: Alert and oriented X 3. Affect and behavior are appropriate   LABS/RADIOLOGY: Labs reviewed: Basic Metabolic Panel:  Recent  Labs  07/29/16 1555  08/01/16 0518 08/02/16 0517 08/03/16 0310 08/06/16  NA 140  < > 139 138 137 142  K 2.9*  < > 3.1* 4.1 3.5 3.5  CL 106  < > 110 106 104  --   CO2 23  < > 21* 25 24  --   GLUCOSE 137*  < > 101* 106* 103*  --   BUN 23*  < > 7 11 12 12   CREATININE 1.27*  < > 0.66 0.84 0.82 0.9  CALCIUM 9.0  < > 7.8* 8.6* 8.7*  --   MG 1.8  --   --   --   --   --   < > = values in this interval not displayed. Liver Function Tests:  Recent Labs  06/20/16 1239 07/04/16 0958 07/18/16 1031  AST 18 18 18   ALT 17 26 25   ALKPHOS 61 66 79  BILITOT 0.44 0.44 0.51  PROT 5.8* 5.8* 6.0*  ALBUMIN 3.1* 3.1* 3.0*    Recent Labs  02/08/16 0300  LIPASE 29    CBC:  Recent Labs  07/04/16 0958 07/18/16 1031 07/29/16 1555 07/30/16 0347 08/06/16  WBC 7.2 11.3* 12.2* 8.2 8.9  NEUTROABS 4.7 8.6*  --   --  5  HGB 14.1 14.2 16.2* 14.0 15.5  HCT 41.9 40.6 47.1* 40.8 44  MCV 95.9 94.4 94.6 94.4  --   PLT 220 204 287 253 269    Cardiac Enzymes:  Recent Labs  07/29/16 2110 07/30/16 0347  TROPONINI <0.03 <0.03   CBG:  Recent Labs  08/03/16 0738 08/03/16 1124 08/03/16 1708  GLUCAP 97 111* 131*     X-ray Chest Pa And Lateral  Result Date: 07/29/2016 CLINICAL DATA:  Brain cancer history of chemotherapy with syncopal episode EXAM: CHEST  2 VIEW COMPARISON:  06/11/2004 FINDINGS: Right-sided central venous port tip overlies the proximal right atrium.  There are low lung volumes bilaterally. Mild bibasilar atelectasis. Mild enlargement of the cardiomediastinal silhouette is augmented by low lung volume. No pneumothorax. IMPRESSION: 1. Low lung volumes 2. Mild enlargement of the cardiomediastinal none, partially related to low lung volumes 3. Streaky atelectasis within the bilateral lung bases. Electronically Signed   By: Donavan Foil M.D.   On: 07/29/2016 23:43   Mr Jeri Cos And Wo Contrast  Result Date: 07/29/2016 CLINICAL DATA:  Initial evaluation for syncope.  History of GBM. EXAM: MRI HEAD WITHOUT AND WITH CONTRAST TECHNIQUE: Multiplanar, multiecho pulse sequences of the brain and surrounding structures were obtained without and with intravenous contrast. CONTRAST:  87mL MULTIHANCE GADOBENATE DIMEGLUMINE 529 MG/ML IV SOLN COMPARISON:  Prior MRI from 05/20/2016. FINDINGS: Brain: Postoperative changes from previous right frontotemporal craniotomy noted. Patient's known GBM again seen. At the level of the right temporal lobe, lesion is overall similar in size and appearance measuring 30 mm in maximal diameter (series 14, image 16), not significantly changed from previous. Patchy post-contrast enhancement about the resection cavity within knee right frontal lobe the sulcal relatively similar measuring approximately 24 x 18 mm (Series 13, image 40). Small focus of nodular enhancement extending posterior to this region also similar (series 13, image 40). Enhancing tumor at the left parasagittal frontal region is stable to slightly more prominent measuring 8 mm (series 14, image 22). Additional patchy enhancement within the cortical/subcortical region of the anterior parasagittal right frontal lobe also stable to slightly more prominent (series 13, image 41). Additional patchy enhancement within the parasagittal right frontal lobe slightly more inferiorly measures 11  mm, similar to previous (series 13, image 35). No new enhancing tumor identified. Extensive  associated T2/FLAIR signal abnormality involving the right greater than left cerebral hemispheres has worsened as compared to previous exam, concerning for progressive infiltrative nonenhancing tumor. This is most evident within the parasagittal frontal lobes bilaterally (series 7, image 15). Additionally, there are new focal areas of diffusion abnormality within this region, also compatible with disease progression (series 11, image 33, 28). Diffusion abnormality about the resection site is similar to previous. Extension into the brainstem again noted, similar to previous. No acute vascular infarct. No midline shift or significant mass effect. Ventricle size is stable without hydrocephalus. No extra-axial fluid collection. Dural thickening and enhancement overlying the right cerebral hemisphere noted, likely postoperative in nature, similar to previous. Vascular: Major intracranial vascular flow voids are maintained. Skull and upper cervical spine: Craniocervical junction normal. Visualized upper cervical spine within normal limits. Bone marrow signal intensity normal. No acute scalp soft tissue abnormality. Sinuses/Orbits: Globes and orbital soft tissues within normal limits. Moderate mucosal thickening throughout the paranasal sinuses. No air-fluid level to suggest active sinus infection. Bilateral mastoid effusions noted. Inner ear structures grossly normal. IMPRESSION: 1. Overall interval progression of disease as evidenced by worsened T2/FLAIR and diffusion signal abnormality involving the anterior cerebral hemispheres bilaterally, most likely reflecting infiltrative nonenhancing tumor. Enhancing portions of the tumor are relatively similar as compared to most recent MRI from 05/20/2016. 2. No other acute intracranial process identified. 3. Moderate inflammatory paranasal sinus disease with bilateral mastoid effusions. Electronically Signed   By: Jeannine Boga M.D.   On: 07/29/2016 21:25     ASSESSMENT/PLAN:  Glioblastoma multiforme of the brain - S/P removal of glioblastoma with overall interval progression of disease as evidenced by worsening T2/FLAIR and diffusion signal abnormality involving the anterior cerebral hemispheres bilaterally, most likely reflecting infiltrative nonenhancing tumor. Family and patient decided to [facility]; continue Dexamethasone 2 mg PO Q D and Keppra 500 mg give 3 tabs = 1,500 mg PO BID  Hypothyroidism - continue Synthroid 25 g 1/2 tab = 12.5 mg by mouth daily Lab Results  Component Value Date   TSH 1.900 07/30/2016    Chronic depression - continue Zoloft 100 mg give 1 1/2 tab = 150 mg by mouth daily at bedtime  Diabetes mellitus, type II - continue Lantus 100 units/mL inject 5 units subcutaneous daily at bedtime Lab Results  Component Value Date   HGBA1C 6.8 (H) 07/29/2016    Anxiety - mood is stable; continue Ativan 0.5 mg 1 tab by mouth every 6 hours PRN     I have filled out patient's discharge paperworks.   DME provided:  None  Total discharge time: Less than 30 minutes  Discharge time involved coordination of the discharge process with Education officer, museum and nursing staff.    Fia Hebert C. Ideal - NP [employer] [phone]

## 2016-08-15 ENCOUNTER — Other Ambulatory Visit: Payer: Self-pay

## 2016-08-15 ENCOUNTER — Ambulatory Visit: Payer: Self-pay | Admitting: Hematology and Oncology

## 2016-08-15 ENCOUNTER — Ambulatory Visit: Payer: Self-pay

## 2016-10-03 DEATH — deceased

## 2016-12-12 IMAGING — CR DG LUMBAR SPINE 2-3V
3 series · 3 of 3 positions shown · non-contrast
Comparison: None.

CLINICAL DATA: Lumbago with right-sided radicular symptoms.

EXAM:
LUMBAR SPINE - 2-3 VIEW

[t l-spine a.p.]
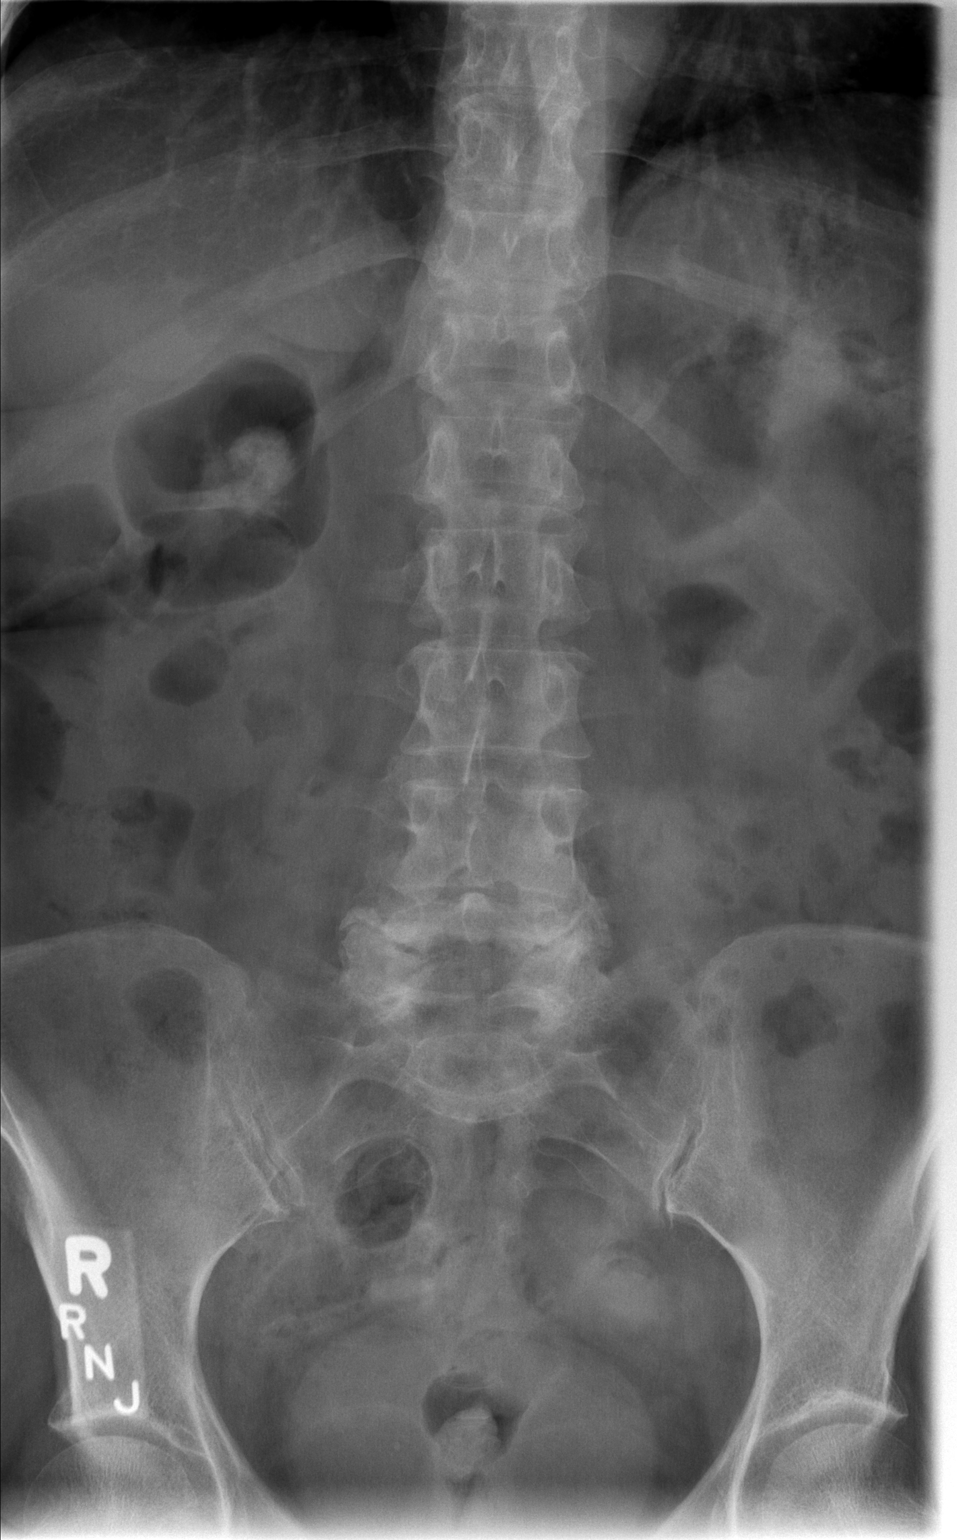

[t l-spine lat]
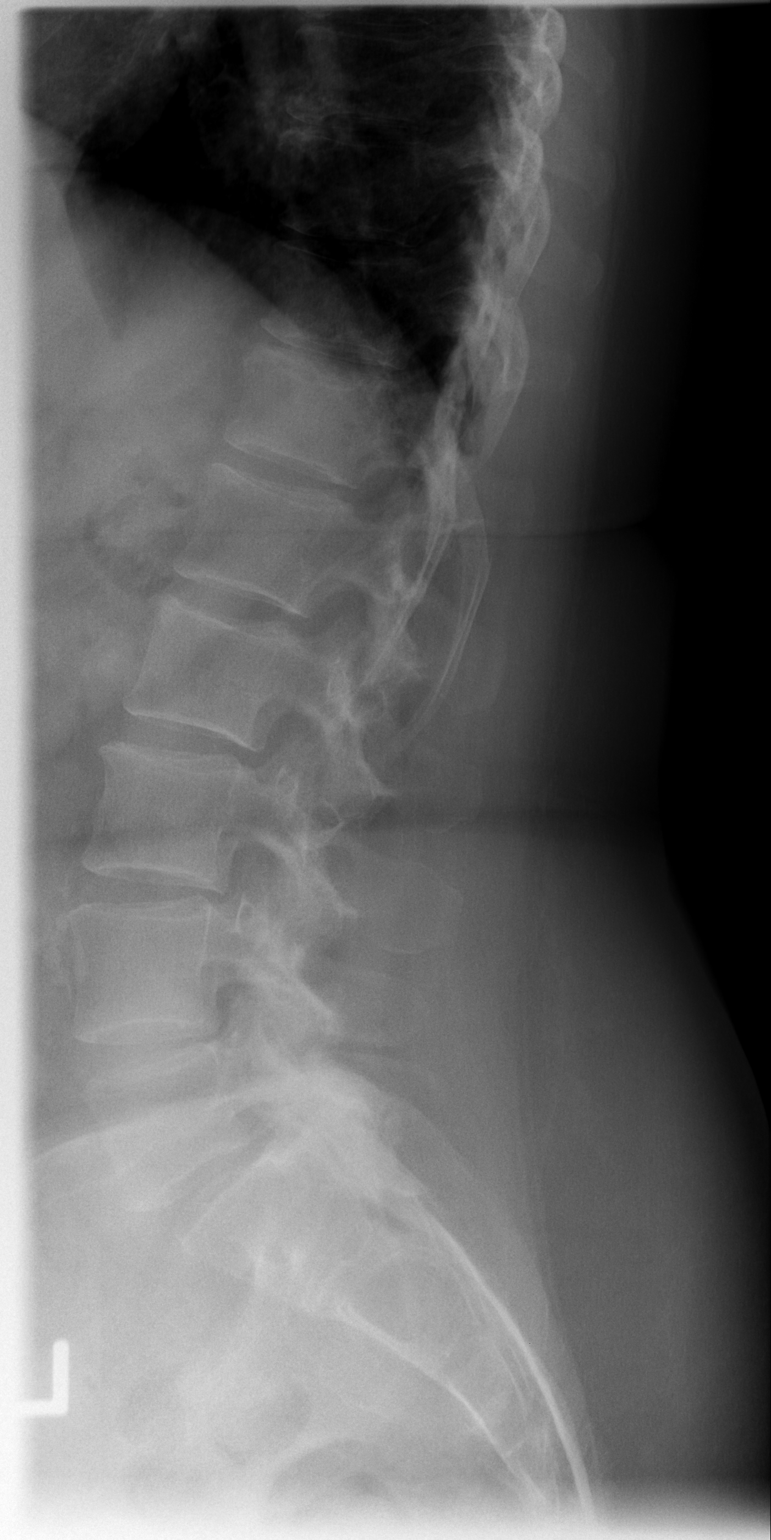

[t l-spine l5-s1 spot]
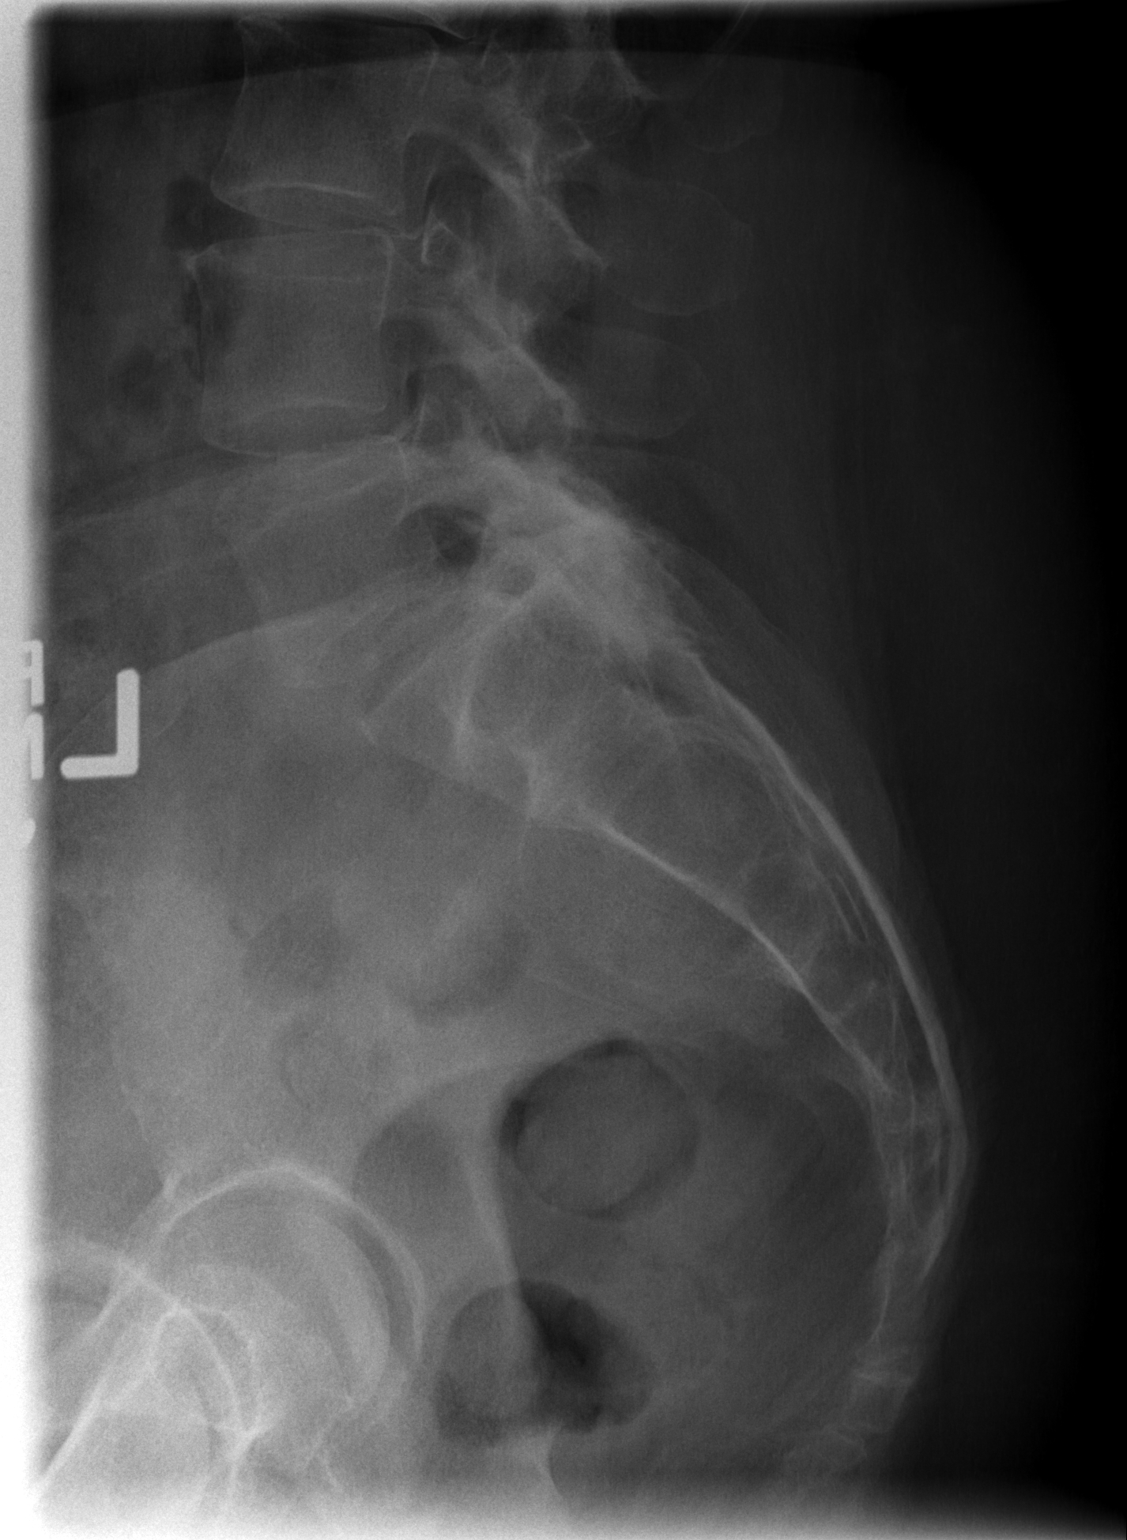

[3 of 3 positions shown; findings below may reference images not displayed]

FINDINGS: Frontal, lateral, and spot lumbosacral lateral images were obtained.
There are 5 non-rib-bearing lumbar type vertebral bodies. There is
no fracture. There is 3 mm of anterolisthesis of L5 on S1. No other
fracture is evident. No spondylolisthesis. There is mild disc space
narrowing at all levels, slightly more L4-5 and elsewhere. There is
extensive facet arthropathy at L5-S1 bilaterally with bony
overgrowth.
IMPRESSION: Lower lumbar arthropathy, most notably at L5-S1. Slight
spondylolisthesis at L5-S1 is felt to be due to underlying
spondylosis. No fracture evident.

## 2016-12-12 IMAGING — CR DG HIP (WITH OR WITHOUT PELVIS) 1V*R*
3 series · 3 of 3 positions shown · non-contrast
Comparison: None.

CLINICAL DATA: Right hip region pain

EXAM:
DG HIP (WITH OR WITHOUT PELVIS) 2+V RIGHT

[t pelvis a.p.]
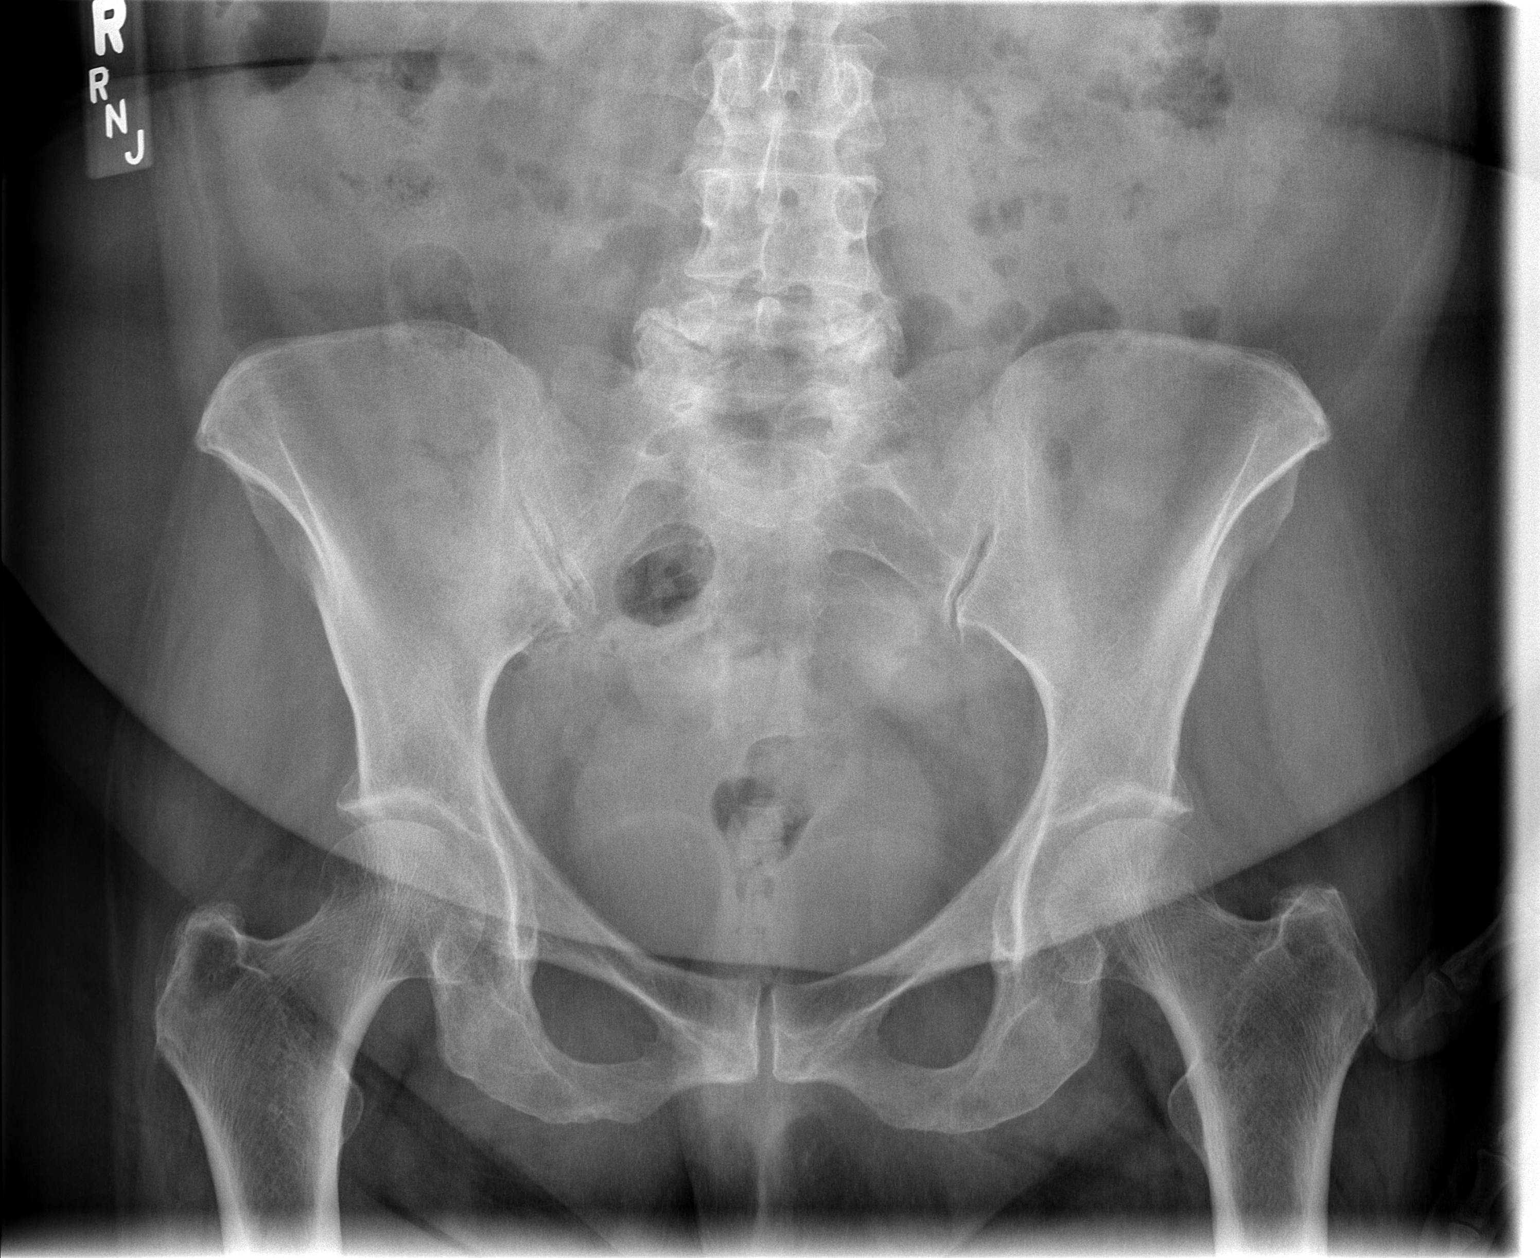

[t hip ap right]
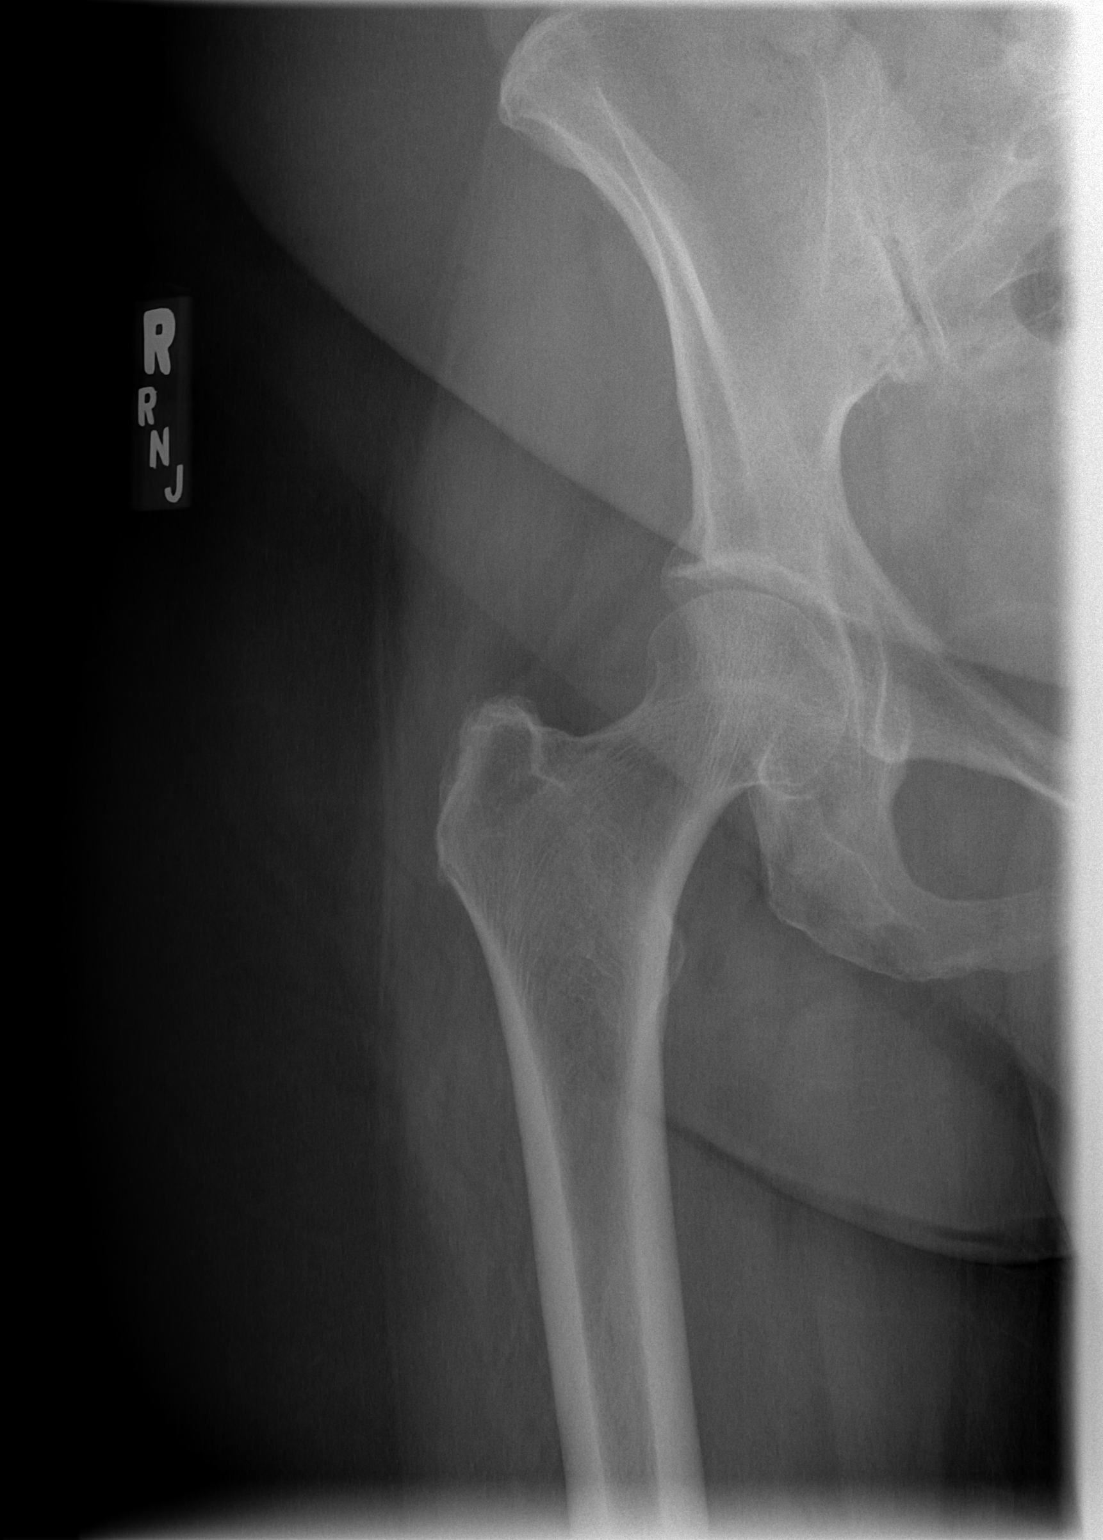

[t hip frog leg right]
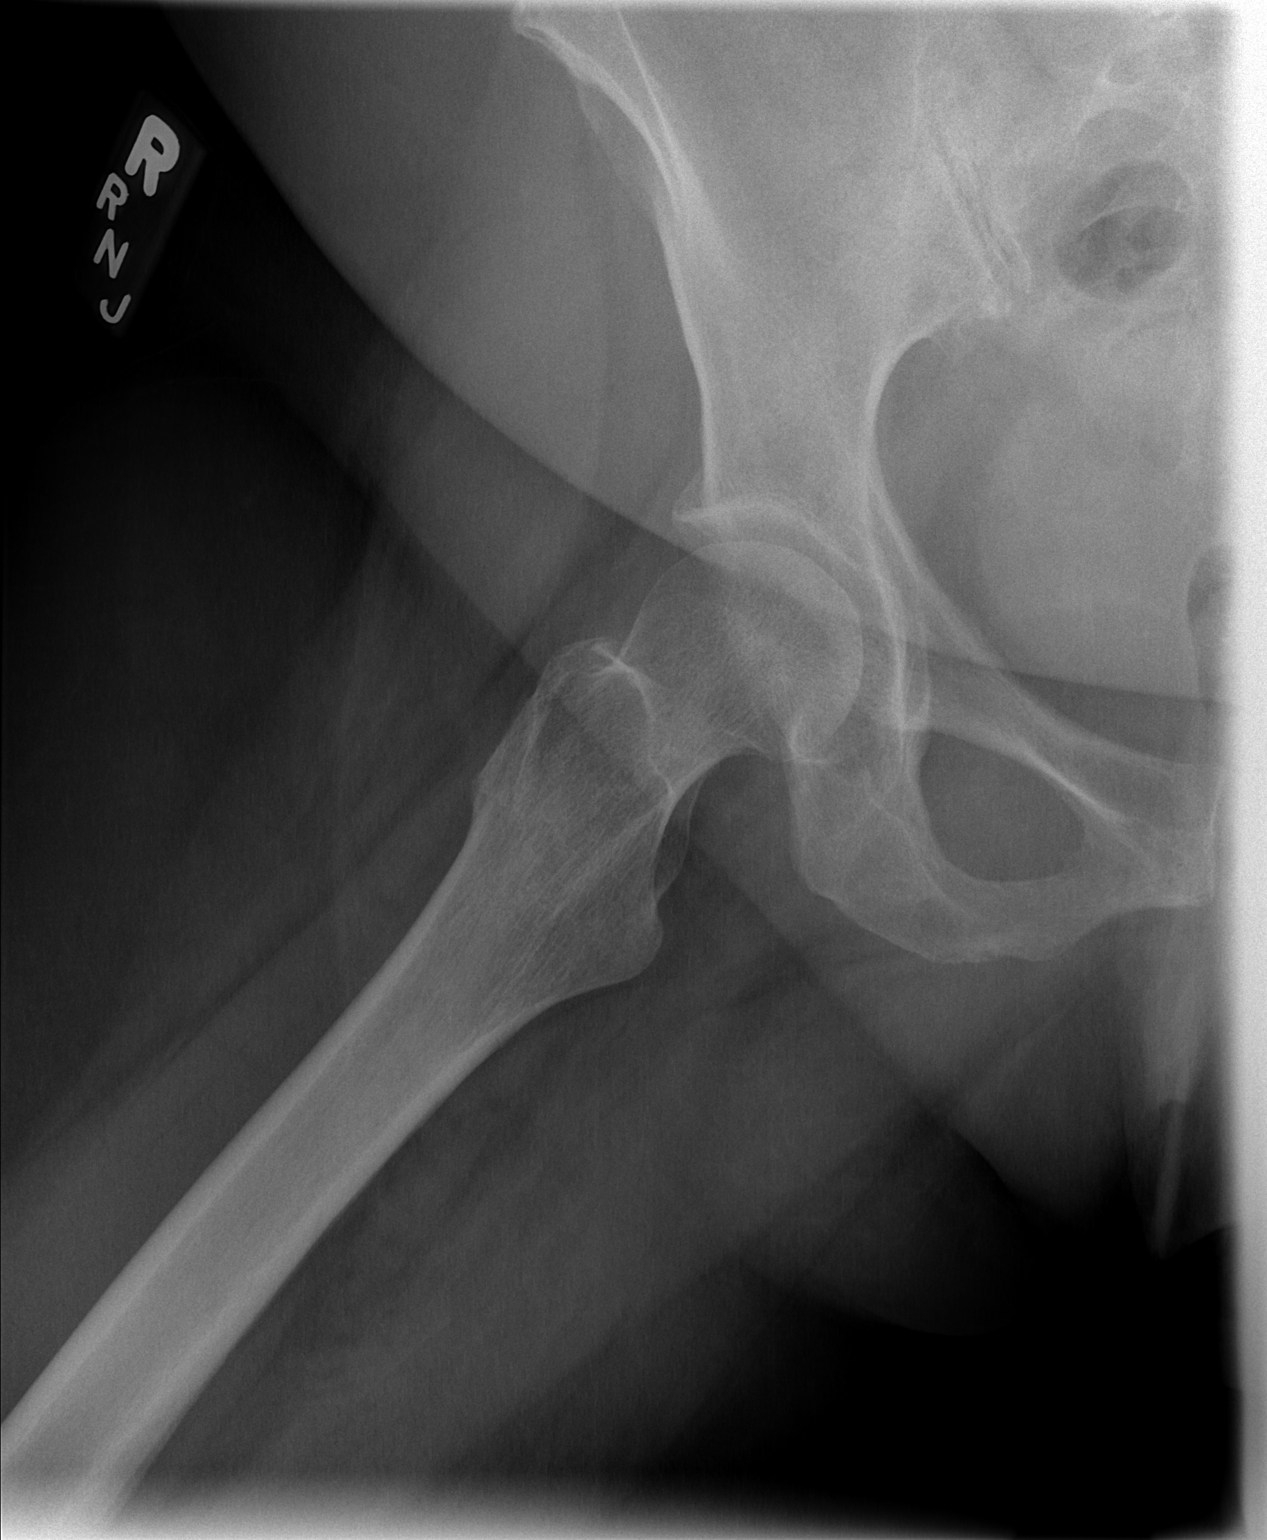

[3 of 3 positions shown; findings below may reference images not displayed]

FINDINGS: Frontal pelvis as well as frontal and lateral right hip images were
obtained. There is no fracture or dislocation. Joint spaces appear
symmetric and within normal limits bilaterally. No erosive change.
IMPRESSION: No fracture or dislocation.  No apparent arthropathy.

## 2018-10-01 ENCOUNTER — Encounter: Payer: Self-pay | Admitting: Hematology and Oncology

## 2018-10-01 ENCOUNTER — Other Ambulatory Visit: Payer: Self-pay | Admitting: Nurse Practitioner
